# Patient Record
Sex: Female | Born: 1940 | Race: Black or African American | Hispanic: No | Marital: Single | Smoking: Never smoker
Health system: Southern US, Community
[De-identification: ages and names within clinical notes are randomized; demographics above are authoritative.]

## PROBLEM LIST (undated history)

## (undated) DIAGNOSIS — G4733 Obstructive sleep apnea (adult) (pediatric): Secondary | ICD-10-CM

## (undated) DIAGNOSIS — E119 Type 2 diabetes mellitus without complications: Secondary | ICD-10-CM

## (undated) DIAGNOSIS — M51369 Other intervertebral disc degeneration, lumbar region without mention of lumbar back pain or lower extremity pain: Secondary | ICD-10-CM

## (undated) DIAGNOSIS — B029 Zoster without complications: Secondary | ICD-10-CM

## (undated) DIAGNOSIS — I5030 Unspecified diastolic (congestive) heart failure: Secondary | ICD-10-CM

## (undated) DIAGNOSIS — E559 Vitamin D deficiency, unspecified: Secondary | ICD-10-CM

## (undated) DIAGNOSIS — I10 Essential (primary) hypertension: Secondary | ICD-10-CM

## (undated) DIAGNOSIS — N183 Chronic kidney disease, stage 3 unspecified: Secondary | ICD-10-CM

## (undated) DIAGNOSIS — I351 Nonrheumatic aortic (valve) insufficiency: Secondary | ICD-10-CM

## (undated) DIAGNOSIS — M109 Gout, unspecified: Secondary | ICD-10-CM

## (undated) DIAGNOSIS — D751 Secondary polycythemia: Secondary | ICD-10-CM

## (undated) DIAGNOSIS — E213 Hyperparathyroidism, unspecified: Secondary | ICD-10-CM

## (undated) DIAGNOSIS — G4730 Sleep apnea, unspecified: Secondary | ICD-10-CM

## (undated) DIAGNOSIS — M1990 Unspecified osteoarthritis, unspecified site: Secondary | ICD-10-CM

## (undated) DIAGNOSIS — I5189 Other ill-defined heart diseases: Secondary | ICD-10-CM

## (undated) DIAGNOSIS — E785 Hyperlipidemia, unspecified: Secondary | ICD-10-CM

## (undated) DIAGNOSIS — I340 Nonrheumatic mitral (valve) insufficiency: Secondary | ICD-10-CM

## (undated) DIAGNOSIS — R413 Other amnesia: Secondary | ICD-10-CM

## (undated) DIAGNOSIS — M47816 Spondylosis without myelopathy or radiculopathy, lumbar region: Secondary | ICD-10-CM

## (undated) DIAGNOSIS — M4310 Spondylolisthesis, site unspecified: Secondary | ICD-10-CM

## (undated) DIAGNOSIS — M5136 Other intervertebral disc degeneration, lumbar region: Secondary | ICD-10-CM

## (undated) DIAGNOSIS — D696 Thrombocytopenia, unspecified: Secondary | ICD-10-CM

## (undated) DIAGNOSIS — H269 Unspecified cataract: Secondary | ICD-10-CM

## (undated) HISTORY — DX: Vitamin D deficiency, unspecified: E55.9

## (undated) HISTORY — PX: LAPAROSCOPIC HYSTERECTOMY: SHX1926

## (undated) HISTORY — DX: Nonrheumatic aortic (valve) insufficiency: I35.1

## (undated) HISTORY — DX: Spondylosis without myelopathy or radiculopathy, lumbar region: M47.816

## (undated) HISTORY — DX: Zoster without complications: B02.9

## (undated) HISTORY — DX: Thrombocytopenia, unspecified: D69.6

## (undated) HISTORY — DX: Sleep apnea, unspecified: G47.30

## (undated) HISTORY — DX: Hyperlipidemia, unspecified: E78.5

## (undated) HISTORY — DX: Chronic kidney disease, stage 3 unspecified: N18.30

## (undated) HISTORY — DX: Secondary polycythemia: D75.1

## (undated) HISTORY — DX: Obstructive sleep apnea (adult) (pediatric): G47.33

## (undated) HISTORY — DX: Other ill-defined heart diseases: I51.89

## (undated) HISTORY — DX: Unspecified diastolic (congestive) heart failure: I50.30

## (undated) HISTORY — DX: Spondylolisthesis, site unspecified: M43.10

## (undated) HISTORY — DX: Other amnesia: R41.3

## (undated) HISTORY — DX: Other intervertebral disc degeneration, lumbar region without mention of lumbar back pain or lower extremity pain: M51.369

## (undated) HISTORY — DX: Nonrheumatic mitral (valve) insufficiency: I34.0

## (undated) HISTORY — DX: Unspecified osteoarthritis, unspecified site: M19.90

## (undated) HISTORY — DX: Hyperparathyroidism, unspecified: E21.3

## (undated) HISTORY — DX: Gout, unspecified: M10.9

## (undated) HISTORY — DX: Other intervertebral disc degeneration, lumbar region: M51.36

## (undated) HISTORY — DX: Unspecified cataract: H26.9

---

## 2008-02-09 LAB — HM COLONOSCOPY

## 2013-03-19 LAB — HM DEXA SCAN

## 2017-08-08 LAB — HM MAMMOGRAPHY

## 2017-11-28 LAB — HM DIABETES EYE EXAM

## 2018-03-24 LAB — BASIC METABOLIC PANEL
BUN: 17 (ref 4–21)
CO2: 27 — AB (ref 13–22)
Creatinine: 1 (ref 0.5–1.1)
Glucose: 89
Potassium: 4.2 (ref 3.4–5.3)
Sodium: 142 (ref 137–147)

## 2018-03-24 LAB — CBC: RBC: 4.21 (ref 3.87–5.11)

## 2018-03-24 LAB — COMPREHENSIVE METABOLIC PANEL
Albumin: 4.4 (ref 3.5–5.0)
Calcium: 9.9 (ref 8.7–10.7)

## 2018-03-24 LAB — LIPID PANEL
Cholesterol: 159 (ref 0–200)
HDL: 68 (ref 35–70)
LDL Cholesterol: 68
Triglycerides: 108 (ref 40–160)

## 2018-03-24 LAB — CBC AND DIFFERENTIAL
HCT: 39 (ref 36–46)
Hemoglobin: 13.3 (ref 12.0–16.0)
Neutrophils Absolute: 2
Platelets: 269 (ref 150–399)
WBC: 5.6

## 2018-03-24 LAB — HEPATIC FUNCTION PANEL
Alkaline Phosphatase: 82 (ref 25–125)
Bilirubin, Direct: 0.3 (ref 0.01–0.4)
Bilirubin, Total: 0.5

## 2018-03-24 LAB — MICROALBUMIN, URINE: Microalb, Ur: 15

## 2018-03-24 LAB — HEMOGLOBIN A1C: Hemoglobin A1C: 6.6

## 2018-11-03 LAB — BASIC METABOLIC PANEL
BUN: 27 — AB (ref 4–21)
CO2: 26 — AB (ref 13–22)
Creatinine: 1.4 — AB (ref 0.5–1.1)
Glucose: 155
Potassium: 4.4 (ref 3.4–5.3)
Sodium: 143 (ref 137–147)

## 2018-11-03 LAB — LIPID PANEL
Cholesterol: 161 (ref 0–200)
HDL: 66 (ref 35–70)
LDL Cholesterol: 72
Triglycerides: 89 (ref 40–160)

## 2018-11-03 LAB — CBC AND DIFFERENTIAL
HCT: 39 (ref 36–46)
HCT: 40 (ref 36–46)
Hemoglobin: 12.9 (ref 12.0–16.0)
Hemoglobin: 13.4 (ref 12.0–16.0)
Neutrophils Absolute: 2
Platelets: 252 (ref 150–399)
Platelets: 252 (ref 150–399)
WBC: 4.7
WBC: 4.7

## 2018-11-03 LAB — CBC
RBC: 4.17 (ref 3.87–5.11)
RBC: 4.17 (ref 3.87–5.11)

## 2018-11-03 LAB — HEMOGLOBIN A1C: Hemoglobin A1C: 6.7

## 2018-11-03 LAB — MICROALBUMIN, URINE: Microalb, Ur: 6.5

## 2018-11-03 LAB — COMPREHENSIVE METABOLIC PANEL
Calcium: 9.9 (ref 8.7–10.7)
GFR calc Af Amer: 45
GFR calc non Af Amer: 37

## 2019-06-29 ENCOUNTER — Encounter: Payer: Self-pay | Admitting: Family

## 2019-07-08 ENCOUNTER — Encounter (HOSPITAL_COMMUNITY): Payer: Self-pay

## 2019-07-08 ENCOUNTER — Emergency Department (HOSPITAL_COMMUNITY)
Admission: EM | Admit: 2019-07-08 | Discharge: 2019-07-08 | Disposition: A | Discharge status: Home / Self Care | Payer: Medicaid Other | Attending: Emergency Medicine | Admitting: Emergency Medicine

## 2019-07-08 ENCOUNTER — Emergency Department (HOSPITAL_COMMUNITY): Payer: Medicaid Other

## 2019-07-08 DIAGNOSIS — E119 Type 2 diabetes mellitus without complications: Secondary | ICD-10-CM | POA: Insufficient documentation

## 2019-07-08 DIAGNOSIS — T7840XA Allergy, unspecified, initial encounter: Secondary | ICD-10-CM

## 2019-07-08 DIAGNOSIS — R21 Rash and other nonspecific skin eruption: Secondary | ICD-10-CM | POA: Diagnosis present

## 2019-07-08 DIAGNOSIS — I10 Essential (primary) hypertension: Secondary | ICD-10-CM | POA: Diagnosis not present

## 2019-07-08 DIAGNOSIS — R0602 Shortness of breath: Secondary | ICD-10-CM | POA: Insufficient documentation

## 2019-07-08 HISTORY — DX: Type 2 diabetes mellitus without complications: E11.9

## 2019-07-08 HISTORY — DX: Essential (primary) hypertension: I10

## 2019-07-08 LAB — CBC WITH DIFFERENTIAL/PLATELET
Abs Immature Granulocytes: 0.03 10*3/uL (ref 0.00–0.07)
Basophils Absolute: 0 10*3/uL (ref 0.0–0.1)
Basophils Relative: 0 %
Eosinophils Absolute: 0.3 10*3/uL (ref 0.0–0.5)
Eosinophils Relative: 3 %
HCT: 39.6 % (ref 36.0–46.0)
Hemoglobin: 12.9 g/dL (ref 12.0–15.0)
Immature Granulocytes: 0 %
Lymphocytes Relative: 22 %
Lymphs Abs: 2.2 10*3/uL (ref 0.7–4.0)
MCH: 31.1 pg (ref 26.0–34.0)
MCHC: 32.6 g/dL (ref 30.0–36.0)
MCV: 95.4 fL (ref 80.0–100.0)
Monocytes Absolute: 0.7 10*3/uL (ref 0.1–1.0)
Monocytes Relative: 7 %
Neutro Abs: 7.2 10*3/uL (ref 1.7–7.7)
Neutrophils Relative %: 68 %
Platelets: 283 10*3/uL (ref 150–400)
RBC: 4.15 MIL/uL (ref 3.87–5.11)
RDW: 12.4 % (ref 11.5–15.5)
WBC: 10.4 10*3/uL (ref 4.0–10.5)
nRBC: 0 % (ref 0.0–0.2)

## 2019-07-08 LAB — BASIC METABOLIC PANEL
Anion gap: 9 (ref 5–15)
BUN: 31 mg/dL — ABNORMAL HIGH (ref 8–23)
CO2: 22 mmol/L (ref 22–32)
Calcium: 9.3 mg/dL (ref 8.9–10.3)
Chloride: 111 mmol/L (ref 98–111)
Creatinine, Ser: 1.28 mg/dL — ABNORMAL HIGH (ref 0.44–1.00)
GFR calc Af Amer: 45 mL/min — ABNORMAL LOW (ref >60)
GFR calc non Af Amer: 39 mL/min — ABNORMAL LOW (ref >60)
Glucose, Bld: 148 mg/dL — ABNORMAL HIGH (ref 70–99)
Potassium: 4.5 mmol/L (ref 3.5–5.1)
Sodium: 142 mmol/L (ref 135–145)

## 2019-07-08 LAB — BRAIN NATRIURETIC PEPTIDE: B Natriuretic Peptide: 31.5 pg/mL (ref 0.0–100.0)

## 2019-07-08 MED ORDER — DIPHENHYDRAMINE HCL 50 MG/ML IJ SOLN
25.0000 mg | Freq: Once | INTRAMUSCULAR | Status: AC
  Administered 2019-07-08: 25 mg via INTRAVENOUS
  Filled 2019-07-08: qty 1

## 2019-07-08 MED ORDER — METHYLPREDNISOLONE SODIUM SUCC 125 MG IJ SOLR
125.0000 mg | Freq: Once | INTRAMUSCULAR | Status: AC
  Administered 2019-07-08: 125 mg via INTRAVENOUS
  Filled 2019-07-08: qty 2

## 2019-07-08 MED ORDER — SODIUM CHLORIDE 0.9 % IV SOLN: INTRAVENOUS | Status: DC

## 2019-07-08 MED ORDER — FAMOTIDINE IN NACL 20-0.9 MG/50ML-% IV SOLN
20.0000 mg | Freq: Once | INTRAVENOUS | Status: AC
  Administered 2019-07-08: 20 mg via INTRAVENOUS
  Filled 2019-07-08: qty 50

## 2019-07-08 MED ORDER — DIPHENHYDRAMINE HCL 50 MG/ML IJ SOLN
12.5000 mg | Freq: Once | INTRAMUSCULAR | Status: AC
  Administered 2019-07-08: 12.5 mg via INTRAVENOUS
  Filled 2019-07-08: qty 1

## 2019-07-08 MED ORDER — ACETAMINOPHEN 325 MG PO TABS
650.0000 mg | ORAL_TABLET | Freq: Once | ORAL | Status: AC
  Administered 2019-07-08: 650 mg via ORAL
  Filled 2019-07-08: qty 2

## 2019-07-08 MED ORDER — PREDNISONE 50 MG PO TABS
ORAL_TABLET | ORAL | 0 refills | Status: DC
Start: 2019-07-08 — End: 2019-08-06

## 2019-07-08 NOTE — Discharge Instructions (Signed)
Use Benadryl as directed for itching.  Stop taking the Augmentin.

## 2019-07-08 NOTE — ED Triage Notes (Addendum)
Pt sts allergic reaction to unknown substance. Worse on back and burning. Only allergy to shellfish. Hx of shingles.

## 2019-07-08 NOTE — ED Provider Notes (Signed)
[facility] DEPT Provider Note   CSN: [id number] Arrival date & time: 07/08/19  2029     History Chief Complaint  Patient presents with  . Allergic Reaction    Michelle Rowe is a 79 y.o. female.  79 year old female presents with rash x24 hours.  Patient was started on Augmentin 8 days ago for respiratory infection.  Denies any oral involvement.  Rash is pruritic and does burn.  No treatment use prior to arrival.  No other chemicals used.  Rashes on her face as well as her trunk and extremities.  No involvement of the palms of her hands or soles of her feet.  No prior history of same        Past Medical History:  Diagnosis Date  . Diabetes mellitus without complication ([place])   . Hypertension     There are no problems to display for this patient.      OB History   No obstetric history on file.     No family history on file.  Social History   Tobacco Use  . Smoking status: Never Smoker  . Smokeless tobacco: Never Used  Substance Use Topics  . Alcohol use: Not Currently  . Drug use: Not Currently    Home Medications Prior to Admission medications   Not on File    Allergies    Shellfish allergy  Review of Systems   Review of Systems  All other systems reviewed and are negative.   Physical Exam Updated Vital Signs BP (!) 140/92 (BP Location: Right Arm)   Pulse 70   Temp 99.2 F (37.3 C) (Oral)   Resp 18   Ht 1.651 m (5\' 5" )   Wt 104.3 kg   SpO2 98%   BMI 38.27 kg/m   Physical Exam Vitals and nursing note reviewed.  Constitutional:      General: She is not in acute distress.    Appearance: Normal appearance. She is well-developed. She is not toxic-appearing.  HENT:     Head: Normocephalic and atraumatic.  Eyes:     General: Lids are normal.     Conjunctiva/sclera: Conjunctivae normal.     Pupils: Pupils are equal, round, and reactive to light.  Neck:     Thyroid: No thyroid mass.     Trachea: No tracheal  deviation.  Cardiovascular:     Rate and Rhythm: Normal rate and regular rhythm.     Heart sounds: Normal heart sounds. No murmur. No gallop.   Pulmonary:     Effort: Pulmonary effort is normal. No respiratory distress.     Breath sounds: Normal breath sounds. No stridor. No decreased breath sounds, wheezing, rhonchi or rales.  Abdominal:     General: Bowel sounds are normal. There is no distension.     Palpations: Abdomen is soft.     Tenderness: There is no abdominal tenderness. There is no rebound.  Musculoskeletal:        General: No tenderness. Normal range of motion.     Cervical back: Normal range of motion and neck supple.  Skin:    General: Skin is warm and dry.     Findings: Rash present. No abrasion or petechiae. Rash is urticarial. Rash is not vesicular.  Neurological:     Mental Status: She is alert and oriented to person, place, and time.     GCS: GCS eye subscore is 4. GCS verbal subscore is 5. GCS motor subscore is 6.     Cranial Nerves:  No cranial nerve deficit.     Sensory: No sensory deficit.  Psychiatric:        Speech: Speech normal.        Behavior: Behavior normal.     ED Results / Procedures / Treatments   Labs (all labs ordered are listed, but only abnormal results are displayed) Labs Reviewed  CBC WITH DIFFERENTIAL/PLATELET  BASIC METABOLIC PANEL  BRAIN NATRIURETIC PEPTIDE    EKG None  Radiology No results found.  Procedures Procedures (including critical care time)  Medications Ordered in ED Medications  0.9 %  sodium chloride infusion (has no administration in time range)  diphenhydrAMINE (BENADRYL) injection 25 mg (has no administration in time range)  methylPREDNISolone sodium succinate (SOLU-MEDROL) 125 mg/2 mL injection 125 mg (has no administration in time range)  famotidine (PEPCID) IVPB 20 mg premix (has no administration in time range)    ED Course  I have reviewed the triage vital signs and the nursing notes.  Pertinent  labs & imaging results that were available during my care of the patient were reviewed by me and considered in my medical decision making (see chart for details).    MDM Rules/Calculators/A&P                      Patient treated for allergic reaction here and feels better.  Rash is greatly improved.  No evidence of decompensated CHF at this time.  BMP is normal.  Patient is Augmentin likely etiology of her symptoms and that has been discontinued.  No indication to restart a different antibiotic as she has no respiratory complaints at this time.  Will scribe 2 more days of steroids as well as encourage patient use as needed Benadryl and Pepcid.  Will give primary care referral.  CRITICAL CARE Performed by: Leota Jacobsen Total critical care time: 50 minutes Critical care time was exclusive of separately billable procedures and treating other patients. Critical care was necessary to treat or prevent imminent or life-threatening deterioration. Critical care was time spent personally by me on the following activities: development of treatment plan with patient and/or surrogate as well as nursing, discussions with consultants, evaluation of patient's response to treatment, examination of patient, obtaining history from patient or surrogate, ordering and performing treatments and interventions, ordering and review of laboratory studies, ordering and review of radiographic studies, pulse oximetry and re-evaluation of patient's condition.  Final Clinical Impression(s) / ED Diagnoses Final diagnoses:  None    Rx / DC Orders ED Discharge Orders    None       Lacretia Leigh, MD 07/08/19 2315

## 2019-07-14 ENCOUNTER — Encounter: Payer: Self-pay | Admitting: Family

## 2019-07-14 LAB — COMPREHENSIVE METABOLIC PANEL
Albumin: 3.7 (ref 3.5–5.0)
Calcium: 9.9 (ref 8.7–10.7)
GFR calc Af Amer: 50
GFR calc non Af Amer: 41

## 2019-07-14 LAB — LIPID PANEL
Cholesterol: 154 (ref 0–200)
HDL: 75 — AB (ref 35–70)
LDL Cholesterol: 60
Triglycerides: 74 (ref 40–160)

## 2019-07-14 LAB — CBC AND DIFFERENTIAL
HCT: 40 (ref 36–46)
Hemoglobin: 13.4 (ref 12.0–16.0)
Neutrophils Absolute: 5
Platelets: 304 (ref 150–399)
WBC: 9.6

## 2019-07-14 LAB — BASIC METABOLIC PANEL
BUN: 35 — AB (ref 4–21)
CO2: 24 — AB (ref 13–22)
Chloride: 107 (ref 99–108)
Creatinine: 1.3 — AB (ref 0.5–1.1)
Glucose: 122
Potassium: 4.7 (ref 3.4–5.3)
Sodium: 141 (ref 137–147)

## 2019-07-14 LAB — HEPATIC FUNCTION PANEL
Alkaline Phosphatase: 78 (ref 25–125)
Bilirubin, Direct: 0 — AB (ref 0.01–0.4)
Bilirubin, Total: 0.5

## 2019-07-14 LAB — CBC: RBC: 4.4 (ref 3.87–5.11)

## 2019-07-14 LAB — MICROALBUMIN, URINE: Microalb, Ur: 7.9

## 2019-08-06 ENCOUNTER — Encounter: Payer: Self-pay | Admitting: Podiatry

## 2019-08-06 ENCOUNTER — Ambulatory Visit (INDEPENDENT_AMBULATORY_CARE_PROVIDER_SITE_OTHER): Payer: Medicaid Other

## 2019-08-06 ENCOUNTER — Ambulatory Visit (INDEPENDENT_AMBULATORY_CARE_PROVIDER_SITE_OTHER): Payer: Medicaid Other | Admitting: Podiatry

## 2019-08-06 ENCOUNTER — Other Ambulatory Visit: Payer: Self-pay

## 2019-08-06 ENCOUNTER — Ambulatory Visit: Payer: Self-pay

## 2019-08-06 DIAGNOSIS — M7751 Other enthesopathy of right foot: Secondary | ICD-10-CM

## 2019-08-06 DIAGNOSIS — E139 Other specified diabetes mellitus without complications: Secondary | ICD-10-CM | POA: Diagnosis not present

## 2019-08-06 DIAGNOSIS — M779 Enthesopathy, unspecified: Secondary | ICD-10-CM

## 2019-08-06 DIAGNOSIS — M7752 Other enthesopathy of left foot: Secondary | ICD-10-CM

## 2019-08-06 DIAGNOSIS — R52 Pain, unspecified: Secondary | ICD-10-CM | POA: Diagnosis not present

## 2019-08-06 NOTE — Patient Instructions (Signed)
Diabetes Mellitus and Foot Care Foot care is an important part of your health, especially when you have diabetes. Diabetes may cause you to have problems because of poor blood flow (circulation) to your feet and legs, which can cause your skin to:  Become thinner and drier.  Break more easily.  Heal more slowly.  Peel and crack. You may also have nerve damage (neuropathy) in your legs and feet, causing decreased feeling in them. This means that you may not notice minor injuries to your feet that could lead to more serious problems. Noticing and addressing any potential problems early is the best way to prevent future foot problems. How to care for your feet Foot hygiene  Wash your feet daily with warm water and mild soap. Do not use hot water. Then, pat your feet and the areas between your toes until they are completely dry. Do not soak your feet as this can dry your skin.  Trim your toenails straight across. Do not dig under them or around the cuticle. File the edges of your nails with an emery board or nail file.  Apply a moisturizing lotion or petroleum jelly to the skin on your feet and to dry, brittle toenails. Use lotion that does not contain alcohol and is unscented. Do not apply lotion between your toes. Shoes and socks  Wear clean socks or stockings every day. Make sure they are not too tight. Do not wear knee-high stockings since they may decrease blood flow to your legs.  Wear shoes that fit properly and have enough cushioning. Always look in your shoes before you put them on to be sure there are no objects inside.  To break in new shoes, wear them for just a few hours a day. This prevents injuries on your feet. Wounds, scrapes, corns, and calluses  Check your feet daily for blisters, cuts, bruises, sores, and redness. If you cannot see the bottom of your feet, use a mirror or ask someone for help.  Do not cut corns or calluses or try to remove them with medicine.  If you  find a minor scrape, cut, or break in the skin on your feet, keep it and the skin around it clean and dry. You may clean these areas with mild soap and water. Do not clean the area with peroxide, alcohol, or iodine.  If you have a wound, scrape, corn, or callus on your foot, look at it several times a day to make sure it is healing and not infected. Check for: ? Redness, swelling, or pain. ? Fluid or blood. ? Warmth. ? Pus or a bad smell. General instructions  Do not cross your legs. This may decrease blood flow to your feet.  Do not use heating pads or hot water bottles on your feet. They may burn your skin. If you have lost feeling in your feet or legs, you may not know this is happening until it is too late.  Protect your feet from hot and cold by wearing shoes, such as at the beach or on hot pavement.  Schedule a complete foot exam at least once a year (annually) or more often if you have foot problems. If you have foot problems, report any cuts, sores, or bruises to your health care provider immediately. Contact a health care provider if:  You have a medical condition that increases your risk of infection and you have any cuts, sores, or bruises on your feet.  You have an injury that is not   healing.  You have redness on your legs or feet.  You feel burning or tingling in your legs or feet.  You have pain or cramps in your legs and feet.  Your legs or feet are numb.  Your feet always feel cold.  You have pain around a toenail. Get help right away if:  You have a wound, scrape, corn, or callus on your foot and: ? You have pain, swelling, or redness that gets worse. ? You have fluid or blood coming from the wound, scrape, corn, or callus. ? Your wound, scrape, corn, or callus feels warm to the touch. ? You have pus or a bad smell coming from the wound, scrape, corn, or callus. ? You have a fever. ? You have a red line going up your leg. Summary  Check your feet every day  for cuts, sores, red spots, swelling, and blisters.  Moisturize feet and legs daily.  Wear shoes that fit properly and have enough cushioning.  If you have foot problems, report any cuts, sores, or bruises to your health care provider immediately.  Schedule a complete foot exam at least once a year (annually) or more often if you have foot problems. This information is not intended to replace advice given to you by your health care provider. Make sure you discuss any questions you have with your health care provider. Document Revised: 10/18/2018 Document Reviewed: 02/27/2016 Elsevier Patient Education  2020 [employer]  

## 2019-08-06 NOTE — Progress Notes (Signed)
   Subjective:    Patient ID: Michelle Rowe, female    DOB: 1940/07/08, 79 y.o.   MRN: [id number]  HPI    Review of Systems  All other systems reviewed and are negative.      Objective:   Physical Exam        Assessment & Plan:

## 2019-08-07 ENCOUNTER — Other Ambulatory Visit: Payer: Self-pay | Admitting: Podiatry

## 2019-08-07 DIAGNOSIS — M779 Enthesopathy, unspecified: Secondary | ICD-10-CM

## 2019-08-09 NOTE — Progress Notes (Signed)
Subjective:   Patient ID: Michelle Rowe, female   DOB: 79 y.o.   MRN: [id number]   HPI patient presents stating I had quite a bit of pain in my ankles of both feet and it makes it hard for me to be comfortable.  I do get some pain in my heels I get some dry skin and my nails are thick and I do have diabetes under reasonably good control.  Patient does not currently smoke and would like to be more active    Review of Systems  All other systems reviewed and are negative.       Objective:  Physical Exam Vitals and nursing note reviewed.  Constitutional:      Appearance: She is well-developed.  Pulmonary:     Effort: Pulmonary effort is normal.  Musculoskeletal:        General: Normal range of motion.  Skin:    General: Skin is warm.  Neurological:     Mental Status: She is alert.     Neurovascular status intact muscle strength was found to be moderately diminished with range of motion diminished subtalar midtarsal joint.  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

## 2019-08-21 ENCOUNTER — Ambulatory Visit: Payer: Medicaid Other | Admitting: Orthotics

## 2019-09-12 ENCOUNTER — Other Ambulatory Visit: Payer: Self-pay | Admitting: Geriatric Medicine

## 2019-09-12 DIAGNOSIS — Z1231 Encounter for screening mammogram for malignant neoplasm of breast: Secondary | ICD-10-CM

## 2019-09-21 ENCOUNTER — Encounter: Payer: Self-pay | Admitting: Family

## 2019-09-21 ENCOUNTER — Ambulatory Visit
Admission: RE | Admit: 2019-09-21 | Discharge: 2019-09-21 | Disposition: A | Discharge status: Home / Self Care | Payer: Medicaid Other | Source: Ambulatory Visit | Attending: Geriatric Medicine | Admitting: Geriatric Medicine

## 2019-09-21 ENCOUNTER — Ambulatory Visit (INDEPENDENT_AMBULATORY_CARE_PROVIDER_SITE_OTHER): Payer: Medicaid Other | Admitting: Family

## 2019-09-21 ENCOUNTER — Ambulatory Visit
Admission: RE | Admit: 2019-09-21 | Discharge: 2019-09-21 | Disposition: A | Discharge status: Home / Self Care | Payer: Medicaid Other | Source: Ambulatory Visit | Attending: Family | Admitting: Family

## 2019-09-21 ENCOUNTER — Other Ambulatory Visit: Payer: Self-pay

## 2019-09-21 VITALS — BP 138/86 | HR 63 | Temp 96.8°F | Resp 18 | Ht 64.0 in | Wt 216.0 lb

## 2019-09-21 DIAGNOSIS — Z1231 Encounter for screening mammogram for malignant neoplasm of breast: Secondary | ICD-10-CM

## 2019-09-21 DIAGNOSIS — E1121 Type 2 diabetes mellitus with diabetic nephropathy: Secondary | ICD-10-CM

## 2019-09-21 DIAGNOSIS — E785 Hyperlipidemia, unspecified: Secondary | ICD-10-CM | POA: Diagnosis not present

## 2019-09-21 DIAGNOSIS — R413 Other amnesia: Secondary | ICD-10-CM

## 2019-09-21 DIAGNOSIS — I110 Hypertensive heart disease with heart failure: Secondary | ICD-10-CM

## 2019-09-21 DIAGNOSIS — I5032 Chronic diastolic (congestive) heart failure: Secondary | ICD-10-CM

## 2019-09-21 DIAGNOSIS — G8929 Other chronic pain: Secondary | ICD-10-CM

## 2019-09-21 DIAGNOSIS — M159 Polyosteoarthritis, unspecified: Secondary | ICD-10-CM

## 2019-09-21 DIAGNOSIS — R399 Unspecified symptoms and signs involving the genitourinary system: Secondary | ICD-10-CM

## 2019-09-21 DIAGNOSIS — M8949 Other hypertrophic osteoarthropathy, multiple sites: Secondary | ICD-10-CM

## 2019-09-21 DIAGNOSIS — M25561 Pain in right knee: Secondary | ICD-10-CM

## 2019-09-21 DIAGNOSIS — N1831 Chronic kidney disease, stage 3a: Secondary | ICD-10-CM

## 2019-09-21 LAB — POCT URINALYSIS DIPSTICK
Bilirubin, UA: NEGATIVE
Blood, UA: NEGATIVE
Glucose, UA: NEGATIVE
Ketones, UA: NEGATIVE
Nitrite, UA: NEGATIVE
Protein, UA: NEGATIVE
Spec Grav, UA: 1.01 (ref 1.010–1.025)
Urobilinogen, UA: 0.2 E.U./dL
pH, UA: 6 (ref 5.0–8.0)

## 2019-09-21 MED ORDER — LANCETS ULTRA THIN 30G MISC: 1.0000 | Freq: Every day | 3 refills | Status: DC

## 2019-09-21 MED ORDER — LORATADINE 10 MG PO TABS
10.0000 mg | ORAL_TABLET | Freq: Every day | ORAL | 11 refills | Status: DC
Start: 2019-09-21 — End: 2019-12-06

## 2019-09-21 MED ORDER — GLUCOSE BLOOD VI STRP: 1.0000 | ORAL_STRIP | 3 refills | Status: DC | PRN

## 2019-09-21 NOTE — Patient Instructions (Signed)
[facility] for right knee X-ray

## 2019-09-21 NOTE — Progress Notes (Signed)
Provider: Marlowe Sax FNP-C   Eivan Gallina, [name], NP  Patient Care Team: Darvell Monteforte, [name], NP as PCP - General (Family Medicine)  Extended Emergency Contact Information Primary Emergency Contact: [place], [place] Home Phone: [phone] Mobile Phone: [phone] Relation: Daughter Secondary Emergency Contact: [place], [place] Phone: [phone] Mobile Phone: [phone] Relation: Daughter  Code Status: Full code  Goals of care: Advanced Directive information Advanced Directives 09/21/2019  Does Patient Have a Medical Advance Directive? No  Would patient like information on creating a medical advance directive? No - Patient declined     Chief Complaint  Patient presents with   Establish Care    New Patient.     HPI:  Pt is a 79 y.o. female seen today to establish care for medical management of chronic diseases.she is here with daughter who provides HPI information.she hardly filled out the admission packet with medical history.additional information obtained from available medical records.she has a medical history of Hypertension,Type 2 Diabetes Mellitus,diastolic Congestive Heart Failure EF 60 -65% ,Hyperlipidemia,CKD stage 3,Hyperparathyroid,DDD,Osteoarthritis right hip,right knee gout,Memory loss ,Mitral valve regurgitation among other conditions.   She complains of cough for about one week.Has had some occasional chest pain which does not last long.pain does not radiate to arm,jaw or back when she had it .No recent chest pain reported.she denies any fever,chills, shortness of breath,wheezing,edema or recent contact with sick person with COVID-19.she has completed her two doses of Moderna vaccine.  Also complains of burning and frequent urination.daughter states has strong urine odor.she does not drink enough water though daughter not sure how much she should drink due to her congestive heart failure.    Daughter more concerned of patient's worsening memory loss.States  patient was forgetting to take her medication ,misplaces her things then will blame on other people saying they still her staff.she argues with daughter.No wondering or aggressive behaviors reported.she does own activity of daily living except family assist with cooking.States had an MRI with Neurologist 2 months ago.No records for review.Daughter would like her to establish care with Neurologist here in [place].No fall episode reported.  Has not checked her blood sugars daughter recalls CBG in the 110's the last time she checked.she request refills for lancets and strips. Daughter will take charge of medication management and CBG monitoring.Follows up with podiatrist gets injection.she due for annual eye exam.Need referral to Ophthalmology.  No home blood pressure readings for review.   Past Medical History:  Diagnosis Date   Anterolisthesis    Grade 1 of L3 on L4 on x-ray 07/18/19   Aortic regurgitation    Seen on 2D echo 07/07/16   CKD (chronic kidney disease), stage III    DDD (degenerative disc disease), lumbar    Diabetes mellitus without complication (HCC)    Diastolic CHF (HCC)    Grade 2/4 with LVEF of 39-76%   Diastolic dysfunction    Grade 2/4 with LVEF of 60-65% and LVH   Gout    HLD (hyperlipidemia)    Hyperparathyroidism (HCC)    Hypertension    Lumbar facet arthropathy    Memory impairment    Mitral valve regurgitation    Seen on 2D echo 07/07/16   OA (osteoarthritis)    OSA (obstructive sleep apnea)    Polycythemia    Shingles    Thrombocytopenia (HCC)    Vitamin D deficiency    Past Surgical History:  Procedure Laterality Date   LAPAROSCOPIC HYSTERECTOMY      Allergies  Allergen Reactions   Augmentin [Amoxicillin-Pot Clavulanate]  Rash    Critically high - Onset 07/13/2019   Amoxicillin     Did it involve swelling of the face/tongue/throat, SOB, or low BP? Y Did it involve sudden or severe rash/hives, skin peeling, or any reaction on the  inside of your mouth or nose? Y (Rash, Swelling, SOB) Did you need to seek medical attention at a hospital or doctor's office? Y When did it last happen?Yesterday If all above answers are NO, may proceed with cephalosporin use.    Shellfish Allergy     Allergies as of 09/21/2019      Reactions   Augmentin [amoxicillin-pot Clavulanate] Rash   Critically high - Onset 07/13/2019   Amoxicillin    Did it involve swelling of the face/tongue/throat, SOB, or low BP? Y Did it involve sudden or severe rash/hives, skin peeling, or any reaction on the inside of your mouth or nose? Y (Rash, Swelling, SOB) Did you need to seek medical attention at a hospital or doctor's office? Y When did it last happen?Yesterday If all above answers are NO, may proceed with cephalosporin use.   Shellfish Allergy       Medication List       Accurate as of September 21, 2019  2:05 PM. If you have any questions, ask your nurse or doctor.        allopurinol 300 MG tablet Commonly known as: ZYLOPRIM Take 300 mg by mouth daily.   amLODipine 10 MG tablet Commonly known as: NORVASC Take 10 mg by mouth daily.   aspirin 81 MG chewable tablet Chew 81 mg by mouth daily.   fluticasone 50 MCG/ACT nasal spray Commonly known as: FLONASE Place 1 spray into both nostrils 2 (two) times daily as needed for allergies.   glipiZIDE 5 MG tablet Commonly known as: GLUCOTROL Take 5 mg by mouth daily.   glucose blood test strip 1 each by Other route as needed for other. Use as instructed   hydrALAZINE 25 MG tablet Commonly known as: APRESOLINE Take 25 mg by mouth 3 (three) times daily.   Lancets Ultra Thin 30G Misc Inject 1 each into the skin daily.   lisinopril 20 MG tablet Commonly known as: ZESTRIL Take 20 mg by mouth 2 (two) times daily.   metFORMIN 500 MG tablet Commonly known as: GLUCOPHAGE Take 500 mg by mouth daily.   multivitamin capsule Take 1 capsule by mouth daily.   simvastatin 20  MG tablet Commonly known as: ZOCOR Take 20 mg by mouth daily at 6 PM.       Review of Systems  Constitutional: Negative for appetite change, chills, fatigue and fever.  HENT: Positive for congestion and rhinorrhea. Negative for hearing loss, sinus pressure, sinus pain, sneezing, sore throat and trouble swallowing.   Eyes: Negative for discharge, redness and itching.  Respiratory: Positive for cough. Negative for chest tightness, shortness of breath and wheezing.   Cardiovascular: Negative for chest pain, palpitations and leg swelling.  Gastrointestinal: Negative for abdominal distention, abdominal pain, constipation, diarrhea, nausea and vomiting.  Endocrine: Negative for cold intolerance, heat intolerance, polydipsia, polyphagia and polyuria.  Genitourinary: Positive for frequency. Negative for difficulty urinating, dysuria, flank pain and urgency.       Burning with urination and strong urine odor  Musculoskeletal: Positive for arthralgias. Negative for gait problem and joint swelling.       Right knee pain with hx arthritis and gout affecting her walking   Skin: Negative for color change, pallor and rash.  Neurological: Negative for  dizziness, speech difficulty, weakness, light-headedness, numbness and headaches.  Hematological: Does not bruise/bleed easily.  Psychiatric/Behavioral: Positive for confusion. Negative for agitation, hallucinations and sleep disturbance. The patient is not nervous/anxious.     Immunization History  Administered Date(s) Administered   Influenza-Unspecified 10/31/2018   Moderna SARS-COVID-2 Vaccination 04/25/2019, 05/23/2019   Pneumococcal Conjugate-13 05/06/2015   Pneumococcal Polysaccharide-23 10/30/2013   Tdap 12/31/2013   Pertinent  Health Maintenance Due  Topic Date Due   INFLUENZA VACCINE  09/09/2019   DEXA SCAN  Completed   PNA vac Low Risk Adult  Completed   Fall Risk  09/21/2019  Falls in the past year? 0  Number falls in past  yr: 0  Injury with Fall? 0    Vitals:   09/21/19 1341  BP: 138/86  Pulse: 63  Resp: 18  Temp: (!) 96.8 F (36 C)  SpO2: 98%  Weight: 216 lb (98 kg)  Height: 5' 4"  (1.626 m)   Body mass index is 37.08 kg/m. Physical Exam Vitals reviewed.  Constitutional:      General: She is not in acute distress.    Appearance: She is obese. She is not ill-appearing.  HENT:     Head: Normocephalic.     Right Ear: Tympanic membrane, ear canal and external ear normal. There is no impacted cerumen.     Left Ear: Tympanic membrane, ear canal and external ear normal. There is no impacted cerumen.     Nose: Nose normal. No congestion or rhinorrhea.     Mouth/Throat:     Mouth: Mucous membranes are moist.     Pharynx: Oropharynx is clear. No oropharyngeal exudate or posterior oropharyngeal erythema.  Eyes:     General: No scleral icterus.       Right eye: No discharge.        Left eye: No discharge.     Extraocular Movements: Extraocular movements intact.     Conjunctiva/sclera: Conjunctivae normal.     Pupils: Pupils are equal, round, and reactive to light.  Neck:     Vascular: No carotid bruit.  Cardiovascular:     Rate and Rhythm: Normal rate and regular rhythm.     Pulses: Normal pulses.     Heart sounds: Normal heart sounds. No murmur heard.  No friction rub. No gallop.   Pulmonary:     Effort: Pulmonary effort is normal. No respiratory distress.     Breath sounds: Normal breath sounds. No wheezing, rhonchi or rales.  Chest:     Chest wall: No tenderness.  Abdominal:     General: Bowel sounds are normal. There is no distension.     Palpations: Abdomen is soft. There is no mass.     Tenderness: There is no abdominal tenderness. There is no right CVA tenderness, left CVA tenderness, guarding or rebound.  Musculoskeletal:        General: No swelling or tenderness.     Cervical back: Normal range of motion. No rigidity or tenderness.     Right lower leg: No edema.     Left lower  leg: No edema.  Lymphadenopathy:     Cervical: No cervical adenopathy.  Skin:    General: Skin is warm and dry.     Coloration: Skin is not pale.     Findings: No bruising, erythema or rash.  Neurological:     Mental Status: She is alert.     Cranial Nerves: No cranial nerve deficit.     Sensory: No sensory deficit.  Motor: No weakness.     Coordination: Coordination normal.     Gait: Gait normal.  Psychiatric:        Mood and Affect: Mood normal.        Speech: Speech normal.        Behavior: Behavior normal.        Thought Content: Thought content normal.        Cognition and Memory: Memory is impaired.    Labs reviewed: Recent Labs    11/03/18 0000 07/08/19 2120 07/14/19 0000  NA 143 142 141  K 4.4 4.5 4.7  CL  --  111 107  CO2 26* 22 24*  GLUCOSE  --  148*  --   BUN 27* 31* 35*  CREATININE 1.4* 1.28* 1.3*  CALCIUM 9.9 9.3 9.9   Recent Labs    07/14/19 0000  ALKPHOS 78  ALBUMIN 3.7   Recent Labs    11/03/18 0000 07/08/19 2120 07/14/19 0000  WBC 4.7   4.7 10.4 9.6  NEUTROABS 2 7.2 5  HGB 12.9   13.4 12.9 13.4  HCT 39   40 39.6 40  MCV  --  95.4  --   PLT 252   252 283 304   No results found for: TSH Lab Results  Component Value Date   HGBA1C 6.7 11/03/2018   Lab Results  Component Value Date   CHOL 154 07/14/2019   HDL 75 (A) 07/14/2019   LDLCALC 60 07/14/2019   TRIG 74 07/14/2019    Significant Diagnostic Results in last 30 days:  No results found.  Assessment/Plan 1. Type 2 diabetes mellitus with stage 3a chronic kidney disease, without long-term current use of insulin (HCC) Lab Results  Component Value Date   HGBA1C 6.7 11/03/2018  Requires refill for lancet and strips.No recent CBG daughter recalls previous 110's. - continue on metformin 500 mg tablet daily On ASA and Statin  -  Continue on ACE inhibitor for renal protection. - continue to follow up with Podiatrist for foot care - will need follow up with Ophthalmology for  annual eye exam.  - TSH; Future - Hemoglobin A1c; Future  2. Chronic diastolic congestive heart failure (HCC) No signs of fluid overload. Not on diuretic.  3. Hyperlipidemia LDL goal <70 Continue on simvastatin 20 mg tablet daily  - Lipid panel; Future  4. Primary osteoarthritis involving multiple joints Reports worsening right knee pain.No erythema or effusion noted.  - DG Knee Complete 4 Views Right; Future  5. Hypertensive heart disease with heart failure (HCC) B/p at goal. - continue on amlodipine and lisinopril - continue on ASA and statin  - EKG 12-Lead shows Sinus rhythm with HR 61 b/min no change from previous EKG.  - CBC with Differential/Platelet; Future - CMP with eGFR([facility]); Future  6. Memory loss Worsening memory loss.Daughter request to establish with Neurology.had MRI in the past no records for review.  - Vitamin B12; Future - Ambulatory referral to Neurology  7. Chronic pain of right knee Advised to take OTC Tylenol 500 mg tablet every 6 Hrs as needed for pain. - DG Knee Complete 4 Views Right; Future - advised to get right knee X-ray done at [facility] then will call with results.    8. Symptoms of urinary tract infection Afebrile.Reports increased urine frequency,burning with urination and strong urine odor. - encouraged to increase fluid intake 6-8 glasses daily.  - POC Urinalysis Dipstick shows yellow clear urine,negative for RBC,Nitrite and positive  for 2+ Leukocytes. Will send for culture.   - Culture, Urine  Family/ staff Communication: Reviewed plan of care with patient and daughter.  Labs/tests ordered:  - TSH; Future - Hemoglobin A1c; Future - EKG 12-Lead - CBC with Differential/Platelet; Future - CMP with eGFR([facility]); Future - Vitamin B12; Future - Culture, Urine - DG Knee Complete 4 Views Right; Future  Next Appointment : 3 months for medical management of chronic issues.Labs in 2-4 days or sooner.    Sandrea Hughs, NP

## 2019-09-22 LAB — URINE CULTURE
MICRO NUMBER:: 10825835
SPECIMEN QUALITY:: ADEQUATE

## 2019-09-24 ENCOUNTER — Other Ambulatory Visit: Payer: Self-pay | Admitting: Family

## 2019-09-24 DIAGNOSIS — M25561 Pain in right knee: Secondary | ICD-10-CM

## 2019-09-24 DIAGNOSIS — G8929 Other chronic pain: Secondary | ICD-10-CM

## 2019-09-26 ENCOUNTER — Other Ambulatory Visit: Payer: Medicaid Other

## 2019-09-26 ENCOUNTER — Other Ambulatory Visit: Payer: Self-pay

## 2019-09-26 DIAGNOSIS — R413 Other amnesia: Secondary | ICD-10-CM

## 2019-09-26 DIAGNOSIS — E1122 Type 2 diabetes mellitus with diabetic chronic kidney disease: Secondary | ICD-10-CM

## 2019-09-26 DIAGNOSIS — E785 Hyperlipidemia, unspecified: Secondary | ICD-10-CM

## 2019-09-26 DIAGNOSIS — I110 Hypertensive heart disease with heart failure: Secondary | ICD-10-CM

## 2019-09-26 LAB — HEMOGLOBIN A1C
Hgb A1c MFr Bld: 6.5 % of total Hgb — ABNORMAL HIGH (ref <5.7)
Mean Plasma Glucose: 140 (calc)
eAG (mmol/L): 7.7 (calc)

## 2019-09-26 LAB — CBC WITH DIFFERENTIAL/PLATELET
Absolute Monocytes: 270 cells/uL (ref 200–950)
Basophils Absolute: 20 cells/uL (ref 0–200)
Basophils Relative: 0.4 %
Eosinophils Absolute: 50 cells/uL (ref 15–500)
Eosinophils Relative: 1 %
HCT: 36.3 % (ref 35.0–45.0)
Hemoglobin: 12 g/dL (ref 11.7–15.5)
Lymphs Abs: 2800 cells/uL (ref 850–3900)
MCH: 30.9 pg (ref 27.0–33.0)
MCHC: 33.1 g/dL (ref 32.0–36.0)
MCV: 93.6 fL (ref 80.0–100.0)
MPV: 9.5 fL (ref 7.5–12.5)
Monocytes Relative: 5.4 %
Neutro Abs: 1860 cells/uL (ref 1500–7800)
Neutrophils Relative %: 37.2 %
Platelets: 291 10*3/uL (ref 140–400)
RBC: 3.88 10*6/uL (ref 3.80–5.10)
RDW: 13.6 % (ref 11.0–15.0)
Total Lymphocyte: 56 %
WBC: 5 10*3/uL (ref 3.8–10.8)

## 2019-09-26 LAB — VITAMIN B12: Vitamin B-12: 489 pg/mL (ref 200–1100)

## 2019-09-26 LAB — COMPLETE METABOLIC PANEL WITH GFR
AG Ratio: 1.3 (calc) (ref 1.0–2.5)
ALT: 11 U/L (ref 6–29)
AST: 18 U/L (ref 10–35)
Albumin: 4.2 g/dL (ref 3.6–5.1)
Alkaline phosphatase (APISO): 64 U/L (ref 37–153)
BUN/Creatinine Ratio: 17 (calc) (ref 6–22)
BUN: 20 mg/dL (ref 7–25)
CO2: 25 mmol/L (ref 20–32)
Calcium: 9.8 mg/dL (ref 8.6–10.4)
Chloride: 106 mmol/L (ref 98–110)
Creat: 1.17 mg/dL — ABNORMAL HIGH (ref 0.60–0.88)
GFR, Est African American: 51 mL/min/{1.73_m2} — ABNORMAL LOW (ref >=60)
GFR, Est Non African American: 44 mL/min/{1.73_m2} — ABNORMAL LOW (ref >=60)
Globulin: 3.2 g/dL (calc) (ref 1.9–3.7)
Glucose, Bld: 121 mg/dL — ABNORMAL HIGH (ref 65–99)
Potassium: 4.3 mmol/L (ref 3.5–5.3)
Sodium: 139 mmol/L (ref 135–146)
Total Bilirubin: 0.4 mg/dL (ref 0.2–1.2)
Total Protein: 7.4 g/dL (ref 6.1–8.1)

## 2019-09-26 LAB — TSH: TSH: 1.33 mIU/L (ref 0.40–4.50)

## 2019-09-26 LAB — LIPID PANEL
Cholesterol: 136 mg/dL (ref <200)
HDL: 63 mg/dL (ref >=50)
LDL Cholesterol (Calc): 59 mg/dL (calc)
Non-HDL Cholesterol (Calc): 73 mg/dL (calc) (ref <130)
Total CHOL/HDL Ratio: 2.2 (calc) (ref <5.0)
Triglycerides: 62 mg/dL (ref <150)

## 2019-09-27 ENCOUNTER — Ambulatory Visit: Payer: Medicaid Other | Admitting: Orthotics

## 2019-09-27 ENCOUNTER — Other Ambulatory Visit: Payer: Medicaid Other

## 2019-09-27 DIAGNOSIS — R52 Pain, unspecified: Secondary | ICD-10-CM

## 2019-09-27 NOTE — Progress Notes (Signed)
Patient came in with daughter and picked out apex black petals shoe w/ one pair of inserts.  $250 self pay

## 2019-10-02 ENCOUNTER — Encounter: Payer: Self-pay | Admitting: Orthopedic Surgery

## 2019-10-02 ENCOUNTER — Ambulatory Visit (INDEPENDENT_AMBULATORY_CARE_PROVIDER_SITE_OTHER): Payer: Medicaid Other | Admitting: Physician Assistant

## 2019-10-02 VITALS — Ht 64.0 in | Wt 216.0 lb

## 2019-10-02 DIAGNOSIS — G8929 Other chronic pain: Secondary | ICD-10-CM | POA: Diagnosis not present

## 2019-10-02 DIAGNOSIS — M25561 Pain in right knee: Secondary | ICD-10-CM

## 2019-10-02 MED ORDER — LIDOCAINE HCL 1 % IJ SOLN
5.0000 mL | INTRAMUSCULAR | Status: AC | PRN
  Administered 2019-10-02: 5 mL

## 2019-10-02 MED ORDER — METHYLPREDNISOLONE ACETATE 40 MG/ML IJ SUSP
40.0000 mg | INTRAMUSCULAR | Status: AC | PRN
  Administered 2019-10-02: 40 mg via INTRA_ARTICULAR

## 2019-10-02 NOTE — Addendum Note (Signed)
Addended by: Pamella Pert on: 10/02/2019 01:56 PM   Modules accepted: Orders

## 2019-10-02 NOTE — Progress Notes (Signed)
Office Visit Note   Patient: Michelle Rowe           Date of Birth: 04-Feb-1941           MRN: [id number] Visit Date: 10/02/2019              Requested by: Sandrea Hughs, NP [address] [place],  [place] [address] PCP: Sandrea Hughs, NP  Chief Complaint  Patient presents with  . Right Knee - Pain      HPI: A pleasant 79 year old woman who recently moved here to be nearer to her daughter.  She has had a long history of right knee pain and swelling.  Her daughter states she does have a history of gout but is unsure when her last uric acid was checked.  She complains of pain in her knee  Assessment & Plan: Visit Diagnoses: No diagnosis found.  Plan: We will go forward with an injection into her right knee today.  Also have recommended checking her uric acid because I am not see that this has been done.  Follow-up in 3 weeks.  I discussed with her and her daughter that she does have significant arthritis in that right knee  Follow-Up Instructions: No follow-ups on file.   Ortho Exam  Patient is alert, oriented, no adenopathy, well-dressed, normal affect, normal respiratory effort. Right knee: No effusion.  There is some soft tissue swelling but no cellulitis valgus alignment.  Tender around the joint line.  Compartments are soft and nontender.  Imaging: No results found. No images are attached to the encounter.  Labs: Lab Results  Component Value Date   HGBA1C 6.5 (H) 09/26/2019   HGBA1C 6.7 11/03/2018   HGBA1C 6.6 03/24/2018     Lab Results  Component Value Date   ALBUMIN 3.7 07/14/2019   ALBUMIN 4.4 03/24/2018    No results found for: MG No results found for: VD25OH  No results found for: PREALBUMIN CBC EXTENDED Latest Ref Rng & Units 09/26/2019 07/14/2019 07/08/2019  WBC 3.8 - 10.8 Thousand/uL 5.0 9.6 10.4  RBC 3.80 - 5.10 Million/uL 3.88 4.4 4.15  HGB 11.7 - 15.5 g/dL 12.0 13.4 12.9  HCT 35 - 45 % 36.3 40 39.6  PLT 140 - 400 Thousand/uL 291 304 283    NEUTROABS 1,500 - 7,800 cells/uL 1,860 5 7.2  LYMPHSABS 850 - 3,900 cells/uL 2,800 - 2.2     Body mass index is 37.08 kg/m.  Orders:  No orders of the defined types were placed in this encounter.  No orders of the defined types were placed in this encounter.    Procedures: Large Joint Inj: R knee on 10/02/2019 1:41 PM Indications: pain and diagnostic evaluation Details: 22 G 1.5 in needle, anterolateral approach  Arthrogram: No  Medications: 40 mg methylPREDNISolone acetate 40 MG/ML; 5 mL lidocaine 1 % Outcome: tolerated well, no immediate complications Procedure, treatment alternatives, risks and benefits explained, specific risks discussed. Consent was given by the patient.      Clinical Data: No additional findings.  ROS:  All other systems negative, except as noted in the HPI. Review of Systems  Objective: Vital Signs: Ht 5\' 4"  (1.626 m)   Wt 216 lb (98 kg)   BMI 37.08 kg/m   Specialty Comments:  No specialty comments available.  PMFS History: There are no problems to display for this patient.  Past Medical History:  Diagnosis Date  . Anterolisthesis    Grade 1 of L3 on L4 on x-ray  07/18/19  . Aortic regurgitation    Seen on 2D echo 07/07/16  . CKD (chronic kidney disease), stage III   . DDD (degenerative disc disease), lumbar   . Diabetes mellitus without complication ([facility])   . Diastolic CHF (HCC)    Grade 2/4 with LVEF of 60-65%  . Diastolic dysfunction    Grade 2/4 with LVEF of 60-65% and LVH  . Gout   . HLD (hyperlipidemia)   . Hyperparathyroidism ([place])   . Hypertension   . Lumbar facet arthropathy   . Memory impairment   . Mitral valve regurgitation    Seen on 2D echo 07/07/16  . OA (osteoarthritis)   . OSA (obstructive sleep apnea)   . Polycythemia   . Shingles   . Thrombocytopenia ([place])   . Vitamin D deficiency     Family History  Problem Relation Age of Onset  . Diabetes Mother   . Hypertension Father   . Hypertension Brother    . Hypertension Sister   . Diabetes Sister     Past Surgical History:  Procedure Laterality Date  . LAPAROSCOPIC HYSTERECTOMY     Social History   Occupational History  . Not on file  Tobacco Use  . Smoking status: Never Smoker  . Smokeless tobacco: Never Used  Substance and Sexual Activity  . Alcohol use: Never  . Drug use: Never  . Sexual activity: Not Currently

## 2019-10-03 ENCOUNTER — Telehealth: Payer: Self-pay | Admitting: Family

## 2019-10-03 LAB — URIC ACID: Uric Acid, Serum: 3 mg/dL (ref 2.5–7.0)

## 2019-10-03 NOTE — Telephone Encounter (Signed)
Nikki with [facility] called to say that the visit note info is not complete for them to view & consider scheduling pt.  She also stated that notes don't indicate any memory or neurological issues.  Thanks, Lattie Haw

## 2019-10-03 NOTE — Telephone Encounter (Signed)
Noted  

## 2019-10-09 ENCOUNTER — Other Ambulatory Visit: Payer: Self-pay

## 2019-10-09 ENCOUNTER — Ambulatory Visit: Payer: Medicaid Other | Admitting: Diagnostic Neuroimaging

## 2019-10-09 ENCOUNTER — Encounter: Payer: Self-pay | Admitting: Diagnostic Neuroimaging

## 2019-10-09 VITALS — BP 144/80 | HR 84 | Ht 64.0 in | Wt 223.0 lb

## 2019-10-09 DIAGNOSIS — F03B18 Unspecified dementia, moderate, with other behavioral disturbance: Secondary | ICD-10-CM

## 2019-10-09 DIAGNOSIS — F0391 Unspecified dementia with behavioral disturbance: Secondary | ICD-10-CM | POA: Diagnosis not present

## 2019-10-09 NOTE — Patient Instructions (Signed)
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

## 2019-10-09 NOTE — Progress Notes (Signed)
[facility]  PATIENT: Michelle Rowe DOB: 13-Jul-1940  REFERRING CLINICIAN: Ngetich, [name] C, NP HISTORY FROM: patient and daughter  REASON FOR VISIT: new consult    HISTORICAL  CHIEF COMPLAINT:  Chief Complaint  Patient presents with  . Memory Loss    rm 7 New Pt dgtr- Olin Hauser "she is in denial"  MMSE 11    HISTORY OF PRESENT ILLNESS:   79 year old female here for evaluation of memory loss.  Patient has had gradual onset progressive short-term memory loss and confusion since 2019.  Patient was living in [place] previously and daughter was helping out on intermittent basis.  Patient has been getting lost, losing things, difficulty taking her medications, decreased insight, increased paranoia, hiding things.  She is not able to follow directions for her medical care.  Patient moved to [facility] after her 79st birthday in April 2021.  Patient has seen PCP, neurologist in her hometown.  Now she is established with [facility].  Apparently she had CT and MRI of the brain which were unremarkable.    REVIEW OF SYSTEMS: Full 14 system review of systems performed and negative with exception of: as per HPI.  ALLERGIES: Allergies  Allergen Reactions  . Augmentin [Amoxicillin-Pot Clavulanate] Rash    Critically high - Onset 07/13/2019  . Amoxicillin     Did it involve swelling of the face/tongue/throat, SOB, or low BP? Y Did it involve sudden or severe rash/hives, skin peeling, or any reaction on the inside of your mouth or nose? Y (Rash, Swelling, SOB) Did you need to seek medical attention at a hospital or doctor's office? Y When did it last happen?Yesterday If all above answers are "NO", may proceed with cephalosporin use.   Marland Kitchen Shellfish Allergy     HOME MEDICATIONS: Outpatient Medications Prior to Visit  Medication Sig Dispense Refill  . allopurinol (ZYLOPRIM) 300 MG tablet Take 300 mg by mouth daily.    Marland Kitchen amLODipine (NORVASC)  10 MG tablet Take 10 mg by mouth daily.    Marland Kitchen aspirin 81 MG chewable tablet Chew 81 mg by mouth daily.    . fluticasone (FLONASE) 50 MCG/ACT nasal spray Place 1 spray into both nostrils 2 (two) times daily as needed for allergies.     Marland Kitchen glipiZIDE (GLUCOTROL) 5 MG tablet Take 5 mg by mouth daily.    Marland Kitchen glucose blood test strip 1 each by Other route as needed for other. Use as instructed 100 each 3  . hydrALAZINE (APRESOLINE) 25 MG tablet Take 25 mg by mouth 3 (three) times daily.    . Lancets Ultra Thin 30G MISC Inject 1 each into the skin daily. 30 each 3  . lisinopril (ZESTRIL) 20 MG tablet Take 20 mg by mouth 2 (two) times daily.    Marland Kitchen loratadine (CLARITIN) 10 MG tablet Take 1 tablet (10 mg total) by mouth daily. 30 tablet 11  . metFORMIN (GLUCOPHAGE) 500 MG tablet Take 500 mg by mouth daily.    . Multiple Vitamin (MULTIVITAMIN) capsule Take 1 capsule by mouth daily.    . simvastatin (ZOCOR) 20 MG tablet Take 20 mg by mouth daily at 6 PM.      No facility-administered medications prior to visit.    PAST MEDICAL HISTORY: Past Medical History:  Diagnosis Date  . Anterolisthesis    Grade 1 of L3 on L4 on x-ray 07/18/19  . Aortic regurgitation    Seen on 2D echo 07/07/16  . Cataract   . CKD (chronic  kidney disease), stage III   . DDD (degenerative disc disease), lumbar   . Diabetes mellitus without complication ([place])   . Diastolic CHF (HCC)    Grade 2/4 with LVEF of 60-65%  . Diastolic dysfunction    Grade 2/4 with LVEF of 60-65% and LVH  . Gout   . HLD (hyperlipidemia)   . Hyperparathyroidism ([place])   . Hypertension   . Lumbar facet arthropathy   . Memory impairment   . Mitral valve regurgitation    Seen on 2D echo 07/07/16  . OA (osteoarthritis)   . OSA (obstructive sleep apnea)   . Osteoarthritis   . Polycythemia   . Shingles   . Thrombocytopenia ([place])   . Vitamin D deficiency     PAST SURGICAL HISTORY: Past Surgical History:  Procedure Laterality Date  . LAPAROSCOPIC  HYSTERECTOMY      FAMILY HISTORY: Family History  Problem Relation Age of Onset  . Diabetes Mother   . Hypertension Father   . Hypertension Brother   . Hypertension Sister   . Diabetes Sister     SOCIAL HISTORY: Social History   Socioeconomic History  . Marital status: Single    Spouse name: Not on file  . Number of children: 4  . Years of education: Not on file  . Highest education level: 9th grade  Occupational History    Comment: NA  Tobacco Use  . Smoking status: Never Smoker  . Smokeless tobacco: Never Used  Substance and Sexual Activity  . Alcohol use: Never  . Drug use: Never  . Sexual activity: Not Currently  Other Topics Concern  . Not on file  Social History Narrative   Does not exercise.   No domestic violence.    10/09/19 Children - Lives with daughter,Pamela.   Highest level of education - 9th grade   Has HCPOA      Has difficulty managing medications and finances. Has difficulty affording medications.   No difficulty with dressing, bathing, or preparing food.    Social Determinants of Health   Financial Resource Strain:   . Difficulty of Paying Living Expenses: Not on file  Food Insecurity:   . Worried About Charity fundraiser in the Last Year: Not on file  . Ran Out of Food in the Last Year: Not on file  Transportation Needs:   . Lack of Transportation (Medical): Not on file  . Lack of Transportation (Non-Medical): Not on file  Physical Activity:   . Days of Exercise per Week: Not on file  . Minutes of Exercise per Session: Not on file  Stress:   . Feeling of Stress : Not on file  Social Connections:   . Frequency of Communication with Friends and Family: Not on file  . Frequency of Social Gatherings with Friends and Family: Not on file  . Attends Religious Services: Not on file  . Active Member of Clubs or Organizations: Not on file  . Attends Archivist Meetings: Not on file  . Marital Status: Not on file  Intimate Partner  Violence:   . Fear of Current or Ex-Partner: Not on file  . Emotionally Abused: Not on file  . Physically Abused: Not on file  . Sexually Abused: Not on file     PHYSICAL EXAM  GENERAL EXAM/CONSTITUTIONAL: Vitals:  Vitals:   10/09/19 0919  BP: (!) 144/80  Pulse: 84  Weight: 223 lb (101.2 kg)  Height: 5\' 4"  (1.626 m)     Body mass  index is 38.28 kg/m. Wt Readings from Last 3 Encounters:  10/09/19 223 lb (101.2 kg)  10/02/19 216 lb (98 kg)  09/21/19 216 lb (98 kg)     Patient is in no distress; well developed, nourished and groomed; neck is supple  CARDIOVASCULAR:  Examination of carotid arteries is normal; no carotid bruits  Regular rate and rhythm, no murmurs  Examination of peripheral vascular system by observation and palpation is normal  EYES:  Ophthalmoscopic exam of optic discs and posterior segments is normal; no papilledema or hemorrhages  No exam data present  MUSCULOSKELETAL:  Gait, strength, tone, movements noted in Neurologic exam below  NEUROLOGIC: MENTAL STATUS:  MMSE - [employer] Exam 10/09/2019  Orientation to time 0  Orientation to Place 1  Registration 3  Attention/ Calculation 0  Recall 0  Language- name 2 objects 2  Language- repeat 0  Language- follow 3 step command 3  Language- read & follow direction 1  Write a sentence 1  Copy design 0  Total score 11    awake, alert, oriented to person  DECR memory   DECR attention and concentration  FLUENCY DECREASED, comprehension intact, naming intact  DECR INSIGHT  CRANIAL NERVE:   2nd - no papilledema on fundoscopic exam  2nd, 3rd, 4th, 6th - pupils equal and reactive to light, visual fields full to confrontation, extraocular muscles intact, no nystagmus  5th - facial sensation symmetric  7th - facial strength symmetric  8th - hearing intact  9th - palate elevates symmetrically, uvula midline  11th - shoulder shrug symmetric  12th - tongue protrusion  midline  MOTOR:   normal bulk and tone, full strength in the BUE, BLE  SENSORY:   normal and symmetric to light touch  COORDINATION:   finger-nose-finger, fine finger movements normal  REFLEXES:   deep tendon reflexes TRACE and symmetric  GAIT/STATION:   narrow based gait     DIAGNOSTIC DATA (LABS, IMAGING, TESTING) - I reviewed patient records, labs, notes, testing and imaging myself where available.  Lab Results  Component Value Date   WBC 5.0 09/26/2019   HGB 12.0 09/26/2019   HCT 36.3 09/26/2019   MCV 93.6 09/26/2019   PLT 291 09/26/2019      Component Value Date/Time   NA 139 09/26/2019 0924   NA 141 07/14/2019 0000   K 4.3 09/26/2019 0924   CL 106 09/26/2019 0924   CO2 25 09/26/2019 0924   GLUCOSE 121 (H) 09/26/2019 0924   BUN 20 09/26/2019 0924   BUN 35 (A) 07/14/2019 0000   CREATININE 1.17 (H) 09/26/2019 0924   CALCIUM 9.8 09/26/2019 0924   PROT 7.4 09/26/2019 0924   ALBUMIN 3.7 07/14/2019 0000   AST 18 09/26/2019 0924   ALT 11 09/26/2019 0924   ALKPHOS 78 07/14/2019 0000   BILITOT 0.4 09/26/2019 0924   GFRNONAA 44 (L) 09/26/2019 0924   GFRAA 51 (L) 09/26/2019 0924   Lab Results  Component Value Date   CHOL 136 09/26/2019   HDL 63 09/26/2019   LDLCALC 59 09/26/2019   TRIG 62 09/26/2019   CHOLHDL 2.2 09/26/2019   Lab Results  Component Value Date   HGBA1C 6.5 (H) 09/26/2019   Lab Results  Component Value Date   VITAMINB12 489 09/26/2019   Lab Results  Component Value Date   TSH 1.33 09/26/2019      ASSESSMENT AND PLAN  79 y.o. year old female here with right also progressive short-term memory loss, confusion, decline  in ADLs, consistent with neurodegenerative dementia.  Dx:  1. Moderate dementia with behavioral disturbance (HCC)      PLAN:  MEMORY LOSS (moderate dementia with behavioral changes) - consider memantine 10mg  at bedtime; increase to twice a day after 1-2 weeks - safety / supervision issues reviewed -  caregiver resources provided - no driving; caution with finances  Return for pending if symptoms worsen or fail to improve, return to PCP.    Penni Bombard, MD 0/38/8828, 0:03 AM Certified in Neurology, Neurophysiology and Neuroimaging  [facility] [address] [place], [place] [address] [phone]

## 2019-10-25 ENCOUNTER — Encounter: Payer: Self-pay | Admitting: Physician Assistant

## 2019-10-25 ENCOUNTER — Other Ambulatory Visit: Payer: Self-pay

## 2019-10-25 ENCOUNTER — Ambulatory Visit (INDEPENDENT_AMBULATORY_CARE_PROVIDER_SITE_OTHER): Payer: Medicaid Other | Admitting: Physician Assistant

## 2019-10-25 DIAGNOSIS — M25561 Pain in right knee: Secondary | ICD-10-CM

## 2019-10-25 NOTE — Progress Notes (Signed)
Office Visit Note   Patient: Michelle Rowe           Date of Birth: [birth year]           MRN: [id number] Visit Date: 10/25/2019              Requested by: Sandrea Hughs, NP [address] [place],  [place] [address] PCP: Sandrea Hughs, NP  Chief Complaint  Patient presents with  . Right Knee - Follow-up      HPI: Patient is a pleasant 79 year old woman who follows up with her daughter for her right knee pain. She does have a history of gout. Her uric acid with 3. At her last visit she was given a steroid injection. She states this is helped her quite a bit  Assessment & Plan: Visit Diagnoses: No diagnosis found.  Plan: Follow-up for another injection as needed. If she gets some mild symptoms she could use a topical Voltaren gel and I have given this information to her daughter  Follow-Up Instructions: No follow-ups on file.   Ortho Exam  Patient is alert, oriented, no adenopathy, well-dressed, normal affect, normal respiratory effort. Right knee no effusion no swelling minimal tenderness on the joint line she does have some grinding. No reproduced pain with extension and flexion  Imaging: No results found. No images are attached to the encounter.  Labs: Lab Results  Component Value Date   HGBA1C 6.5 (H) 09/26/2019   HGBA1C 6.7 11/03/2018   HGBA1C 6.6 03/24/2018   LABURIC 3.0 10/02/2019     Lab Results  Component Value Date   ALBUMIN 3.7 07/14/2019   ALBUMIN 4.4 03/24/2018   LABURIC 3.0 10/02/2019    No results found for: MG No results found for: VD25OH  No results found for: PREALBUMIN CBC EXTENDED Latest Ref Rng & Units 09/26/2019 07/14/2019 07/08/2019  WBC 3.8 - 10.8 Thousand/uL 5.0 9.6 10.4  RBC 3.80 - 5.10 Million/uL 3.88 4.4 4.15  HGB 11.7 - 15.5 g/dL 12.0 13.4 12.9  HCT 35 - 45 % 36.3 40 39.6  PLT 140 - 400 Thousand/uL 291 304 283  NEUTROABS 1,500 - 7,800 cells/uL 1,860 5 7.2  LYMPHSABS 850 - 3,900 cells/uL 2,800 - 2.2     There is no height or  weight on file to calculate BMI.  Orders:  No orders of the defined types were placed in this encounter.  No orders of the defined types were placed in this encounter.    Procedures: No procedures performed  Clinical Data: No additional findings.  ROS:  All other systems negative, except as noted in the HPI. Review of Systems  Objective: Vital Signs: There were no vitals taken for this visit.  Specialty Comments:  No specialty comments available.  PMFS History: There are no problems to display for this patient.  Past Medical History:  Diagnosis Date  . Anterolisthesis    Grade 1 of L3 on L4 on x-ray 07/18/19  . Aortic regurgitation    Seen on 2D echo 07/07/16  . Cataract   . CKD (chronic kidney disease), stage III   . DDD (degenerative disc disease), lumbar   . Diabetes mellitus without complication ([place])   . Diastolic CHF (HCC)    Grade 2/4 with LVEF of 60-65%  . Diastolic dysfunction    Grade 2/4 with LVEF of 60-65% and LVH  . Gout   . HLD (hyperlipidemia)   . Hyperparathyroidism ([place])   . Hypertension   . Lumbar facet arthropathy   .  Memory impairment   . Mitral valve regurgitation    Seen on 2D echo 07/07/16  . OA (osteoarthritis)   . OSA (obstructive sleep apnea)   . Osteoarthritis   . Polycythemia   . Shingles   . Sleep apnea   . Thrombocytopenia ([place])   . Vitamin D deficiency     Family History  Problem Relation Age of Onset  . Diabetes Mother   . Hypertension Father   . Hypertension Brother   . Hypertension Sister   . Diabetes Sister     Past Surgical History:  Procedure Laterality Date  . LAPAROSCOPIC HYSTERECTOMY     Social History   Occupational History    Comment: NA  Tobacco Use  . Smoking status: Never Smoker  . Smokeless tobacco: Never Used  Substance and Sexual Activity  . Alcohol use: Never  . Drug use: Never  . Sexual activity: Not Currently

## 2019-10-30 ENCOUNTER — Ambulatory Visit: Payer: Medicaid Other | Admitting: Orthotics

## 2019-11-08 ENCOUNTER — Other Ambulatory Visit: Payer: Self-pay

## 2019-11-08 ENCOUNTER — Ambulatory Visit: Payer: Medicaid Other | Admitting: Orthotics

## 2019-11-08 ENCOUNTER — Ambulatory Visit: Payer: Medicaid Other | Admitting: Cardiology

## 2019-11-08 ENCOUNTER — Ambulatory Visit (INDEPENDENT_AMBULATORY_CARE_PROVIDER_SITE_OTHER): Payer: Medicaid Other | Admitting: *Deleted

## 2019-11-08 ENCOUNTER — Encounter: Payer: Self-pay | Admitting: Cardiology

## 2019-11-08 VITALS — BP 160/97 | HR 81 | Resp 16 | Ht 64.0 in | Wt 220.0 lb

## 2019-11-08 DIAGNOSIS — R058 Other specified cough: Secondary | ICD-10-CM

## 2019-11-08 DIAGNOSIS — Z23 Encounter for immunization: Secondary | ICD-10-CM

## 2019-11-08 DIAGNOSIS — T464X5A Adverse effect of angiotensin-converting-enzyme inhibitors, initial encounter: Secondary | ICD-10-CM | POA: Insufficient documentation

## 2019-11-08 DIAGNOSIS — R52 Pain, unspecified: Secondary | ICD-10-CM

## 2019-11-08 DIAGNOSIS — I5189 Other ill-defined heart diseases: Secondary | ICD-10-CM

## 2019-11-08 DIAGNOSIS — I10 Essential (primary) hypertension: Secondary | ICD-10-CM | POA: Insufficient documentation

## 2019-11-08 MED ORDER — LOSARTAN POTASSIUM 50 MG PO TABS: 50.0000 mg | ORAL_TABLET | Freq: Every day | ORAL | 1 refills | Status: DC

## 2019-11-08 NOTE — Progress Notes (Signed)
Patient referred by Sandrea Hughs, NP for diastolic dysfunction  Subjective:   Michelle Rowe, female    DOB: 1940/12/15, 79 y.o.   MRN: [id number]   Chief Complaint  Patient presents with  . Congestive Heart Failure  . New Patient (Initial Visit)     HPI  79 y.o. African-American female with hypertension, type 2 diabetes mellitus, moderate dementia with behavioral changes, referred for evaluation of diastolic dysfunction seen on echocardiogram.  Patient is originally from [facility], moved in with her daughter here in [place] given her advanced dementia changes.  She is able to perform her ADLs by herself, but is having significant difficulties with memory and behavioral changes.  She denies any overt exertional dyspnea, chest pain.  She has occasional bilateral leg edema.  She is currently on amlodipine 10 mg daily.  Daughter has noticed that she has had persistent dry cough.  Patient currently takes lisinopril 20 mg twice daily.  She underwent an echocardiogram injection in [place], that showed grade 2 diastolic dysfunction.   Past Medical History:  Diagnosis Date  . Anterolisthesis    Grade 1 of L3 on L4 on x-ray 07/18/19  . Aortic regurgitation    Seen on 2D echo 07/07/16  . Cataract   . CKD (chronic kidney disease), stage III   . DDD (degenerative disc disease), lumbar   . Diabetes mellitus without complication ([place])   . Diastolic CHF (HCC)    Grade 2/4 with LVEF of 60-65%  . Diastolic dysfunction    Grade 2/4 with LVEF of 60-65% and LVH  . Gout   . HLD (hyperlipidemia)   . Hyperparathyroidism ([place])   . Hypertension   . Lumbar facet arthropathy   . Memory impairment   . Mitral valve regurgitation    Seen on 2D echo 07/07/16  . OA (osteoarthritis)   . OSA (obstructive sleep apnea)   . Osteoarthritis   . Polycythemia   . Shingles   . Sleep apnea   . Thrombocytopenia ([place])   . Vitamin D deficiency      Past Surgical History:    Procedure Laterality Date  . LAPAROSCOPIC HYSTERECTOMY       Social History   Tobacco Use  Smoking Status Never Smoker  Smokeless Tobacco Never Used    Social History   Substance and Sexual Activity  Alcohol Use Never     Family History  Problem Relation Age of Onset  . Diabetes Mother   . Hypertension Father   . Hypertension Brother   . Hypertension Sister   . Diabetes Sister      Current Outpatient Medications on File Prior to Visit  Medication Sig Dispense Refill  . allopurinol (ZYLOPRIM) 300 MG tablet Take 300 mg by mouth daily.    Marland Kitchen amLODipine (NORVASC) 10 MG tablet Take 10 mg by mouth daily.    Marland Kitchen aspirin 81 MG chewable tablet Chew 81 mg by mouth daily.    . fluticasone (FLONASE) 50 MCG/ACT nasal spray Place 1 spray into both nostrils 2 (two) times daily as needed for allergies.     Marland Kitchen glipiZIDE (GLUCOTROL) 5 MG tablet Take 5 mg by mouth daily.    Marland Kitchen glucose blood test strip 1 each by Other route as needed for other. Use as instructed 100 each 3  . hydrALAZINE (APRESOLINE) 25 MG tablet Take 25 mg by mouth 3 (three) times daily.    . Lancets Ultra Thin 30G MISC Inject 1 each into the skin  daily. 30 each 3  . metFORMIN (GLUCOPHAGE) 500 MG tablet Take 500 mg by mouth daily.    . simvastatin (ZOCOR) 20 MG tablet Take 20 mg by mouth daily at 6 PM.     . loratadine (CLARITIN) 10 MG tablet Take 1 tablet (10 mg total) by mouth daily. 30 tablet 11   No current facility-administered medications on file prior to visit.    Cardiovascular and other pertinent studies:   EKG 10/08/2019: Sinus rhythm 65 bpm.  Possible old anteroseptal infarct.  Nonspecific T wave changes.  External echocardiogram 07/18/2019: Mild LVH.  EF 60%.  2 diastolic dysfunction. Mild LA dilatation Mild mitral and tricuspid valve thickening. No significant stenosis or regurgitation     Recent labs: 09/26/2019: Glucose 121, BUN/Cr 20/1.17. EGFR 51. Na/K 139/4.3. Rest of the CMP normal H/H 12/36.  MCV 93. Platelets 291 HbA1C 6.5% Chol 136, TG 62, HDL 63, LDL 59 TSH 1.3 normal    Review of Systems  Cardiovascular: Positive for leg swelling. Negative for chest pain, dyspnea on exertion, palpitations and syncope.  Psychiatric/Behavioral: Positive for memory loss.         Vitals:   11/08/19 1042  BP: (!) 160/97  Pulse: 81  Resp: 16  SpO2: 98%     Body mass index is 37.76 kg/m. Filed Weights   11/08/19 1042  Weight: 220 lb (99.8 kg)     Objective:   Physical Exam Vitals and nursing note reviewed.  Constitutional:      General: She is not in acute distress. Neck:     Vascular: No JVD.  Cardiovascular:     Rate and Rhythm: Normal rate and regular rhythm.     Heart sounds: Normal heart sounds. No murmur heard.   Pulmonary:     Effort: Pulmonary effort is normal.     Breath sounds: Normal breath sounds. No wheezing or rales.  Musculoskeletal:     Right lower leg: No edema.     Left lower leg: No edema.         Assessment & Recommendations:   79 y.o. African-American female with hypertension, type 2 diabetes mellitus, moderate dementia with behavioral changes, referred for evaluation of diastolic dysfunction seen on echocardiogram.  Diastolic dysfunction: Incidental finding on echocardiogram.  Patient does not have any clinical heart failure at this time.  Occasional leg edema likely related to amlodipine.  Given her advanced moderate dementia, I do not think any further cardiac testing will have any meaningful impact on her overall clinical care.  Continue management of hypertension.  Hypertension: Given her cough, recommend switching lisinopril to losartan 50 mg daily. Counseled patient and daughter regarding low-salt diet.   Continue follow-up with primary care and urology.  I will see her on as-needed basis.  Thank you for referring the patient to [place]. Please feel free to contact with any questions.   Nigel Mormon, MD Pager:  [phone] Office: [phone]

## 2019-11-08 NOTE — Progress Notes (Signed)
Picked up self pay shoes.

## 2019-11-09 ENCOUNTER — Ambulatory Visit: Payer: Medicaid Other | Admitting: Podiatry

## 2019-11-13 ENCOUNTER — Ambulatory Visit: Payer: Medicaid Other | Admitting: Podiatry

## 2019-12-05 NOTE — Progress Notes (Signed)
Name: Michelle Rowe  MRN/ DOB: [id number], 01-23-1941    Age/ Sex: 79 y.o., female    PCP: Ngetich, [name], NP   Reason for Endocrinology Evaluation: Hyperparathyroidism     Date of Initial Endocrinology Evaluation: 12/06/2019     HPI: Ms. Michelle Rowe is a 79 y.o. female with a past medical history of T2DM and HTN. The patient presented for initial endocrinology clinic visit on 12/06/2019 for consultative assistance with her Hypercalcemia    She is accompanied by her daughter Michelle Hauser  Ms. Griner indicates that she was first diagnosed with hypercalcemia in 2021. Since that time, she denies experienced symptoms of constipation, polyuria, polydipsia,, diffuse muscle pains, but has significant memory impairment. She does not use of over the counter calcium (including supplements, Tums, Rolaids, or other calcium containing antacids), lithium, HCTZ, or vitamin D supplements.   She denies  history of kidney stones, kidney disease, liver disease, granulomatous disease. She denies osteoporosis or prior fractures. Daily dietary calcium intake: 2 servings. She denies  family history of osteoporosis, parathyroid disease, thyroid disease.     HISTORY:  Past Medical History:  Past Medical History:  Diagnosis Date  . Anterolisthesis    Grade 1 of L3 on L4 on x-ray 07/18/19  . Aortic regurgitation    Seen on 2D echo 07/07/16  . Cataract   . CKD (chronic kidney disease), stage III   . DDD (degenerative disc disease), lumbar   . Diabetes mellitus without complication ([place])   . Diastolic CHF (HCC)    Grade 2/4 with LVEF of 60-65%  . Diastolic dysfunction    Grade 2/4 with LVEF of 60-65% and LVH  . Gout   . HLD (hyperlipidemia)   . Hyperparathyroidism ([place])   . Hypertension   . Lumbar facet arthropathy   . Memory impairment   . Mitral valve regurgitation    Seen on 2D echo 07/07/16  . OA (osteoarthritis)   . OSA (obstructive sleep apnea)   . Osteoarthritis   . Polycythemia   . Shingles   .  Sleep apnea   . Thrombocytopenia ([place])   . Vitamin D deficiency    Past Surgical History:  Past Surgical History:  Procedure Laterality Date  . LAPAROSCOPIC HYSTERECTOMY        Social History:  reports that she has never smoked. She has never used smokeless tobacco. She reports that she does not drink alcohol and does not use drugs.  Family History: family history includes Diabetes in her mother and sister; Hypertension in her brother, father, and sister.   HOME MEDICATIONS: Allergies as of 12/06/2019      Reactions   Augmentin [amoxicillin-pot Clavulanate] Rash   Critically high - Onset 07/13/2019   Amoxicillin    Did it involve swelling of the face/tongue/throat, SOB, or low BP? Y Did it involve sudden or severe rash/hives, skin peeling, or any reaction on the inside of your mouth or nose? Y (Rash, Swelling, SOB) Did you need to seek medical attention at a hospital or doctor's office? Y When did it last happen?Yesterday If all above answers are "NO", may proceed with cephalosporin use.   Shellfish Allergy       Medication List       Accurate as of December 06, 2019  7:09 AM. If you have any questions, ask your nurse or doctor.        allopurinol 300 MG tablet Commonly known as: ZYLOPRIM Take 300 mg by mouth daily.   amLODipine  10 MG tablet Commonly known as: NORVASC Take 10 mg by mouth daily.   aspirin 81 MG chewable tablet Chew 81 mg by mouth daily.   fluticasone 50 MCG/ACT nasal spray Commonly known as: FLONASE Place 1 spray into both nostrils 2 (two) times daily as needed for allergies.   glipiZIDE 5 MG tablet Commonly known as: GLUCOTROL Take 5 mg by mouth daily.   glucose blood test strip 1 each by Other route as needed for other. Use as instructed   hydrALAZINE 25 MG tablet Commonly known as: APRESOLINE Take 25 mg by mouth 3 (three) times daily.   Lancets Ultra Thin 30G Misc Inject 1 each into the skin daily.   loratadine 10 MG  tablet Commonly known as: CLARITIN Take 1 tablet (10 mg total) by mouth daily.   losartan 50 MG tablet Commonly known as: COZAAR Take 1 tablet (50 mg total) by mouth daily.   metFORMIN 500 MG tablet Commonly known as: GLUCOPHAGE Take 500 mg by mouth daily.   simvastatin 20 MG tablet Commonly known as: ZOCOR Take 20 mg by mouth daily at 6 PM.         REVIEW OF SYSTEMS: A comprehensive ROS was conducted with the patient and is negative except as per HPI      OBJECTIVE:  VS: BP 140/72   Pulse 80   Temp 97.8 F (36.6 C) (Temporal)   Ht 5\' 4"  (1.626 m)   Wt 220 lb 2 oz (99.8 kg)   SpO2 97%   BMI 37.78 kg/m    Wt Readings from Last 3 Encounters:  11/08/19 220 lb (99.8 kg)  10/09/19 223 lb (101.2 kg)  10/02/19 216 lb (98 kg)     EXAM: General: Pt appears well and is in NAD  Neck: General: Supple without adenopathy. Thyroid: Thyroid size normal.  No goiter or nodules appreciated. No thyroid bruit.  Lungs: Clear with good BS bilat with no rales, rhonchi, or wheezes  Heart: Auscultation: RRR.  Abdomen: Normoactive bowel sounds, soft, nontender, without masses or organomegaly palpable  Extremities:  BL LE: No pretibial edema normal ROM and strength.  Skin: Hair: Texture and amount normal with gender appropriate distribution Skin Inspection: No rashes Skin Palpation: Skin temperature, texture, and thickness normal to palpation  Neuro: Cranial nerves: II - XII grossly intact  Motor: Normal strength throughout DTRs: 2+ and symmetric in UE without delay in relaxation phase  Mental Status: Judgment, insight: Intact Orientation: Oriented to time, place, and person Mood and affect: No depression, anxiety, or agitation     DATA REVIEWED:   Results for ATZIRY, BARANSKI (MRN [id number]) as of 12/10/2019 07:07  Ref. Range 12/06/2019 07:58  Sodium Latest Ref Range: 135 - 145 mEq/L 138  Potassium Latest Ref Range: 3.5 - 5.1 mEq/L 4.3  Chloride Latest Ref Range: 96 - 112 mEq/L  104  CO2 Latest Ref Range: 19 - 32 mEq/L 24  Glucose Latest Ref Range: 70 - 99 mg/dL 127 (H)  BUN Latest Ref Range: 6 - 23 mg/dL 25 (H)  Creatinine Latest Ref Range: 0.40 - 1.20 mg/dL 1.38 (H)  Calcium Latest Ref Range: 8.4 - 10.5 mg/dL 9.7  Phosphorus Latest Ref Range: 2.3 - 4.6 mg/dL 3.1  Albumin Latest Ref Range: 3.5 - 5.2 g/dL 4.3  GFR Latest Ref Range: >60.00 mL/min 35.89 (L)  VITD Latest Ref Range: 30.00 - 100.00 ng/mL 35.46    ASSESSMENT/PLAN/RECOMMENDATIONS:   1. Hyperparathyroidism :  - Resolved  - Pt with normal serum  calcium, vitamin D , phosphorus and PTH levels - No further endocrinological workup is needed at this time   Recommendations  She was encouraged to stay hydrated Consume 2-3 servings of low fat daily daily    F/U PRN    Addendum: Discussed normal PTH and calcium with daughter 12/13/2019 .    Signed electronically by: Mack Guise, MD  [facility] Endocrin[facility]  [facility] [address] [place], [place] [address] Phone: [phone] FAX: [phone]   CC: Sandrea Hughs, NP [place] [address] Phone: [phone] Fax: [phone]   Return to Endocrinology clinic as below: Future Appointments  Date Time Provider [place]  12/06/2019  7:50 AM Shauntay Brunelli, Melanie Crazier, MD [facility] None  12/25/2019  2:15 PM Ngetich, [name], NP [facility] None

## 2019-12-06 ENCOUNTER — Other Ambulatory Visit: Payer: Self-pay

## 2019-12-06 ENCOUNTER — Ambulatory Visit (INDEPENDENT_AMBULATORY_CARE_PROVIDER_SITE_OTHER): Payer: Medicaid Other | Admitting: Internal Medicine

## 2019-12-06 ENCOUNTER — Encounter: Payer: Self-pay | Admitting: Internal Medicine

## 2019-12-06 VITALS — BP 140/72 | HR 80 | Temp 97.8°F | Ht 64.0 in | Wt 220.1 lb

## 2019-12-06 DIAGNOSIS — E213 Hyperparathyroidism, unspecified: Secondary | ICD-10-CM

## 2019-12-06 LAB — BASIC METABOLIC PANEL
BUN: 25 mg/dL — ABNORMAL HIGH (ref 6–23)
CO2: 24 mEq/L (ref 19–32)
Calcium: 9.7 mg/dL (ref 8.4–10.5)
Chloride: 104 mEq/L (ref 96–112)
Creatinine, Ser: 1.38 mg/dL — ABNORMAL HIGH (ref 0.40–1.20)
GFR: 35.89 mL/min — ABNORMAL LOW (ref >60.00)
Glucose, Bld: 127 mg/dL — ABNORMAL HIGH (ref 70–99)
Potassium: 4.3 mEq/L (ref 3.5–5.1)
Sodium: 138 mEq/L (ref 135–145)

## 2019-12-06 LAB — PHOSPHORUS: Phosphorus: 3.1 mg/dL (ref 2.3–4.6)

## 2019-12-06 LAB — ALBUMIN: Albumin: 4.3 g/dL (ref 3.5–5.2)

## 2019-12-06 LAB — VITAMIN D 25 HYDROXY (VIT D DEFICIENCY, FRACTURES): VITD: 35.46 ng/mL (ref 30.00–100.00)

## 2019-12-06 NOTE — Patient Instructions (Addendum)
-   Stay hydrated  - Consume 2-3 servings of low fat dairy daily

## 2019-12-10 LAB — VITAMIN D 1,25 DIHYDROXY
Vitamin D 1, 25 (OH)2 Total: 40 pg/mL (ref 18–72)
Vitamin D2 1, 25 (OH)2: <8 pg/mL
Vitamin D3 1, 25 (OH)2: 40 pg/mL

## 2019-12-10 LAB — PARATHYROID HORMONE, INTACT (NO CA): PTH: 59 pg/mL (ref 14–64)

## 2019-12-21 ENCOUNTER — Other Ambulatory Visit: Payer: Self-pay

## 2019-12-21 DIAGNOSIS — I10 Essential (primary) hypertension: Secondary | ICD-10-CM

## 2019-12-21 MED ORDER — LORATADINE 10 MG PO TABS: 10.0000 mg | ORAL_TABLET | Freq: Every day | ORAL | 2 refills | Status: DC

## 2019-12-21 MED ORDER — LANCETS ULTRA THIN 30G MISC: 1.0000 | Freq: Every day | 3 refills | Status: DC

## 2019-12-21 MED ORDER — GLUCOSE BLOOD VI STRP: 1.0000 | ORAL_STRIP | 3 refills | Status: DC | PRN

## 2019-12-21 MED ORDER — GLIPIZIDE 5 MG PO TABS: 5.0000 mg | ORAL_TABLET | Freq: Every day | ORAL | 2 refills | Status: DC

## 2019-12-21 MED ORDER — FLUTICASONE PROPIONATE 50 MCG/ACT NA SUSP: 1.0000 | Freq: Two times a day (BID) | NASAL | 0 refills | Status: DC | PRN

## 2019-12-21 MED ORDER — SIMVASTATIN 20 MG PO TABS: 20.0000 mg | ORAL_TABLET | Freq: Every day | ORAL | 2 refills | Status: DC

## 2019-12-21 MED ORDER — HYDRALAZINE HCL 25 MG PO TABS: 25.0000 mg | ORAL_TABLET | Freq: Three times a day (TID) | ORAL | 2 refills | Status: DC

## 2019-12-21 MED ORDER — ALLOPURINOL 300 MG PO TABS: 300.0000 mg | ORAL_TABLET | Freq: Every day | ORAL | 2 refills | Status: DC

## 2019-12-21 MED ORDER — LOSARTAN POTASSIUM 50 MG PO TABS: 50.0000 mg | ORAL_TABLET | Freq: Every day | ORAL | 2 refills | Status: DC

## 2019-12-21 MED ORDER — METFORMIN HCL 500 MG PO TABS: 500.0000 mg | ORAL_TABLET | Freq: Every day | ORAL | 2 refills | Status: DC

## 2019-12-21 MED ORDER — AMLODIPINE BESYLATE 10 MG PO TABS: 10.0000 mg | ORAL_TABLET | Freq: Every day | ORAL | 2 refills | Status: DC

## 2019-12-21 NOTE — Telephone Encounter (Signed)
Patient daughter Olin Hauser states that mother and her just moved and made Ngetich, [place], NP as PCP. She states that she needs all medications refilled. Medications pend and sent to Sherrie Mustache, NP due to Hope, [name], NP being out of office.

## 2019-12-25 ENCOUNTER — Ambulatory Visit: Payer: Medicaid Other | Admitting: Family

## 2019-12-26 ENCOUNTER — Ambulatory Visit (INDEPENDENT_AMBULATORY_CARE_PROVIDER_SITE_OTHER): Payer: Medicaid Other | Admitting: Family

## 2019-12-26 ENCOUNTER — Encounter: Payer: Self-pay | Admitting: Family

## 2019-12-26 ENCOUNTER — Other Ambulatory Visit: Payer: Self-pay

## 2019-12-26 VITALS — BP 130/80 | HR 83 | Temp 97.3°F | Resp 18 | Ht 64.0 in | Wt 217.8 lb

## 2019-12-26 DIAGNOSIS — G301 Alzheimer's disease with late onset: Secondary | ICD-10-CM

## 2019-12-26 DIAGNOSIS — E1122 Type 2 diabetes mellitus with diabetic chronic kidney disease: Secondary | ICD-10-CM | POA: Diagnosis not present

## 2019-12-26 DIAGNOSIS — M8949 Other hypertrophic osteoarthropathy, multiple sites: Secondary | ICD-10-CM

## 2019-12-26 DIAGNOSIS — F0280 Dementia in other diseases classified elsewhere without behavioral disturbance: Secondary | ICD-10-CM

## 2019-12-26 DIAGNOSIS — I5032 Chronic diastolic (congestive) heart failure: Secondary | ICD-10-CM

## 2019-12-26 DIAGNOSIS — E785 Hyperlipidemia, unspecified: Secondary | ICD-10-CM | POA: Diagnosis not present

## 2019-12-26 DIAGNOSIS — I110 Hypertensive heart disease with heart failure: Secondary | ICD-10-CM | POA: Diagnosis not present

## 2019-12-26 DIAGNOSIS — M159 Polyosteoarthritis, unspecified: Secondary | ICD-10-CM

## 2019-12-26 DIAGNOSIS — N1831 Chronic kidney disease, stage 3a: Secondary | ICD-10-CM

## 2019-12-26 NOTE — Progress Notes (Signed)
Provider: Marlowe Sax FNP-C   Eydan Chianese, [name], NP  Patient Care Team: Thula Stewart, [name], NP as PCP - General (Family Medicine)  Extended Emergency Contact Information Primary Emergency Contact: [place], [place] Home Phone: [phone] Mobile Phone: [phone] Relation: Daughter Secondary Emergency Contact: [place], [place] Phone: [phone] Mobile Phone: [phone] Relation: Daughter  Code Status:  Full Code  Goals of care: Advanced Directive information Advanced Directives 12/26/2019  Does Patient Have a Medical Advance Directive? Yes  Type of Advance Directive [place]  Does patient want to make changes to medical advance directive? No - Patient declined  Copy of [place] in Chart? Yes - validated most recent copy scanned in chart (See row information)  Would patient like information on creating a medical advance directive? -     Chief Complaint  Patient presents with   Medical Management of Chronic Issues    3 Month Follow Up.    HPI:  Pt is a 79 y.o. female seen today for  3 months follow up for medical management of chronic diseases.she is here with her daughter.she has medical history of Type 2 DM,Hypertension,chronic diastolic congestive Heart failure,Chronic Kidney disease stage 3 ,Osteoarthrities of Multiple sites,Hyperlipidemia,Dementia without behavioral disturbances among other condition.she denies any acute issues today. Daughter states patient requires assistance with her ADL's due to high risk for falls and her cognitive impairment due to dementia.she feeds her self but requires assistance with bathing and clean up sometimes after using the toilet. She lives with her daughter.she request [facility] Nurse A[facility] to assist with her ADL's. Daughter works full time states will need paper work Visual merchandiser for [employer]. States paperwork will be send from her work place.  States appetite is good. No blood  sugar log brought in to visit but daughter states CBG ranging from 130's -140's latest Hgb A1C 6.5 states tries to avoid sugary foods and drinks. Her latest cholesterol 136, TRG 62,LDL 59   She was seen by Cardiologist 9 /30/2021 states had a cough which was suspected that was from lisinopril which was discontinued.she was started on Losartan 50 mg tablet daily.she will follow up with cardiologist as needed.   Past Medical History:  Diagnosis Date   Anterolisthesis    Grade 1 of L3 on L4 on x-ray 07/18/19   Aortic regurgitation    Seen on 2D echo 07/07/16   Cataract    CKD (chronic kidney disease), stage III (HCC)    DDD (degenerative disc disease), lumbar    Diabetes mellitus without complication (HCC)    Diastolic CHF (HCC)    Grade 2/4 with LVEF of 35-57%   Diastolic dysfunction    Grade 2/4 with LVEF of 60-65% and LVH   Gout    HLD (hyperlipidemia)    Hyperparathyroidism (HCC)    Hypertension    Lumbar facet arthropathy    Memory impairment    Mitral valve regurgitation    Seen on 2D echo 07/07/16   OA (osteoarthritis)    OSA (obstructive sleep apnea)    Osteoarthritis    Polycythemia    Shingles    Sleep apnea    Thrombocytopenia (HCC)    Vitamin D deficiency    Past Surgical History:  Procedure Laterality Date   LAPAROSCOPIC HYSTERECTOMY      Allergies  Allergen Reactions   Augmentin [Amoxicillin-Pot Clavulanate] Rash    Critically high - Onset 07/13/2019   Amoxicillin     Did it involve swelling of  the face/tongue/throat, SOB, or low BP? Y Did it involve sudden or severe rash/hives, skin peeling, or any reaction on the inside of your mouth or nose? Y (Rash, Swelling, SOB) Did you need to seek medical attention at a hospital or doctor's office? Y When did it last happen?Yesterday If all above answers are NO, may proceed with cephalosporin use.    Shellfish Allergy     Allergies as of 12/26/2019      Reactions   Augmentin  [amoxicillin-pot Clavulanate] Rash   Critically high - Onset 07/13/2019   Amoxicillin    Did it involve swelling of the face/tongue/throat, SOB, or low BP? Y Did it involve sudden or severe rash/hives, skin peeling, or any reaction on the inside of your mouth or nose? Y (Rash, Swelling, SOB) Did you need to seek medical attention at a hospital or doctor's office? Y When did it last happen?Yesterday If all above answers are NO, may proceed with cephalosporin use.   Shellfish Allergy       Medication List       Accurate as of December 26, 2019  3:39 PM. If you have any questions, ask your nurse or doctor.        allopurinol 300 MG tablet Commonly known as: ZYLOPRIM Take 1 tablet (300 mg total) by mouth daily.   amLODipine 10 MG tablet Commonly known as: NORVASC Take 1 tablet (10 mg total) by mouth daily.   aspirin 81 MG chewable tablet Chew 81 mg by mouth daily.   fluticasone 50 MCG/ACT nasal spray Commonly known as: FLONASE Place 1 spray into both nostrils 2 (two) times daily as needed for allergies.   glipiZIDE 5 MG tablet Commonly known as: GLUCOTROL Take 1 tablet (5 mg total) by mouth daily.   glucose blood test strip 1 each by Other route as needed for other. Use as instructed   hydrALAZINE 25 MG tablet Commonly known as: APRESOLINE Take 1 tablet (25 mg total) by mouth 3 (three) times daily.   Lancets Ultra Thin 30G Misc Inject 1 each into the skin daily.   levocetirizine 5 MG tablet Commonly known as: XYZAL Take 5 mg by mouth every evening.   loratadine 10 MG tablet Commonly known as: CLARITIN Take 1 tablet (10 mg total) by mouth daily.   losartan 50 MG tablet Commonly known as: COZAAR Take 1 tablet (50 mg total) by mouth daily.   metFORMIN 500 MG tablet Commonly known as: GLUCOPHAGE Take 1 tablet (500 mg total) by mouth daily.   simvastatin 20 MG tablet Commonly known as: ZOCOR Take 1 tablet (20 mg total) by mouth daily at 6 PM.        Review of Systems  Constitutional: Negative for appetite change, chills, fatigue and fever.  HENT: Negative for congestion, rhinorrhea, sinus pressure, sinus pain, sneezing and sore throat.   Eyes: Negative for discharge, redness and itching.  Respiratory: Negative for cough, chest tightness, shortness of breath and wheezing.   Cardiovascular: Negative for chest pain, palpitations and leg swelling.  Gastrointestinal: Negative for abdominal distention, abdominal pain, constipation, diarrhea, nausea and vomiting.  Endocrine: Negative for cold intolerance, heat intolerance, polydipsia, polyphagia and polyuria.  Genitourinary: Negative for difficulty urinating, dysuria, flank pain, frequency and urgency.  Musculoskeletal: Positive for arthralgias. Negative for gait problem, joint swelling, myalgias and neck pain.  Skin: Negative for color change, pallor and rash.  Neurological: Negative for dizziness, speech difficulty, weakness, light-headedness, numbness and headaches.  Hematological: Does not bruise/bleed easily.  Psychiatric/Behavioral:  Negative for agitation, behavioral problems, hallucinations and sleep disturbance. The patient is not nervous/anxious.        Forgetful     Immunization History  Administered Date(s) Administered   Fluad Quad(high Dose 65+) 11/08/2019   Influenza-Unspecified 10/31/2018   Moderna SARS-COVID-2 Vaccination 04/25/2019, 05/23/2019, 12/25/2019   Pneumococcal Conjugate-13 05/06/2015   Pneumococcal Polysaccharide-23 10/30/2013   Tdap 12/31/2013   Pertinent  Health Maintenance Due  Topic Date Due   INFLUENZA VACCINE  Completed   DEXA SCAN  Completed   PNA vac Low Risk Adult  Completed   Fall Risk  12/26/2019 09/21/2019  Falls in the past year? 0 0  Number falls in past yr: 0 0  Injury with Fall? 0 0   Functional Status Survey:    Vitals:   12/26/19 1438  BP: 130/80  Pulse: 83  Resp: 18  Temp: (!) 97.3 F (36.3 C)  SpO2: 98%   Weight: 217 lb 12.8 oz (98.8 kg)  Height: 5' 4"  (1.626 m)   Body mass index is 37.39 kg/m. Physical Exam Vitals reviewed.  Constitutional:      General: She is not in acute distress.    Appearance: She is obese. She is not ill-appearing.  HENT:     Head: Normocephalic.     Right Ear: Tympanic membrane, ear canal and external ear normal. There is no impacted cerumen.     Left Ear: Tympanic membrane, ear canal and external ear normal. There is no impacted cerumen.     Nose: Nose normal. No congestion or rhinorrhea.     Mouth/Throat:     Mouth: Mucous membranes are moist.     Pharynx: Oropharynx is clear. No oropharyngeal exudate or posterior oropharyngeal erythema.  Eyes:     General: No scleral icterus.       Right eye: No discharge.        Left eye: No discharge.     Extraocular Movements: Extraocular movements intact.     Conjunctiva/sclera: Conjunctivae normal.     Pupils: Pupils are equal, round, and reactive to light.  Neck:     Vascular: No carotid bruit.  Cardiovascular:     Rate and Rhythm: Normal rate and regular rhythm.     Pulses: Normal pulses.     Heart sounds: Normal heart sounds. No murmur heard.  No friction rub. No gallop.   Pulmonary:     Effort: Pulmonary effort is normal. No respiratory distress.     Breath sounds: Normal breath sounds. No wheezing, rhonchi or rales.  Chest:     Chest wall: No tenderness.  Abdominal:     General: Bowel sounds are normal. There is no distension.     Palpations: Abdomen is soft. There is no mass.     Tenderness: There is no abdominal tenderness. There is no right CVA tenderness, left CVA tenderness, guarding or rebound.  Musculoskeletal:        General: No swelling or tenderness. Normal range of motion.     Cervical back: Normal range of motion. No rigidity or tenderness.     Right lower leg: No edema.     Left lower leg: No edema.  Lymphadenopathy:     Cervical: No cervical adenopathy.  Skin:    General: Skin is  warm and dry.     Coloration: Skin is not pale.     Findings: No bruising, erythema or rash.  Neurological:     Mental Status: She is alert and oriented to person, place,  and time.     Cranial Nerves: No cranial nerve deficit.     Sensory: No sensory deficit.     Motor: No weakness.     Coordination: Coordination normal.     Gait: Gait normal.     Deep Tendon Reflexes: Reflexes normal.  Psychiatric:        Mood and Affect: Mood normal.        Speech: Speech normal.        Behavior: Behavior normal.        Thought Content: Thought content normal.        Cognition and Memory: Memory is impaired.        Judgment: Judgment normal.    Labs reviewed: Recent Labs    07/08/19 2120 07/08/19 2120 07/14/19 0000 09/26/19 0924 12/06/19 0758  NA 142  --  141 139 138  K 4.5   < > 4.7 4.3 4.3  CL 111   < > 107 106 104  CO2 22   < > 24* 25 24  GLUCOSE 148*  --   --  121* 127*  BUN 31*  --  35* 20 25*  CREATININE 1.28*  --  1.3* 1.17* 1.38*  CALCIUM 9.3   < > 9.9 9.8 9.7  PHOS  --   --   --   --  3.1   < > = values in this interval not displayed.   Recent Labs    07/14/19 0000 09/26/19 0924 12/06/19 0758  AST  --  18  --   ALT  --  11  --   ALKPHOS 78  --   --   BILITOT  --  0.4  --   PROT  --  7.4  --   ALBUMIN 3.7  --  4.3   Recent Labs    07/08/19 2120 07/14/19 0000 09/26/19 0924  WBC 10.4 9.6 5.0  NEUTROABS 7.2 5 1,860  HGB 12.9 13.4 12.0  HCT 39.6 40 36.3  MCV 95.4  --  93.6  PLT 283 304 291   Lab Results  Component Value Date   TSH 1.33 09/26/2019   Lab Results  Component Value Date   HGBA1C 6.5 (H) 09/26/2019   Lab Results  Component Value Date   CHOL 136 09/26/2019   HDL 63 09/26/2019   LDLCALC 59 09/26/2019   TRIG 62 09/26/2019   CHOLHDL 2.2 09/26/2019    Significant Diagnostic Results in last 30 days:  No results found.  Assessment/Plan 1. Type 2 diabetes mellitus with stage 3a chronic kidney disease, without long-term current use of insulin  (HCC) Lab Results  Component Value Date   HGBA1C 6.5 (H) 09/26/2019  - continue on Metformin 500 mg tablet daily and glipizide 5 mg tablet daily  - continue on ASA and Statin  - on ARB  - Ambulatory referral to [place] - Hemoglobin A1c; Future - Ambulatory referral to Ophthalmology for annual diabetic eye exam - Up to date on foot exam no diabetic Neuropathy.   2. Hypertensive heart disease with heart failure (HCC) B/p well controlled. - continue on amlodipine,Hydralazine and Losartan  - Ambulatory referral to [place] with Differential/Platelet; Future - CMP with eGFR([facility]); Future - TSH; Future  3. Hyperlipidemia LDL goal <70 LDL at goal. Continue on simvastatin 20 mg tablet daily  - Ambulatory referral to [place] - Lipid panel; Future  4. Chronic diastolic congestive heart failure (HCC) No abrupt weight gain or signs of fluid overload. -  Ambulatory referral to Home Health  5. Primary osteoarthritis involving multiple joints Continue on OTC analgesics. - Ambulatory referral to Home Health: Nurse Aide to assist with ADL's.   6. Late onset Alzheimer's dementia without behavioral disturbance ([place]) No Behavioral issues reported. forgetful  - Ambulatory referral to Home Health: Nurse Aide to assist with her Activity of daily Living.  Family/ staff Communication: Reviewed plan of care with patient and daughter   Labs/tests ordered:  - Hemoglobin A1c; Future - CBC with Differential/Platelet; Future - CMP with eGFR([facility]); Future - TSH; Future - Lipid panel; Future - Hemoglobin A1c; Future  Next Appointment : 4 months for medical management of chronic issues.Fasting labs 2-4 days prior to visit    Sandrea Hughs, NP

## 2019-12-27 ENCOUNTER — Telehealth: Payer: Self-pay

## 2019-12-27 NOTE — Telephone Encounter (Signed)
Daughter called and stated patient did not get Losartan with the other refills we sent in. I called the pharmacy to see why the medication was not refilled. I was told that it was 2 days too early to refill the medication, but that they would hold the Rx. I called daughter back and left a VM with CB#.

## 2019-12-27 NOTE — Telephone Encounter (Signed)
[facility] called and said they do not have the staff to cover the patient and will have to decline.

## 2019-12-27 NOTE — Telephone Encounter (Signed)
Please send referral to another Home health Agency for [facility] for wound care.

## 2019-12-28 ENCOUNTER — Telehealth: Payer: Self-pay

## 2019-12-28 NOTE — Telephone Encounter (Signed)
FMLA Paper work for patient daughter "Michelle Rowe" completed. Olin Hauser was called and notified that paper would be faxed as well as placed in the front for her to pick up a copy.

## 2020-02-04 ENCOUNTER — Other Ambulatory Visit: Payer: Self-pay | Admitting: Nurse Practitioner

## 2020-02-05 ENCOUNTER — Encounter: Payer: Self-pay | Admitting: Family

## 2020-02-05 ENCOUNTER — Ambulatory Visit (INDEPENDENT_AMBULATORY_CARE_PROVIDER_SITE_OTHER): Payer: Medicaid Other | Admitting: Family

## 2020-02-05 ENCOUNTER — Ambulatory Visit
Admission: RE | Admit: 2020-02-05 | Discharge: 2020-02-05 | Disposition: A | Discharge status: Home / Self Care | Payer: Medicaid Other | Source: Ambulatory Visit | Attending: Family | Admitting: Family

## 2020-02-05 ENCOUNTER — Other Ambulatory Visit: Payer: Self-pay

## 2020-02-05 ENCOUNTER — Other Ambulatory Visit: Payer: Medicaid Other

## 2020-02-05 VITALS — BP 142/80 | HR 69 | Temp 97.8°F | Resp 16 | Ht 64.0 in

## 2020-02-05 DIAGNOSIS — R059 Cough, unspecified: Secondary | ICD-10-CM | POA: Diagnosis not present

## 2020-02-05 DIAGNOSIS — H1033 Unspecified acute conjunctivitis, bilateral: Secondary | ICD-10-CM | POA: Diagnosis not present

## 2020-02-05 DIAGNOSIS — J069 Acute upper respiratory infection, unspecified: Secondary | ICD-10-CM | POA: Diagnosis not present

## 2020-02-05 LAB — POCT INFLUENZA A/B
Influenza A, POC: NEGATIVE
Influenza B, POC: NEGATIVE

## 2020-02-05 MED ORDER — GENTAMICIN SULFATE 0.3 % OP SOLN: 1.0000 [drp] | Freq: Three times a day (TID) | OPHTHALMIC | 0 refills | Status: AC

## 2020-02-05 MED ORDER — GUAIFENESIN 100 MG/5ML PO SOLN: 5.0000 mL | Freq: Four times a day (QID) | ORAL | 0 refills | Status: DC | PRN

## 2020-02-05 NOTE — Progress Notes (Signed)
Provider: Marlowe Sax FNP-C  Arlo Buffone, [name], NP  Patient Care Team: Moxon Messler, [name], NP as PCP - General (Family Medicine)  Extended Emergency Contact Information Primary Emergency Contact: [place], [place] Home Phone: [phone] Mobile Phone: [phone] Relation: Daughter Secondary Emergency Contact: [place], [place] Phone: [phone] Mobile Phone: [phone] Relation: Daughter  Code Status: Full Code  Goals of care: Advanced Directive information Advanced Directives 02/05/2020  Does Patient Have a Medical Advance Directive? Yes  Type of Advance Directive [place]  Does patient want to make changes to medical advance directive? No - Patient declined  Copy of [place] in Chart? Yes - validated most recent copy scanned in chart (See row information)  Would patient like information on creating a medical advance directive? -     Chief Complaint  Patient presents with  . Acute Visit    Cough and Hoarse for 3 days.     HPI:  Pt is a 79 y.o. female seen today for an acute visit for evaluation of cough and hoarseness x 3 days.she denies any fever,chills,loss of taste/sense of smell,chest tightness,chest pain or shortness of breath.Has had no generalized body aces or fatigue.Has not been in contact with person sick with COVID-19 or Flu.reports appetite good She was seen by cardiologist Dr.Manish J.Patwardhan at [facility]11/08/2019 Lisinopril was discontinued and was started on Losartan 50 mg tablet due to cough. She has a significant medical history of grade 2 Diastolic Dysfunction.     Past Medical History:  Diagnosis Date  . Anterolisthesis    Grade 1 of L3 on L4 on x-ray 07/18/19  . Aortic regurgitation    Seen on 2D echo 07/07/16  . Cataract   . CKD (chronic kidney disease), stage III ([place])   . DDD (degenerative disc disease), lumbar   . Diabetes mellitus without complication ([place])   . Diastolic  CHF (HCC)    Grade 2/4 with LVEF of 60-65%  . Diastolic dysfunction    Grade 2/4 with LVEF of 60-65% and LVH  . Gout   . HLD (hyperlipidemia)   . Hyperparathyroidism ([place])   . Hypertension   . Lumbar facet arthropathy   . Memory impairment   . Mitral valve regurgitation    Seen on 2D echo 07/07/16  . OA (osteoarthritis)   . OSA (obstructive sleep apnea)   . Osteoarthritis   . Polycythemia   . Shingles   . Sleep apnea   . Thrombocytopenia ([place])   . Vitamin D deficiency    Past Surgical History:  Procedure Laterality Date  . LAPAROSCOPIC HYSTERECTOMY      Allergies  Allergen Reactions  . Augmentin [Amoxicillin-Pot Clavulanate] Rash    Critically high - Onset 07/13/2019  . Amoxicillin     Did it involve swelling of the face/tongue/throat, SOB, or low BP? Y Did it involve sudden or severe rash/hives, skin peeling, or any reaction on the inside of your mouth or nose? Y (Rash, Swelling, SOB) Did you need to seek medical attention at a hospital or doctor's office? Y When did it last happen?Yesterday If all above answers are "NO", may proceed with cephalosporin use.   . Shellfish Allergy     Outpatient Encounter Medications as of 02/05/2020  Medication Sig  . allopurinol (ZYLOPRIM) 300 MG tablet Take 1 tablet (300 mg total) by mouth daily.  Marland Kitchen amLODipine (NORVASC) 10 MG tablet Take 1 tablet (10 mg total) by mouth daily.  Marland Kitchen aspirin 81 MG chewable tablet  Chew 81 mg by mouth daily.  . fluticasone (FLONASE) 50 MCG/ACT nasal spray USE 1 SPRAY(S) IN EACH NOSTRIL TWICE DAILY AS NEEDED FOR ALLERGIES  . gentamicin (GARAMYCIN) 0.3 % ophthalmic solution Place 1 drop into both eyes 3 (three) times daily for 7 days.  Marland Kitchen glipiZIDE (GLUCOTROL) 5 MG tablet Take 1 tablet (5 mg total) by mouth daily.  Marland Kitchen glucose blood test strip 1 each by Other route as needed for other. Use as instructed  . guaiFENesin (ROBITUSSIN) 100 MG/5ML SOLN Take 5 mLs (100 mg total) by mouth every 6 (six) hours as  needed for cough or to loosen phlegm.  . hydrALAZINE (APRESOLINE) 25 MG tablet Take 1 tablet (25 mg total) by mouth 3 (three) times daily.  . Lancets Ultra Thin 30G MISC Inject 1 each into the skin daily.  Marland Kitchen levocetirizine (XYZAL) 5 MG tablet Take 5 mg by mouth every evening.  . loratadine (CLARITIN) 10 MG tablet Take 1 tablet (10 mg total) by mouth daily.  Marland Kitchen losartan (COZAAR) 50 MG tablet Take 1 tablet (50 mg total) by mouth daily.  . metFORMIN (GLUCOPHAGE) 500 MG tablet Take 1 tablet (500 mg total) by mouth daily.  . simvastatin (ZOCOR) 20 MG tablet Take 1 tablet (20 mg total) by mouth daily at 6 PM.   No facility-administered encounter medications on file as of 02/05/2020.    Review of Systems  Constitutional: Negative for chills, fatigue and fever.  HENT: Positive for rhinorrhea. Negative for congestion, sinus pressure, sinus pain, sneezing and trouble swallowing.        Hoarseness   Eyes: Positive for redness. Negative for discharge, itching and visual disturbance.  Respiratory: Positive for cough. Negative for chest tightness, shortness of breath and wheezing.   Cardiovascular: Negative for chest pain, palpitations and leg swelling.  Gastrointestinal: Negative for abdominal distention, abdominal pain, diarrhea, nausea and vomiting.  Skin: Negative for pallor and rash.  Neurological: Negative for dizziness, speech difficulty, weakness, light-headedness and headaches.  Hematological: Does not bruise/bleed easily.  Psychiatric/Behavioral: Positive for confusion. Negative for agitation and sleep disturbance. The patient is not nervous/anxious.     Immunization History  Administered Date(s) Administered  . Fluad Quad(high Dose 65+) 11/08/2019  . Influenza-Unspecified 10/31/2018  . Moderna Sars-Covid-2 Vaccination 04/25/2019, 05/23/2019, 12/25/2019  . Pneumococcal Conjugate-13 05/06/2015  . Pneumococcal Polysaccharide-23 10/30/2013  . Tdap 12/31/2013   Pertinent  Health Maintenance  Due  Topic Date Due  . INFLUENZA VACCINE  Completed  . DEXA SCAN  Completed  . PNA vac Low Risk Adult  Completed   Fall Risk  02/05/2020 12/26/2019 09/21/2019  Falls in the past year? 0 0 0  Number falls in past yr: 0 0 0  Injury with Fall? 0 0 0   Functional Status Survey:    Vitals:   02/05/20 1330  BP: (!) 142/80  Pulse: 69  Resp: 16  Temp: 97.8 F (36.6 C)  SpO2: 98%  Height: 5\' 4"  (1.626 m)   Body mass index is 37.39 kg/m. Physical Exam Vitals reviewed.  Constitutional:      General: She is not in acute distress.    Appearance: She is not ill-appearing.  HENT:     Head: Normocephalic.     Right Ear: Tympanic membrane, ear canal and external ear normal. There is no impacted cerumen.     Left Ear: Tympanic membrane, ear canal and external ear normal. There is no impacted cerumen.     Nose: Nose normal. No congestion or rhinorrhea.  Mouth/Throat:     Mouth: Mucous membranes are moist.     Pharynx: Oropharynx is clear. No oropharyngeal exudate or posterior oropharyngeal erythema.  Eyes:     General: No scleral icterus.       Right eye: Discharge present.        Left eye: Discharge present.    Conjunctiva/sclera: Conjunctivae normal.     Pupils: Pupils are equal, round, and reactive to light.     Comments: Yellow thick drainage noted on eyelashes   Cardiovascular:     Rate and Rhythm: Normal rate and regular rhythm.     Pulses: Normal pulses.     Heart sounds: Normal heart sounds. No murmur heard. No friction rub. No gallop.   Pulmonary:     Effort: Pulmonary effort is normal. No respiratory distress.     Breath sounds: No wheezing, rhonchi or rales.     Comments: Bilateral lung bases diminished clear  Chest:     Chest wall: No tenderness.  Abdominal:     General: Bowel sounds are normal. There is no distension.     Palpations: Abdomen is soft. There is no mass.     Tenderness: There is no abdominal tenderness. There is no right CVA tenderness, left CVA  tenderness, guarding or rebound.  Musculoskeletal:        General: No swelling or tenderness. Normal range of motion.     Cervical back: Normal range of motion. No rigidity or tenderness.     Right lower leg: No edema.     Left lower leg: No edema.  Lymphadenopathy:     Cervical: No cervical adenopathy.  Skin:    General: Skin is warm and dry.     Coloration: Skin is not pale.     Findings: No bruising, erythema or rash.  Neurological:     Mental Status: She is alert and oriented to person, place, and time.  Psychiatric:        Mood and Affect: Mood normal.        Behavior: Behavior normal.        Thought Content: Thought content normal.        Judgment: Judgment normal.     Labs reviewed: Recent Labs    07/08/19 2120 07/14/19 0000 09/26/19 0924 12/06/19 0758  NA 142 141 139 138  K 4.5 4.7 4.3 4.3  CL 111 107 106 104  CO2 22 24* 25 24  GLUCOSE 148*  --  121* 127*  BUN 31* 35* 20 25*  CREATININE 1.28* 1.3* 1.17* 1.38*  CALCIUM 9.3 9.9 9.8 9.7  PHOS  --   --   --  3.1   Recent Labs    07/14/19 0000 09/26/19 0924 12/06/19 0758  AST  --  18  --   ALT  --  11  --   ALKPHOS 78  --   --   BILITOT  --  0.4  --   PROT  --  7.4  --   ALBUMIN 3.7  --  4.3   Recent Labs    07/08/19 2120 07/14/19 0000 09/26/19 0924  WBC 10.4 9.6 5.0  NEUTROABS 7.2 5 1,860  HGB 12.9 13.4 12.0  HCT 39.6 40 36.3  MCV 95.4  --  93.6  PLT 283 304 291   Lab Results  Component Value Date   TSH 1.33 09/26/2019   Lab Results  Component Value Date   HGBA1C 6.5 (H) 09/26/2019   Lab Results  Component Value  Date   CHOL 136 09/26/2019   HDL 63 09/26/2019   LDLCALC 59 09/26/2019   TRIG 62 09/26/2019   CHOLHDL 2.2 09/26/2019    Significant Diagnostic Results in last 30 days:  DG Chest 2 View  Result Date: 02/06/2020 CLINICAL DATA:  cough EXAM: CHEST - 2 VIEW COMPARISON:  07/08/2019 . FINDINGS: No focal consolidation. No pneumothorax or pleural effusion. Stable cardiomediastinal  silhouette. Multilevel spondylosis. IMPRESSION: No focal airspace disease. Mild cardiomegaly, unchanged. Electronically Signed   By: Primitivo Gauze M.D.   On: 02/06/2020 08:44    Assessment/Plan 1. Upper respiratory tract infection, unspecified type Afebrile.cough and hoarseness noted. - POC Influenza A/B negative  - SARS-COV-2 RNA,(COVID-19) QUAL NAAT  - advised to increase fluid intake.  2. Cough Afebrile.bilateral Lung bases diminished.  - DG Chest 2 View; Future - guaiFENesin (ROBITUSSIN) 100 MG/5ML SOLN; Take 5 mLs (100 mg total) by mouth every 6 (six) hours as needed for cough or to loosen phlegm.  Dispense: 236 mL; Refill: 0 - Advised to get chest X-ray done at [place] at [facility] over [address].then will call with results.Addressed provided also on AVS. - Advised to notify provider or go to ED if having any shortness of breath or wheezing.   3. Acute bacterial conjunctivitis of both eyes Bilateral eyelashes with yellow thick drainage. - Instructed to cleanse both eyes with warm water wash cloth then apply gentamicin eye drops three times daily x 7 days - gentamicin (GARAMYCIN) 0.3 % ophthalmic solution; Place 1 drop into both eyes 3 (three) times daily for 7 days.  Dispense: 5 mL; Refill: 0 - Notify provider if symptoms worsen or fail to improve  Family/ staff Communication: Reviewed plan of care with patient and daughter verbalized understanding.   Labs/tests ordered:  - DG Chest 2 View; Future - POC Influenza A/B negative  - SARS-COV-2 RNA,(COVID-19) QUAL NAAT  Next Appointment: As needed if symptoms worsen or fail to improve   Sandrea Hughs, NP

## 2020-02-05 NOTE — Patient Instructions (Addendum)
-   Please get chest X-ray at [facility] at [address] over [place].then will call with results.  - Notify provider or go to ED if having any shortness of breath or wheezing.   - cleanse both eyes with warm water wash cloth then apply gentamicin eye drops three times daily x 7 days

## 2020-02-07 LAB — SARS-COV-2 RNA,(COVID-19) QUALITATIVE NAAT: SARS CoV2 RNA: NOT DETECTED

## 2020-02-13 ENCOUNTER — Telehealth: Payer: Self-pay | Admitting: Family

## 2020-02-13 NOTE — Telephone Encounter (Signed)
Over the counter Robitussin- Dm 5-10 ml every 8 hrs as needed for cough.

## 2020-02-13 NOTE — Telephone Encounter (Signed)
Pt was seen by [name] last week, has had Covid & neg. She has had chest xray & its neg too.  Pt is now coughing up mucous & coughing a lot. Daughter wants to know what she can give her along with her other medications & issues.  Please advise, thanks Vilinda Blanks

## 2020-02-13 NOTE — Telephone Encounter (Signed)
Called patient and no answer. Voicemail was left with office call back number.   

## 2020-02-14 NOTE — Telephone Encounter (Signed)
Noted  

## 2020-02-18 ENCOUNTER — Telehealth: Payer: Self-pay | Admitting: *Deleted

## 2020-02-18 NOTE — Telephone Encounter (Signed)
Pharmacist called from [facility] and stated that they do not have the Michelle Rowe in stock that was ordered because it is OTC Robitussin. Wanted to know if you suggest patient getting Robitussin OTC with her being a Diabetic or changed the medication to something different.  Please Advise.

## 2020-02-18 NOTE — Telephone Encounter (Signed)
May substitute with OTC Robitussin appropriate for diabetic.

## 2020-02-18 NOTE — Telephone Encounter (Signed)
LMOM to return call.

## 2020-02-19 NOTE — Telephone Encounter (Signed)
Take mucinex 600 mg tablet one by mouth twice daily for cough.

## 2020-02-19 NOTE — Telephone Encounter (Signed)
Patient daughter, Olin Hauser notified and agreed. Medication list updated.

## 2020-02-19 NOTE — Telephone Encounter (Signed)
Spoke with Olin Hauser, daughter and she stated that there is no Robitussin OTC for diabetic. The Pharmacist tried to help her find something and there is nothing OTC Robitussin for a Diabetic. Stated patient has a terrible cough and wants to know what you advise. (wants it sent to the pharmacy.) Please Advise.

## 2020-03-17 ENCOUNTER — Other Ambulatory Visit: Payer: Self-pay | Admitting: Nurse Practitioner

## 2020-03-22 ENCOUNTER — Other Ambulatory Visit: Payer: Self-pay | Admitting: Family

## 2020-03-26 ENCOUNTER — Other Ambulatory Visit: Payer: Self-pay | Admitting: *Deleted

## 2020-03-26 DIAGNOSIS — I10 Essential (primary) hypertension: Secondary | ICD-10-CM

## 2020-03-26 NOTE — Telephone Encounter (Signed)
Spoke with Michelle Rowe directly.  Explained to her that patient has been taking 7 Simvastatin a day. Together we reviewed the directions in patient's current medication list for the Simvastatin.  Michelle Rowe recommended patient to schedule an appointment to follow up and go over medications and do labs.   I called and spoke with daughter, Michelle Rowe, and she stated that she will bring patient in tomorrow. Appointment scheduled.

## 2020-03-26 NOTE — Telephone Encounter (Signed)
Patient daughter, Olin Hauser, called and stated that she needs a refill on patient's cholesterol medication, Simvastatin.And all of her other medications (90day supply) Stated that it was denied by our office and patient is out.  Daughter stated that patient is taking Simvastatin '20mg'$  One in the morning and 6 in the evening.   Daughter stated that that is what the Rx stated and that is how she has been giving the medication. Stated that she also confirmed this with the pharmacy and they told her that is how the Rx was written.    Current medication list: simvastatin (ZOCOR) 20 MG tablet  TAKE 1 TABLET BY MOUTH ONCE DAILY 6 IN THE EVENING 0 ordered       Is this correct. And if not, patient daughter wants to know if this is going to hurt patient.   Please Advise.  Pended Rx's and sent to [facility] for approval.

## 2020-03-26 NOTE — Telephone Encounter (Signed)
Orders on chart is simvastatin 20 mg tablet daily in the evening.Patient used to be on Lisinopril 20 mg tablet twice daily which was discontinued by cardiologist did you mixed the two medication?  - Per previous visit you were supposed to schedule for a follow up appointment in [place]  With fasting blood work.  Will refill medication for one month until we see you next month and get lab work done.

## 2020-03-27 ENCOUNTER — Ambulatory Visit (INDEPENDENT_AMBULATORY_CARE_PROVIDER_SITE_OTHER): Payer: Medicaid Other | Admitting: Family

## 2020-03-27 ENCOUNTER — Encounter: Payer: Self-pay | Admitting: Family

## 2020-03-27 ENCOUNTER — Other Ambulatory Visit: Payer: Self-pay

## 2020-03-27 VITALS — BP 130/62 | HR 59 | Temp 98.0°F | Resp 16 | Ht 64.0 in | Wt 215.0 lb

## 2020-03-27 DIAGNOSIS — F0280 Dementia in other diseases classified elsewhere without behavioral disturbance: Secondary | ICD-10-CM

## 2020-03-27 DIAGNOSIS — M159 Polyosteoarthritis, unspecified: Secondary | ICD-10-CM

## 2020-03-27 DIAGNOSIS — I110 Hypertensive heart disease with heart failure: Secondary | ICD-10-CM | POA: Diagnosis not present

## 2020-03-27 DIAGNOSIS — I5032 Chronic diastolic (congestive) heart failure: Secondary | ICD-10-CM

## 2020-03-27 DIAGNOSIS — M8949 Other hypertrophic osteoarthropathy, multiple sites: Secondary | ICD-10-CM | POA: Diagnosis not present

## 2020-03-27 DIAGNOSIS — E785 Hyperlipidemia, unspecified: Secondary | ICD-10-CM

## 2020-03-27 DIAGNOSIS — G301 Alzheimer's disease with late onset: Secondary | ICD-10-CM

## 2020-03-27 DIAGNOSIS — E1122 Type 2 diabetes mellitus with diabetic chronic kidney disease: Secondary | ICD-10-CM

## 2020-03-27 DIAGNOSIS — N1831 Chronic kidney disease, stage 3a: Secondary | ICD-10-CM

## 2020-03-27 MED ORDER — ALLOPURINOL 300 MG PO TABS: 300.0000 mg | ORAL_TABLET | Freq: Every day | ORAL | 1 refills | Status: DC

## 2020-03-27 MED ORDER — GLIPIZIDE 5 MG PO TABS: 5.0000 mg | ORAL_TABLET | Freq: Every day | ORAL | 1 refills | Status: DC

## 2020-03-27 MED ORDER — AMLODIPINE BESYLATE 10 MG PO TABS: 10.0000 mg | ORAL_TABLET | Freq: Every day | ORAL | 1 refills | Status: DC

## 2020-03-27 MED ORDER — SIMVASTATIN 20 MG PO TABS: 20.0000 mg | ORAL_TABLET | Freq: Every day | ORAL | 3 refills | Status: DC

## 2020-03-27 MED ORDER — LOSARTAN POTASSIUM 50 MG PO TABS
50.0000 mg | ORAL_TABLET | Freq: Every day | ORAL | 1 refills | Status: DC
Start: 2020-03-27 — End: 2020-10-03

## 2020-03-27 MED ORDER — METFORMIN HCL 500 MG PO TABS: 500.0000 mg | ORAL_TABLET | Freq: Every day | ORAL | 1 refills | Status: DC

## 2020-03-27 MED ORDER — HYDRALAZINE HCL 25 MG PO TABS: 25.0000 mg | ORAL_TABLET | Freq: Three times a day (TID) | ORAL | 1 refills | Status: DC

## 2020-03-27 NOTE — Patient Instructions (Signed)
-    Home Health referral ordered Agency will call you for appointment  - Labs drawn today will call you with results

## 2020-03-27 NOTE — Progress Notes (Signed)
Provider: Marlowe Sax FNP-C  Michelle Rowe, Michelle Bucks, NP  Patient Care Team: Mahika Vanvoorhis, Michelle Bucks, NP as PCP - General (Family Medicine)  Extended Emergency Contact Information Primary Emergency Contact: Michelle, Rowe Home Phone: [phone] Mobile Phone: [phone] Relation: Daughter Secondary Emergency Contact: Michelle, Rowe Phone: [phone] Mobile Phone: [phone] Relation: Daughter  Code Status: Full Code  Goals of care: Advanced Directive information Advanced Directives 03/27/2020  Does Patient Have a Medical Advance Directive? Yes  Type of Advance Directive [place]  Does patient want to make changes to medical advance directive? No - Patient declined  Copy of [place] in Chart? Yes - validated most recent copy scanned in chart (See row information)  Would patient like information on creating a medical advance directive? -     Chief Complaint  Patient presents with  . Acute Visit    Review medication     HPI:  Pt is a 80 y.o. female seen today for an acute visit for evaluation of medication and face to face for Home health referral. She is here with her daughter.She denies any acute issues.Daughter states has been giving simvastatin 20 mg tablet one in the morning and 6 in the evening thought direction said give 6 tablets in the evening.she run out of the 30 pills given and was requested refilled but was too soon that's when she realized had been administering extra dose in the evening.she denies any muscle aches,weakness or pain. Daughter states next time when in doubt will call provider's office to clarify instruction.  She is due for lab work today. Also needs another referral to a [facility] for a Nurse Assistant to assist with her activity of daily living.A HH aid order was previously placed but referral co-ordinate states most home health agency were not taking Medicaid or had shortage of staff due to COVID-19  Pandemic.  Daughter states has found Public librarian who will come and do an assessment would like referral send to [facility].  She follows up with [facility] for her diabetic foot exam.States no numbness or tingling.   Has also seen her Ophthalmologist Dr. Thurston Hole  since previous visit. 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 Midge Aver for lensectomy but states did not need any surgery.No eye glasses were prescribed until after lensectomy per notes. No diabetic retinopathy noted.    Past Medical History:  Diagnosis Date  . Anterolisthesis    Grade 1 of L3 on L4 on x-ray 07/18/19  . Aortic regurgitation    Seen on 2D echo 07/07/16  . Cataract   . CKD (chronic kidney disease), stage III ([place])   . DDD (degenerative disc disease), lumbar   . Diabetes mellitus without complication ([place])   . Diastolic CHF (HCC)    Grade 2/4 with LVEF of 60-65%  . Diastolic dysfunction    Grade 2/4 with LVEF of 60-65% and LVH  . Gout   . HLD (hyperlipidemia)   . Hyperparathyroidism ([place])   . Hypertension   . Lumbar facet arthropathy   . Memory impairment   . Mitral valve regurgitation    Seen on 2D echo 07/07/16  . OA (osteoarthritis)   . OSA (obstructive sleep apnea)   . Osteoarthritis   . Polycythemia   . Shingles   . Sleep apnea   . Thrombocytopenia ([place])   . Vitamin D deficiency    Past Surgical History:  Procedure  Laterality Date  . LAPAROSCOPIC HYSTERECTOMY      Allergies  Allergen Reactions  . Augmentin [Amoxicillin-Pot Clavulanate] Rash    Critically high - Onset 07/13/2019  . Amoxicillin     Did it involve swelling of the face/tongue/throat, SOB, or low BP? Y Did it involve sudden or severe rash/hives, skin peeling, or any reaction on the inside of your mouth or nose? Y (Rash, Swelling, SOB) Did you need to seek medical attention at a hospital or  doctor's office? Y When did it last happen?Yesterday If all above answers are "NO", may proceed with cephalosporin use.   . Shellfish Allergy     Outpatient Encounter Medications as of 03/27/2020  Medication Sig  . allopurinol (ZYLOPRIM) 300 MG tablet Take 1 tablet (300 mg total) by mouth daily.  Marland Kitchen amLODipine (NORVASC) 10 MG tablet Take 1 tablet (10 mg total) by mouth daily.  Marland Kitchen aspirin 81 MG chewable tablet Chew 81 mg by mouth daily.  . fluticasone (FLONASE) 50 MCG/ACT nasal spray USE 1 SPRAY(S) IN EACH NOSTRIL TWICE DAILY AS NEEDED FOR ALLERGIES  . glipiZIDE (GLUCOTROL) 5 MG tablet Take 1 tablet (5 mg total) by mouth daily.  Marland Kitchen glucose blood test strip 1 each by Other route as needed for other. Use as instructed  . guaiFENesin (MUCINEX) 600 MG 12 hr tablet Take 600 mg by mouth 2 (two) times daily.  . hydrALAZINE (APRESOLINE) 25 MG tablet Take 1 tablet (25 mg total) by mouth 3 (three) times daily.  . Lancets Ultra Thin 30G MISC Inject 1 each into the skin daily.  Marland Kitchen levocetirizine (XYZAL) 5 MG tablet Take 5 mg by mouth every evening.  . loratadine (CLARITIN) 10 MG tablet Take 1 tablet (10 mg total) by mouth daily.  Marland Kitchen losartan (COZAAR) 50 MG tablet Take 1 tablet (50 mg total) by mouth daily.  . metFORMIN (GLUCOPHAGE) 500 MG tablet Take 1 tablet (500 mg total) by mouth daily.  . simvastatin (ZOCOR) 20 MG tablet TAKE 1 TABLET BY MOUTH ONCE DAILY 6 IN THE EVENING   No facility-administered encounter medications on file as of 03/27/2020.    Review of Systems  Constitutional: Negative for appetite change, chills, fatigue and fever.  HENT: Positive for rhinorrhea. Negative for congestion, postnasal drip, sinus pressure, sinus pain, sneezing, sore throat and trouble swallowing.        Chronic rhinorrhea on allergy medication   Eyes: Negative for discharge, redness, itching and visual disturbance.       Follows up with Ophthalmology   Respiratory: Negative for cough, chest tightness,  shortness of breath and wheezing.   Cardiovascular: Negative for chest pain, palpitations and leg swelling.  Gastrointestinal: Negative for abdominal distention, abdominal pain, constipation, diarrhea, nausea and vomiting.  Endocrine: Negative for cold intolerance, heat intolerance, polydipsia, polyphagia and polyuria.  Genitourinary: Negative for difficulty urinating, dysuria, flank pain, frequency and urgency.  Musculoskeletal: Positive for arthralgias. Negative for back pain, gait problem, joint swelling and myalgias.       Right knee pain under controlled after she had steroid injection with Orthopedic.   Skin: Negative for color change, pallor and rash.  Neurological: Negative for dizziness, seizures, speech difficulty, weakness, light-headedness and headaches.  Hematological: Does not bruise/bleed easily.  Psychiatric/Behavioral: Positive for confusion. Negative for agitation, behavioral problems and sleep disturbance. The patient is not nervous/anxious.     Immunization History  Administered Date(s) Administered  . Fluad Quad(high Dose 65+) 11/08/2019  . Influenza-Unspecified 10/31/2018  . Moderna Sars-Covid-2 Vaccination 04/25/2019, 05/23/2019,  12/25/2019  . Pneumococcal Conjugate-13 05/06/2015  . Pneumococcal Polysaccharide-23 10/30/2013  . Tdap 12/31/2013   Pertinent  Health Maintenance Due  Topic Date Due  . INFLUENZA VACCINE  Completed  . DEXA SCAN  Completed  . PNA vac Low Risk Adult  Completed   Fall Risk  03/27/2020 02/05/2020 12/26/2019 09/21/2019  Falls in the past year? 0 0 0 0  Number falls in past yr: 0 0 0 0  Injury with Fall? 0 0 0 0   Functional Status Survey:    Vitals:   03/27/20 1032  BP: 130/62  Pulse: (!) 59  Resp: 16  Temp: 98 F (36.7 C)  SpO2: 98%  Weight: 215 lb (97.5 kg)  Height: _0  (1.626 m)   Body mass index is 36.9 kg/m. Physical Exam Constitutional:      General: She is not in acute distress.    Appearance: She is obese. She is  not ill-appearing.  HENT:     Head: Normocephalic.     Right Ear: Tympanic membrane, ear canal and external ear normal. There is no impacted cerumen.     Left Ear: Tympanic membrane, ear canal and external ear normal. There is no impacted cerumen.     Nose: Nose normal. No congestion or rhinorrhea.     Mouth/Throat:     Mouth: Mucous membranes are moist.     Pharynx: Oropharynx is clear. No oropharyngeal exudate or posterior oropharyngeal erythema.  Eyes:     General: No scleral icterus.       Right eye: No discharge.        Left eye: No discharge.     Conjunctiva/sclera: Conjunctivae normal.     Pupils: Pupils are equal, round, and reactive to light.  Neck:     Vascular: No carotid bruit.  Cardiovascular:     Rate and Rhythm: Normal rate and regular rhythm.     Pulses: Normal pulses.     Heart sounds: Normal heart sounds. No murmur heard. No friction rub. No gallop.   Pulmonary:     Effort: Pulmonary effort is normal. No respiratory distress.     Breath sounds: Normal breath sounds. No wheezing, rhonchi or rales.  Chest:     Chest wall: No tenderness.  Abdominal:     General: Bowel sounds are normal. There is no distension.     Palpations: Abdomen is soft. There is no mass.     Tenderness: There is no abdominal tenderness. There is no right CVA tenderness, left CVA tenderness, guarding or rebound.  Musculoskeletal:        General: No swelling or tenderness. Normal range of motion.     Cervical back: Normal range of motion. No rigidity or tenderness.     Right lower leg: No edema.     Left lower leg: No edema.  Lymphadenopathy:     Cervical: No cervical adenopathy.  Skin:    General: Skin is warm and dry.     Coloration: Skin is not pale.     Findings: No bruising, erythema or rash.  Neurological:     Mental Status: She is alert. Mental status is at baseline.     Cranial Nerves: No cranial nerve deficit.     Sensory: No sensory deficit.     Motor: No weakness.     Gait:  Gait normal.  Psychiatric:        Mood and Affect: Mood normal.        Speech: Speech normal.  Behavior: Behavior normal.        Thought Content: Thought content normal.        Cognition and Memory: Memory is impaired.     Labs reviewed: Recent Labs    07/08/19 2120 07/14/19 0000 09/26/19 0924 12/06/19 0758  NA 142 141 139 138  K 4.5 4.7 4.3 4.3  CL 111 107 106 104  CO2 22 24* 25 24  GLUCOSE 148*  --  121* 127*  BUN 31* 35* 20 25*  CREATININE 1.28* 1.3* 1.17* 1.38*  CALCIUM 9.3 9.9 9.8 9.7  PHOS  --   --   --  3.1   Recent Labs    07/14/19 0000 09/26/19 0924 12/06/19 0758  AST  --  18  --   ALT  --  11  --   ALKPHOS 78  --   --   BILITOT  --  0.4  --   PROT  --  7.4  --   ALBUMIN 3.7  --  4.3   Recent Labs    07/08/19 2120 07/14/19 0000 09/26/19 0924  WBC 10.4 9.6 5.0  NEUTROABS 7.2 5 1,860  HGB 12.9 13.4 12.0  HCT 39.6 40 36.3  MCV 95.4  --  93.6  PLT 283 304 291   Lab Results  Component Value Date   TSH 1.33 09/26/2019   Lab Results  Component Value Date   HGBA1C 6.5 (H) 09/26/2019   Lab Results  Component Value Date   CHOL 136 09/26/2019   HDL 63 09/26/2019   LDLCALC 59 09/26/2019   TRIG 62 09/26/2019   CHOLHDL 2.2 09/26/2019    Significant Diagnostic Results in last 30 days:  No results found.  Assessment/Plan 1. Hyperlipidemia LDL goal <70 Previous LDL at goal.  - continue on simvastatin 20 mg tablet daily at 6 PM  - low carbohydrates,low saturated fats and high vegetable diet recommended.Exercise as tolerated.  - simvastatin (ZOCOR) 20 MG tablet; Take 1 tablet (20 mg total) by mouth daily at 6 PM.  Dispense: 30 tablet; Refill: 3 - Lipid panel  2. Hypertensive heart disease with heart failure (HCC) B/p well controlled. - continue on amlodipine,losartan and Hydralazine  - TSH - CMP with eGFR([facility]) - CBC with Differential/Platelet  3. Chronic diastolic congestive heart failure (HCC) No signs of fluid overload. -  Ambulatory referral to [facility] with her ADL's.   4. Primary osteoarthritis involving multiple joints Continue to follow up with Orthopedic specialist as directed.  - Ambulatory referral to Home Health  5. Late onset Alzheimer's dementia without behavioral disturbance ([place]) Continue with supportive care. Care give support Kit book given to daughter along with [facility] brochure for day Care information.  - Ambulatory referral to [facility] for a Nurse Assistant to assist with her ADL's.Daughter gave [facility] telephone Number to [facility] during visit to give to [facility] referral co-ordinate Kathyrn Lass.   6. Type 2 diabetes mellitus with stage 3a chronic kidney disease, without long-term current use of insulin (HCC) CBG 140 this morning. - continue on metformin  Will recheck lab work today. - On ARB,ASA and Statin. - Dietary and lifestyle modification advised.  Annual Foot and eye exam is up to date.  - Hemoglobin A1c  Family/ staff Communication: Reviewed plan of care with patient and daughter.   Labs/tests ordered: Has pending lab orders in place.   Next Appointment: 6 months for medical management of chronic issues.   Sandrea Hughs, NP

## 2020-03-27 NOTE — Telephone Encounter (Signed)
Patient daughter is bringing patient in today for an appointment with [facility].

## 2020-03-28 LAB — LIPID PANEL
Cholesterol: 149 mg/dL (ref <200)
HDL: 65 mg/dL (ref >=50)
LDL Cholesterol (Calc): 64 mg/dL (calc)
Non-HDL Cholesterol (Calc): 84 mg/dL (calc) (ref <130)
Total CHOL/HDL Ratio: 2.3 (calc) (ref <5.0)
Triglycerides: 115 mg/dL (ref <150)

## 2020-03-28 LAB — CBC WITH DIFFERENTIAL/PLATELET
Absolute Monocytes: 442 cells/uL (ref 200–950)
Basophils Absolute: 38 cells/uL (ref 0–200)
Basophils Relative: 0.6 %
Eosinophils Absolute: 38 cells/uL (ref 15–500)
Eosinophils Relative: 0.6 %
HCT: 35.5 % (ref 35.0–45.0)
Hemoglobin: 11.7 g/dL (ref 11.7–15.5)
Lymphs Abs: 3014 cells/uL (ref 850–3900)
MCH: 31.3 pg (ref 27.0–33.0)
MCHC: 33 g/dL (ref 32.0–36.0)
MCV: 94.9 fL (ref 80.0–100.0)
MPV: 9.1 fL (ref 7.5–12.5)
Monocytes Relative: 6.9 %
Neutro Abs: 2867 cells/uL (ref 1500–7800)
Neutrophils Relative %: 44.8 %
Platelets: 293 10*3/uL (ref 140–400)
RBC: 3.74 10*6/uL — ABNORMAL LOW (ref 3.80–5.10)
RDW: 13.2 % (ref 11.0–15.0)
Total Lymphocyte: 47.1 %
WBC: 6.4 10*3/uL (ref 3.8–10.8)

## 2020-03-28 LAB — COMPLETE METABOLIC PANEL WITH GFR
AG Ratio: 1.4 (calc) (ref 1.0–2.5)
ALT: 10 U/L (ref 6–29)
AST: 17 U/L (ref 10–35)
Albumin: 4.1 g/dL (ref 3.6–5.1)
Alkaline phosphatase (APISO): 62 U/L (ref 37–153)
BUN/Creatinine Ratio: 24 (calc) — ABNORMAL HIGH (ref 6–22)
BUN: 43 mg/dL — ABNORMAL HIGH (ref 7–25)
CO2: 22 mmol/L (ref 20–32)
Calcium: 9.6 mg/dL (ref 8.6–10.4)
Chloride: 109 mmol/L (ref 98–110)
Creat: 1.76 mg/dL — ABNORMAL HIGH (ref 0.60–0.88)
GFR, Est African American: 31 mL/min/{1.73_m2} — ABNORMAL LOW (ref >=60)
GFR, Est Non African American: 27 mL/min/{1.73_m2} — ABNORMAL LOW (ref >=60)
Globulin: 3 g/dL (calc) (ref 1.9–3.7)
Glucose, Bld: 69 mg/dL (ref 65–99)
Potassium: 4.8 mmol/L (ref 3.5–5.3)
Sodium: 141 mmol/L (ref 135–146)
Total Bilirubin: 0.3 mg/dL (ref 0.2–1.2)
Total Protein: 7.1 g/dL (ref 6.1–8.1)

## 2020-03-28 LAB — HEMOGLOBIN A1C
Hgb A1c MFr Bld: 6.5 % of total Hgb — ABNORMAL HIGH (ref <5.7)
Mean Plasma Glucose: 140 mg/dL
eAG (mmol/L): 7.7 mmol/L

## 2020-03-28 LAB — TSH: TSH: 1.41 mIU/L (ref 0.40–4.50)

## 2020-03-31 ENCOUNTER — Other Ambulatory Visit: Payer: Self-pay

## 2020-03-31 DIAGNOSIS — I110 Hypertensive heart disease with heart failure: Secondary | ICD-10-CM

## 2020-03-31 DIAGNOSIS — E1122 Type 2 diabetes mellitus with diabetic chronic kidney disease: Secondary | ICD-10-CM

## 2020-03-31 DIAGNOSIS — E785 Hyperlipidemia, unspecified: Secondary | ICD-10-CM

## 2020-03-31 MED ORDER — SIMVASTATIN 10 MG PO TABS: 10.0000 mg | ORAL_TABLET | Freq: Every day | ORAL | 3 refills | Status: DC

## 2020-04-09 ENCOUNTER — Ambulatory Visit: Payer: Medicaid Other | Admitting: Internal Medicine

## 2020-04-09 ENCOUNTER — Other Ambulatory Visit: Payer: Self-pay | Admitting: Family

## 2020-04-09 DIAGNOSIS — Z1231 Encounter for screening mammogram for malignant neoplasm of breast: Secondary | ICD-10-CM

## 2020-06-16 ENCOUNTER — Encounter: Payer: Self-pay | Admitting: Family

## 2020-06-16 NOTE — Progress Notes (Signed)
FMLA paper work completed and placed on to be faxed box.

## 2020-06-27 ENCOUNTER — Other Ambulatory Visit: Payer: Self-pay | Admitting: Family

## 2020-08-04 ENCOUNTER — Ambulatory Visit: Payer: Self-pay | Admitting: Family

## 2020-08-07 ENCOUNTER — Other Ambulatory Visit: Payer: Self-pay

## 2020-08-07 ENCOUNTER — Ambulatory Visit (INDEPENDENT_AMBULATORY_CARE_PROVIDER_SITE_OTHER): Payer: Medicaid Other | Admitting: Family

## 2020-08-07 ENCOUNTER — Encounter: Payer: Self-pay | Admitting: Family

## 2020-08-07 VITALS — BP 130/80 | HR 57 | Temp 96.9°F | Resp 18 | Ht 64.0 in | Wt 205.8 lb

## 2020-08-07 DIAGNOSIS — E785 Hyperlipidemia, unspecified: Secondary | ICD-10-CM

## 2020-08-07 DIAGNOSIS — I110 Hypertensive heart disease with heart failure: Secondary | ICD-10-CM

## 2020-08-07 DIAGNOSIS — I5032 Chronic diastolic (congestive) heart failure: Secondary | ICD-10-CM

## 2020-08-07 DIAGNOSIS — F0280 Dementia in other diseases classified elsewhere without behavioral disturbance: Secondary | ICD-10-CM

## 2020-08-07 DIAGNOSIS — M8949 Other hypertrophic osteoarthropathy, multiple sites: Secondary | ICD-10-CM

## 2020-08-07 DIAGNOSIS — G301 Alzheimer's disease with late onset: Secondary | ICD-10-CM

## 2020-08-07 DIAGNOSIS — E1122 Type 2 diabetes mellitus with diabetic chronic kidney disease: Secondary | ICD-10-CM

## 2020-08-07 DIAGNOSIS — N1831 Chronic kidney disease, stage 3a: Secondary | ICD-10-CM

## 2020-08-07 DIAGNOSIS — M159 Polyosteoarthritis, unspecified: Secondary | ICD-10-CM

## 2020-08-07 NOTE — Progress Notes (Signed)
Provider: Marlowe Sax FNP-C   [name], [name], NP  Patient Care Team: Michelle Rowe, [name], NP as PCP - General (Family Medicine)  Extended Emergency Contact Information Primary Emergency Contact: Michelle Rowe, Michelle Rowe Home Phone: [phone] Mobile Phone: [phone] Relation: Daughter Secondary Emergency Contact: Michelle Rowe, Michelle Rowe Phone: [phone] Mobile Phone: [phone] Relation: Daughter  Code Status:  Full Code  Goals of care: Advanced Directive information Advanced Directives 08/07/2020  Does Patient Have a Medical Advance Directive? No  Type of Advance Directive -  Does patient want to make changes to medical advance directive? -  Copy of [place] in Chart? -  Would patient like information on creating a medical advance directive? No - Patient declined     Chief Complaint  Patient presents with   Medical Management of Chronic Issues    4 month follow up.   Labs    Fasting Labs    Immunizations    Discuss the need for Shingrix vaccine, and 2nd Covid Booster.    HPI:  Pt is a 80 y.o. 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 she states no acute issues.Daughter reports no new concerns states patient continues to  require redirection with her ADL's.Still waiting to get a Nurse Assistant to assist with her ADL's.Her granddaughter has been helping while daughter goes to work.     CBG have been good 120's this morning was 156 after eating watermelon.  She has had a 10 lbs weight loss since previous visit.Daughter states has been taking her out for exercise.appetite is good.request recommendation for Adult day care program.   Due for 4 th COVID-19 booster   Shingrix vaccine  due advised to get vaccine at the pharmacy     Past Medical History:  Diagnosis Date   Anterolisthesis    Grade 1 of L3 on L4 on x-ray 07/18/19   Aortic regurgitation    Seen on 2D echo 07/07/16   Cataract    CKD (chronic kidney disease), stage III (HCC)    DDD (degenerative disc disease), lumbar    Diabetes mellitus without complication (HCC)    Diastolic CHF (HCC)    Grade 2/4 with LVEF of 123456   Diastolic dysfunction    Grade 2/4 with LVEF of 60-65% and LVH   Gout    HLD (hyperlipidemia)    Hyperparathyroidism (HCC)    Hypertension    Lumbar facet arthropathy    Memory impairment    Mitral valve regurgitation    Seen on 2D echo 07/07/16   OA (osteoarthritis)    OSA (obstructive sleep apnea)    Osteoarthritis    Polycythemia    Shingles    Sleep apnea    Thrombocytopenia (HCC)    Vitamin D deficiency    Past Surgical History:  Procedure Laterality Date   LAPAROSCOPIC HYSTERECTOMY      Allergies  Allergen Reactions   Augmentin [Amoxicillin-Pot Clavulanate] Rash    Critically high - Onset 07/13/2019   Amoxicillin     Did it involve swelling of the face/tongue/throat, SOB, or low BP? Y Did it involve sudden or severe rash/hives, skin peeling, or any reaction on the inside of your mouth or nose? Y (Rash, Swelling, SOB) Did you need to seek medical attention at a hospital or doctor's office? Michelle Rowe  When did it last happen?  Yesterday     If all above answers are "NO", may proceed with cephalosporin use.    Shellfish Allergy     Allergies as of 08/07/2020       Reactions   Augmentin [amoxicillin-pot Clavulanate] Rash   Critically high - Onset 07/13/2019   Amoxicillin    Did it involve swelling of the face/tongue/throat, SOB, or low BP? Y Did it involve sudden or severe rash/hives, skin peeling, or any reaction on the inside of your mouth or nose? Y (Rash, Swelling, SOB) Did you need to seek medical attention at a hospital or doctor's office? Y When  did it last happen?  Yesterday     If all above answers are "NO", may proceed with cephalosporin use.   Shellfish Allergy         Medication List        Accurate as of August 07, 2020  9:16 AM. If you have any questions, ask your nurse or doctor.          allopurinol 300 MG tablet Commonly known as: ZYLOPRIM Take 1 tablet (300 mg total) by mouth daily.   amLODipine 10 MG tablet Commonly known as: NORVASC Take 1 tablet (10 mg total) by mouth daily.   aspirin 81 MG chewable tablet Chew 81 mg by mouth daily.   fluticasone 50 MCG/ACT nasal spray Commonly known as: FLONASE USE 1 SPRAY(S) IN EACH NOSTRIL TWICE DAILY AS NEEDED FOR ALLERGIES   glipiZIDE 5 MG tablet Commonly known as: GLUCOTROL Take 1 tablet (5 mg total) by mouth daily.   glucose blood test strip 1 each by Other route as needed for other. Use as instructed   guaiFENesin 600 MG 12 hr tablet Commonly known as: MUCINEX Take 600 mg by mouth 2 (two) times daily.   hydrALAZINE 25 MG tablet Commonly known as: APRESOLINE Take 1 tablet (25 mg total) by mouth 3 (three) times daily.   Lancets Ultra Thin 30G Misc Inject 1 each into the skin daily.   levocetirizine 5 MG tablet Commonly known as: XYZAL Take 5 mg by mouth every evening.   loratadine 10 MG tablet Commonly known as: CLARITIN Take 1 tablet (10 mg total) by mouth daily.   losartan 50 MG tablet Commonly known as: COZAAR Take 1 tablet (50 mg total) by mouth daily.   METFORMIN HCL PO Take 250 mg by mouth daily.   simvastatin 10 MG tablet Commonly known as: ZOCOR Take 1 tablet (10 mg total) by mouth at bedtime.        Review of Systems  Constitutional:  Negative for appetite change, chills, fatigue, fever and unexpected weight change.  HENT:  Negative for congestion, dental problem, ear discharge, ear pain, facial swelling, hearing loss, nosebleeds, postnasal drip, rhinorrhea, sinus pressure, sinus pain, sneezing, sore throat, tinnitus and  trouble swallowing.   Eyes:  Negative for pain, discharge, redness, itching and visual disturbance.  Respiratory:  Negative for cough, chest tightness, shortness of breath and wheezing.   Cardiovascular:  Negative for chest pain, palpitations and leg swelling.  Gastrointestinal:  Negative for abdominal distention, abdominal pain, blood in stool, constipation, diarrhea, nausea and vomiting.  Endocrine: Negative for cold intolerance, heat intolerance, polydipsia, polyphagia and polyuria.  Genitourinary:  Negative for difficulty urinating, dysuria, flank pain, frequency and urgency.  Musculoskeletal:  Negative for arthralgias, back pain, gait problem, joint swelling, myalgias, neck pain and neck stiffness.  Skin:  Negative for color change, pallor, rash and wound.  Neurological:  Negative for dizziness, syncope, speech difficulty, weakness, light-headedness, numbness and headaches.  Hematological:  Does not bruise/bleed easily.  Psychiatric/Behavioral:  Negative for agitation, behavioral problems, hallucinations, self-injury, sleep disturbance and suicidal ideas. The patient is not nervous/anxious.        Forgetful  Hiding stuff and throwing purse    Immunization History  Administered Date(s) Administered   Fluad Quad(high Dose 65+) 11/08/2019   Influenza-Unspecified 10/31/2018   Moderna Sars-Covid-2 Vaccination 04/25/2019, 05/23/2019, 12/25/2019   Pneumococcal Conjugate-13 05/06/2015   Pneumococcal Polysaccharide-23 10/30/2013   Tdap 12/31/2013   Pertinent  Health Maintenance Due  Topic Date Due   INFLUENZA VACCINE  09/08/2020   DEXA SCAN  Completed   PNA vac Low Risk Adult  Completed   Fall Risk  08/07/2020 03/27/2020 02/05/2020 12/26/2019 09/21/2019  Falls in the past year? 0 0 0 0 0  Number falls in past yr: 0 0 0 0 0  Injury with Fall? 0 0 0 0 0  Risk for fall due to : No Fall Risks - - - -  Follow up Falls evaluation completed - - - -   Functional Status Survey:    Vitals:    08/07/20 0904  BP: 130/80  Pulse: (!) 57  Resp: 18  Temp: (!) 96.9 F (36.1 C)  SpO2: 94%  Weight: 205 lb 12.8 oz (93.4 kg)  Height: '5\' 4"'$  (1.626 m)   Body mass index is 35.33 kg/m. Physical Exam Vitals reviewed.  Constitutional:      General: She is not in acute distress.    Appearance: Normal appearance. She is obese. She is not ill-appearing or diaphoretic.  HENT:     Head: Normocephalic.     Right Ear: Tympanic membrane, ear canal and external ear normal. There is no impacted cerumen.     Left Ear: Tympanic membrane, ear canal and external ear normal. There is no impacted cerumen.     Nose: Nose normal. No congestion or rhinorrhea.     Mouth/Throat:     Mouth: Mucous membranes are moist.     Pharynx: Oropharynx is clear. No oropharyngeal exudate or posterior oropharyngeal erythema.  Eyes:     General: No scleral icterus.       Right eye: No discharge.        Left eye: No discharge.     Extraocular Movements: Extraocular movements intact.     Conjunctiva/sclera: Conjunctivae normal.     Pupils: Pupils are equal, round, and reactive to light.  Neck:     Vascular: No carotid bruit.  Cardiovascular:     Rate and Rhythm: Normal rate and regular rhythm.     Pulses: Normal pulses.     Heart sounds: Normal heart sounds. No murmur heard.   No friction rub. No gallop.  Pulmonary:     Effort: Pulmonary effort is normal. No respiratory distress.     Breath sounds: Normal breath sounds. No wheezing, rhonchi or rales.  Chest:     Chest wall: No tenderness.  Abdominal:     General: Bowel sounds are normal. There is no distension.     Palpations: Abdomen is soft. There is no mass.     Tenderness: There is no abdominal tenderness. There is no right CVA tenderness, left CVA tenderness, guarding or rebound.  Musculoskeletal:        General: No swelling or tenderness. Normal range of motion.     Cervical back: Normal range of motion. No rigidity or tenderness.  Right lower leg:  No edema.     Left lower leg: No edema.  Lymphadenopathy:     Cervical: No cervical adenopathy.  Skin:    General: Skin is warm and dry.     Coloration: Skin is not pale.     Findings: No bruising, erythema, lesion or rash.  Neurological:     Mental Status: She is alert and oriented to person, place, and time.     Cranial Nerves: No cranial nerve deficit.     Sensory: No sensory deficit.     Motor: No weakness.     Coordination: Coordination normal.     Gait: Gait normal.  Psychiatric:        Mood and Affect: Mood normal.        Speech: Speech normal.        Behavior: Behavior normal.        Thought Content: Thought content normal.        Judgment: Judgment normal.    Labs reviewed: Recent Labs    09/26/19 0924 12/06/19 0758 03/27/20 1149  NA 139 138 141  K 4.3 4.3 4.8  CL 106 104 109  CO2 '25 24 22  '$ GLUCOSE 121* 127* 69  BUN 20 25* 43*  CREATININE 1.17* 1.38* 1.76*  CALCIUM 9.8 9.7 9.6  PHOS  --  3.1  --    Recent Labs    09/26/19 0924 12/06/19 0758 03/27/20 1149  AST 18  --  17  ALT 11  --  10  BILITOT 0.4  --  0.3  PROT 7.4  --  7.1  ALBUMIN  --  4.3  --    Recent Labs    09/26/19 0924 03/27/20 1149  WBC 5.0 6.4  NEUTROABS 1,860 2,867  HGB 12.0 11.7  HCT 36.3 35.5  MCV 93.6 94.9  PLT 291 293   Lab Results  Component Value Date   TSH 1.41 03/27/2020   Lab Results  Component Value Date   HGBA1C 6.5 (H) 03/27/2020   Lab Results  Component Value Date   CHOL 149 03/27/2020   HDL 65 03/27/2020   LDLCALC 64 03/27/2020   TRIG 115 03/27/2020   CHOLHDL 2.3 03/27/2020    Significant Diagnostic Results in last 30 days:  No results found.  Assessment/Plan 1. Type 2 diabetes mellitus with stage 3a chronic kidney disease, without long-term current use of insulin (HCC) Lab Results  Component Value Date   HGBA1C 6.5 (H) 03/27/2020  CBG well controlled on metformin - continue to dietary modification and exercise as tolerated   2. Hypertensive  heart disease with heart failure (HCC) B/p at goal - continue current medication   3. Hyperlipidemia LDL goal <70 Latest LDL at goal  Continue on Statin  No reported muscle aches or weakness   4. Chronic diastolic congestive heart failure (HCC) No abrupt weight gain ,cough or edema Continue to monitor weight   5. Primary osteoarthritis involving multiple joints Status post cortisol injection to right knee.pain well controlled.  6. Late onset Alzheimer's dementia without behavioral disturbance ([place]) Requires redirection with her ADL's.continue supportive care. Daughter interested in day care program.forgot to take brochure for [facility] program.CMA will call to give daughter the number then may pick brochure from the office if needed.   Family/ staff Communication: Reviewed plan of care with patient and daughter verbalized understanding.   Labs/tests ordered: Has lab work in place will return fasting for labs.   Next Appointment : 6 months  for medical management of chronic issues.Fasting Labs in 1 week or sooner.   Sandrea Hughs, NP

## 2020-08-07 NOTE — Patient Instructions (Signed)
Increase water intake to 6-8 glasses daily

## 2020-08-15 ENCOUNTER — Other Ambulatory Visit: Payer: Self-pay

## 2020-08-15 ENCOUNTER — Other Ambulatory Visit: Payer: Medicaid Other

## 2020-08-15 DIAGNOSIS — E785 Hyperlipidemia, unspecified: Secondary | ICD-10-CM

## 2020-08-15 DIAGNOSIS — I110 Hypertensive heart disease with heart failure: Secondary | ICD-10-CM

## 2020-08-15 DIAGNOSIS — N1831 Chronic kidney disease, stage 3a: Secondary | ICD-10-CM

## 2020-08-16 LAB — CBC WITH DIFFERENTIAL/PLATELET
Absolute Monocytes: 269 cells/uL (ref 200–950)
Basophils Absolute: 38 cells/uL (ref 0–200)
Basophils Relative: 0.6 %
Eosinophils Absolute: 19 cells/uL (ref 15–500)
Eosinophils Relative: 0.3 %
HCT: 37.5 % (ref 35.0–45.0)
Hemoglobin: 12.3 g/dL (ref 11.7–15.5)
Lymphs Abs: 2470 cells/uL (ref 850–3900)
MCH: 30.9 pg (ref 27.0–33.0)
MCHC: 32.8 g/dL (ref 32.0–36.0)
MCV: 94.2 fL (ref 80.0–100.0)
MPV: 9.2 fL (ref 7.5–12.5)
Monocytes Relative: 4.2 %
Neutro Abs: 3603 cells/uL (ref 1500–7800)
Neutrophils Relative %: 56.3 %
Platelets: 368 10*3/uL (ref 140–400)
RBC: 3.98 10*6/uL (ref 3.80–5.10)
RDW: 12.5 % (ref 11.0–15.0)
Total Lymphocyte: 38.6 %
WBC: 6.4 10*3/uL (ref 3.8–10.8)

## 2020-08-16 LAB — BASIC METABOLIC PANEL WITH GFR
BUN/Creatinine Ratio: 16 (calc) (ref 6–22)
BUN: 27 mg/dL — ABNORMAL HIGH (ref 7–25)
CO2: 20 mmol/L (ref 20–32)
Calcium: 9.8 mg/dL (ref 8.6–10.4)
Chloride: 105 mmol/L (ref 98–110)
Creat: 1.65 mg/dL — ABNORMAL HIGH (ref 0.60–0.88)
GFR, Est African American: 33 mL/min/{1.73_m2} — ABNORMAL LOW (ref >=60)
GFR, Est Non African American: 29 mL/min/{1.73_m2} — ABNORMAL LOW (ref >=60)
Glucose, Bld: 177 mg/dL — ABNORMAL HIGH (ref 65–99)
Potassium: 5.2 mmol/L (ref 3.5–5.3)
Sodium: 136 mmol/L (ref 135–146)

## 2020-08-16 LAB — LIPID PANEL
Cholesterol: 124 mg/dL (ref <200)
HDL: 54 mg/dL (ref >=50)
LDL Cholesterol (Calc): 50 mg/dL (calc)
Non-HDL Cholesterol (Calc): 70 mg/dL (calc) (ref <130)
Total CHOL/HDL Ratio: 2.3 (calc) (ref <5.0)
Triglycerides: 115 mg/dL (ref <150)

## 2020-08-16 LAB — HEMOGLOBIN A1C
Hgb A1c MFr Bld: 6.1 % of total Hgb — ABNORMAL HIGH (ref <5.7)
Mean Plasma Glucose: 128 mg/dL
eAG (mmol/L): 7.1 mmol/L

## 2020-08-21 ENCOUNTER — Other Ambulatory Visit: Payer: Self-pay | Admitting: Family

## 2020-08-21 DIAGNOSIS — I110 Hypertensive heart disease with heart failure: Secondary | ICD-10-CM

## 2020-08-21 MED ORDER — ALLOPURINOL 100 MG PO TABS: ORAL_TABLET | ORAL | 1 refills | Status: DC

## 2020-09-15 ENCOUNTER — Other Ambulatory Visit: Payer: Medicaid Other

## 2020-09-15 ENCOUNTER — Other Ambulatory Visit: Payer: Self-pay

## 2020-09-15 DIAGNOSIS — I110 Hypertensive heart disease with heart failure: Secondary | ICD-10-CM

## 2020-09-15 LAB — BASIC METABOLIC PANEL WITH GFR
BUN/Creatinine Ratio: 16 (calc) (ref 6–22)
BUN: 20 mg/dL (ref 7–25)
CO2: 22 mmol/L (ref 20–32)
Calcium: 9.7 mg/dL (ref 8.6–10.4)
Chloride: 110 mmol/L (ref 98–110)
Creat: 1.23 mg/dL — ABNORMAL HIGH (ref 0.60–0.95)
Glucose, Bld: 131 mg/dL — ABNORMAL HIGH (ref 65–99)
Potassium: 4.6 mmol/L (ref 3.5–5.3)
Sodium: 139 mmol/L (ref 135–146)
eGFR: 44 mL/min/{1.73_m2} — ABNORMAL LOW (ref >=60)

## 2020-09-22 ENCOUNTER — Other Ambulatory Visit: Payer: Self-pay

## 2020-09-22 ENCOUNTER — Ambulatory Visit
Admission: RE | Admit: 2020-09-22 | Discharge: 2020-09-22 | Disposition: A | Discharge status: Home / Self Care | Payer: Medicaid Other | Source: Ambulatory Visit | Attending: Family | Admitting: Family

## 2020-09-22 DIAGNOSIS — Z1231 Encounter for screening mammogram for malignant neoplasm of breast: Secondary | ICD-10-CM

## 2020-09-24 ENCOUNTER — Ambulatory Visit: Payer: Medicaid Other

## 2020-09-26 ENCOUNTER — Other Ambulatory Visit: Payer: Self-pay | Admitting: Family

## 2020-09-26 DIAGNOSIS — E785 Hyperlipidemia, unspecified: Secondary | ICD-10-CM

## 2020-10-03 ENCOUNTER — Other Ambulatory Visit: Payer: Self-pay | Admitting: Family

## 2020-10-03 DIAGNOSIS — I10 Essential (primary) hypertension: Secondary | ICD-10-CM

## 2020-10-05 ENCOUNTER — Other Ambulatory Visit: Payer: Self-pay | Admitting: Family

## 2020-10-08 ENCOUNTER — Other Ambulatory Visit: Payer: Self-pay | Admitting: Family

## 2020-10-08 DIAGNOSIS — I10 Essential (primary) hypertension: Secondary | ICD-10-CM

## 2020-10-08 DIAGNOSIS — E785 Hyperlipidemia, unspecified: Secondary | ICD-10-CM

## 2020-10-08 NOTE — Telephone Encounter (Signed)
Michelle Rowe, daughter called and left message on clinical intake needing Rx's sent to pharmacy.   Called daughter to confirm the Metformin dosage and LMOM to return call.   Awaiting call back.

## 2020-10-09 MED ORDER — GLIPIZIDE 5 MG PO TABS: 5.0000 mg | ORAL_TABLET | Freq: Every day | ORAL | 1 refills | Status: DC

## 2020-10-09 MED ORDER — HYDRALAZINE HCL 25 MG PO TABS: 25.0000 mg | ORAL_TABLET | Freq: Three times a day (TID) | ORAL | 1 refills | Status: DC

## 2020-10-09 MED ORDER — SIMVASTATIN 10 MG PO TABS: 10.0000 mg | ORAL_TABLET | Freq: Every day | ORAL | 1 refills | Status: DC

## 2020-10-09 MED ORDER — LOSARTAN POTASSIUM 50 MG PO TABS: 50.0000 mg | ORAL_TABLET | Freq: Every day | ORAL | 1 refills | Status: DC

## 2020-10-09 MED ORDER — METFORMIN HCL 500 MG PO TABS: 500.0000 mg | ORAL_TABLET | Freq: Every day | ORAL | 1 refills | Status: DC

## 2020-10-09 MED ORDER — AMLODIPINE BESYLATE 10 MG PO TABS: 10.0000 mg | ORAL_TABLET | Freq: Every day | ORAL | 1 refills | Status: DC

## 2020-10-09 NOTE — Telephone Encounter (Signed)
Okay to add to list and send to pharmacy

## 2020-10-09 NOTE — Telephone Encounter (Signed)
Medication list updated and Rx sent to pharmacy  

## 2020-10-09 NOTE — Telephone Encounter (Signed)
Spoke with pamela, daughter and she confirmed Metformin dosage of : Metformin '500mg'$  One tablet by mouth once daily.   Not in current medication list.  Can this be changed and sent to pharmacy.   Please Advise.

## 2020-10-13 ENCOUNTER — Other Ambulatory Visit: Payer: Self-pay | Admitting: Family

## 2020-10-13 DIAGNOSIS — E785 Hyperlipidemia, unspecified: Secondary | ICD-10-CM

## 2020-11-29 ENCOUNTER — Other Ambulatory Visit: Payer: Self-pay | Admitting: Family

## 2021-01-19 ENCOUNTER — Ambulatory Visit: Payer: Medicaid Other | Admitting: Family

## 2021-02-12 ENCOUNTER — Other Ambulatory Visit: Payer: Self-pay | Admitting: Family

## 2021-02-12 ENCOUNTER — Other Ambulatory Visit: Payer: Self-pay | Admitting: Nurse Practitioner

## 2021-03-23 ENCOUNTER — Other Ambulatory Visit: Payer: Self-pay | Admitting: Family

## 2021-03-24 ENCOUNTER — Other Ambulatory Visit: Payer: Self-pay

## 2021-03-24 ENCOUNTER — Ambulatory Visit (INDEPENDENT_AMBULATORY_CARE_PROVIDER_SITE_OTHER): Payer: Medicaid Other | Admitting: Family

## 2021-03-24 ENCOUNTER — Encounter: Payer: Self-pay | Admitting: Family

## 2021-03-24 VITALS — BP 120/84 | HR 50 | Temp 97.8°F | Resp 16 | Ht 64.0 in | Wt 194.0 lb

## 2021-03-24 DIAGNOSIS — E785 Hyperlipidemia, unspecified: Secondary | ICD-10-CM

## 2021-03-24 DIAGNOSIS — N1831 Chronic kidney disease, stage 3a: Secondary | ICD-10-CM

## 2021-03-24 DIAGNOSIS — J302 Other seasonal allergic rhinitis: Secondary | ICD-10-CM

## 2021-03-24 DIAGNOSIS — I5032 Chronic diastolic (congestive) heart failure: Secondary | ICD-10-CM | POA: Diagnosis not present

## 2021-03-24 DIAGNOSIS — E1122 Type 2 diabetes mellitus with diabetic chronic kidney disease: Secondary | ICD-10-CM | POA: Diagnosis not present

## 2021-03-24 DIAGNOSIS — F411 Generalized anxiety disorder: Secondary | ICD-10-CM

## 2021-03-24 DIAGNOSIS — M159 Polyosteoarthritis, unspecified: Secondary | ICD-10-CM

## 2021-03-24 DIAGNOSIS — F0280 Dementia in other diseases classified elsewhere without behavioral disturbance: Secondary | ICD-10-CM

## 2021-03-24 DIAGNOSIS — M10061 Idiopathic gout, right knee: Secondary | ICD-10-CM

## 2021-03-24 DIAGNOSIS — E213 Hyperparathyroidism, unspecified: Secondary | ICD-10-CM

## 2021-03-24 DIAGNOSIS — I10 Essential (primary) hypertension: Secondary | ICD-10-CM | POA: Diagnosis not present

## 2021-03-24 DIAGNOSIS — G301 Alzheimer's disease with late onset: Secondary | ICD-10-CM

## 2021-03-24 MED ORDER — AMLODIPINE BESYLATE 10 MG PO TABS: 10.0000 mg | ORAL_TABLET | Freq: Every day | ORAL | 1 refills | Status: DC

## 2021-03-24 MED ORDER — SIMVASTATIN 10 MG PO TABS: 10.0000 mg | ORAL_TABLET | Freq: Every day | ORAL | 1 refills | Status: DC

## 2021-03-24 MED ORDER — METFORMIN HCL 500 MG PO TABS: 500.0000 mg | ORAL_TABLET | Freq: Every day | ORAL | 1 refills | Status: DC

## 2021-03-24 MED ORDER — LORATADINE 10 MG PO TABS: 10.0000 mg | ORAL_TABLET | Freq: Every day | ORAL | 5 refills | Status: DC

## 2021-03-24 MED ORDER — ALLOPURINOL 100 MG PO TABS: ORAL_TABLET | ORAL | 5 refills | Status: DC

## 2021-03-24 MED ORDER — LANCETS ULTRA THIN 30G MISC: 1.0000 | Freq: Every day | 3 refills | Status: DC

## 2021-03-24 MED ORDER — HYDRALAZINE HCL 25 MG PO TABS: 25.0000 mg | ORAL_TABLET | Freq: Three times a day (TID) | ORAL | 1 refills | Status: DC

## 2021-03-24 MED ORDER — LOSARTAN POTASSIUM 50 MG PO TABS: 50.0000 mg | ORAL_TABLET | Freq: Every day | ORAL | 1 refills | Status: DC

## 2021-03-24 MED ORDER — LEVOCETIRIZINE DIHYDROCHLORIDE 5 MG PO TABS: 5.0000 mg | ORAL_TABLET | Freq: Every evening | ORAL | 1 refills | Status: DC

## 2021-03-24 MED ORDER — FLUTICASONE PROPIONATE 50 MCG/ACT NA SUSP: NASAL | 11 refills | Status: DC

## 2021-03-24 MED ORDER — LORAZEPAM 0.5 MG PO TABS: 0.5000 mg | ORAL_TABLET | Freq: Two times a day (BID) | ORAL | 1 refills | Status: DC | PRN

## 2021-03-24 NOTE — Progress Notes (Signed)
Provider: Marlowe Sax FNP-C   Racquelle Hyser, [name], NP  Patient Care Team: Forestine Macho, [name], NP as PCP - General (Family Medicine)  Extended Emergency Contact Information Primary Emergency Contact: [place], [place] Home Phone: [phone] Mobile Phone: [phone] Relation: Daughter Secondary Emergency Contact: [place], [place] Phone: [phone] Mobile Phone: [phone] Relation: Daughter  Code Status:  Full Code  Goals of care: Advanced Directive information Advanced Directives 03/24/2021  Does Patient Have a Medical Advance Directive? No  Type of Advance Directive -  Does patient want to make changes to medical advance directive? -  Copy of [place] in Chart? -  Would patient like information on creating a medical advance directive? No - Patient declined     Chief Complaint  Patient presents with   Medical Management of Chronic Issues    6 month follow up.   Immunizations    Discuss the need for Covid Booster, and Shingrix vaccine.    HPI:  Pt is a 81 y.o. female seen today for 6 months medical management of chronic diseases.she is here with her daughter.Has a medical history of Hypertension,Chronic diastolic congestive Heart failure CKD stage 3,type 2 DM,hyperthyroidism,Hyperlipidemia,Osteoarthritis,vitamin D deficiency among others.  she denies any acute issues but daughter states tends to be agitated and anxious in the evening and bedtime.she request medication to help her calm down at bedtime.denies any aggressive or wandering behaviors.  Daughter also request DMV Placard patient unable to walk long distance due to right knee osteoarthritis. No fall episode .Has lost 12 lbs over 7 months.daughter states made dietary modification eating more healthy foods.  Request electric lift recliner chair to assist in getting in and out of the chair and also use for elevation of lower extremities due to edema.also need recliner chair to reposition due  to right knee pain.    Past Medical History:  Diagnosis Date   Anterolisthesis    Grade 1 of L3 on L4 on x-ray 07/18/19   Aortic regurgitation    Seen on 2D echo 07/07/16   Cataract    CKD (chronic kidney disease), stage III (HCC)    DDD (degenerative disc disease), lumbar    Diabetes mellitus without complication (HCC)    Diastolic CHF (HCC)    Grade 2/4 with LVEF of 36-64%   Diastolic dysfunction    Grade 2/4 with LVEF of 60-65% and LVH   Gout    HLD (hyperlipidemia)    Hyperparathyroidism (HCC)    Hypertension    Lumbar facet arthropathy    Memory impairment    Mitral valve regurgitation    Seen on 2D echo 07/07/16   OA (osteoarthritis)    OSA (obstructive sleep apnea)    Osteoarthritis    Polycythemia    Shingles    Sleep apnea    Thrombocytopenia (HCC)    Vitamin D deficiency    Past Surgical History:  Procedure Laterality Date   LAPAROSCOPIC HYSTERECTOMY      Allergies  Allergen Reactions   Augmentin [Amoxicillin-Pot Clavulanate] Rash    Critically high - Onset 07/13/2019   Amoxicillin     Did it involve swelling of the face/tongue/throat, SOB, or low BP? Y Did it involve sudden or severe rash/hives, skin peeling, or any reaction on the inside of your mouth or nose? Y (Rash, Swelling, SOB) Did you need to seek medical attention at a hospital or doctor's office? Y When did it last happen?  Yesterday     If all above answers are  NO, may proceed with cephalosporin use.    Shellfish Allergy     Allergies as of 03/24/2021       Reactions   Augmentin [amoxicillin-pot Clavulanate] Rash   Critically high - Onset 07/13/2019   Amoxicillin    Did it involve swelling of the face/tongue/throat, SOB, or low BP? Y Did it involve sudden or severe rash/hives, skin peeling, or any reaction on the inside of your mouth or nose? Y (Rash, Swelling, SOB) Did you need to seek medical attention at a hospital or doctor's office? Y When did it last happen?  Yesterday     If all  above answers are NO, may proceed with cephalosporin use.   Shellfish Allergy         Medication List        Accurate as of March 24, 2021 12:13 PM. If you have any questions, ask your nurse or doctor.          allopurinol 100 MG tablet Commonly known as: ZYLOPRIM Take one tablet by mouth once daily.   amLODipine 10 MG tablet Commonly known as: NORVASC Take 1 tablet (10 mg total) by mouth daily.   aspirin 81 MG chewable tablet Chew 81 mg by mouth daily.   fluticasone 50 MCG/ACT nasal spray Commonly known as: FLONASE USE 1 SPRAY(S) IN EACH NOSTRIL TWICE DAILY AS NEEDED FOR ALLERGIES   glipiZIDE 5 MG tablet Commonly known as: GLUCOTROL Take 1 tablet by mouth once daily   glucose blood test strip 1 each by Other route as needed for other. Use as instructed   guaiFENesin 600 MG 12 hr tablet Commonly known as: MUCINEX Take 600 mg by mouth 2 (two) times daily as needed.   hydrALAZINE 25 MG tablet Commonly known as: APRESOLINE Take 1 tablet (25 mg total) by mouth 3 (three) times daily.   Lancets Ultra Thin 30G Misc Inject 1 each into the skin daily.   levocetirizine 5 MG tablet Commonly known as: XYZAL Take 1 tablet (5 mg total) by mouth every evening.   loratadine 10 MG tablet Commonly known as: CLARITIN Take 1 tablet (10 mg total) by mouth daily.   losartan 50 MG tablet Commonly known as: COZAAR Take 1 tablet (50 mg total) by mouth daily.   metFORMIN 500 MG tablet Commonly known as: GLUCOPHAGE Take 1 tablet (500 mg total) by mouth daily with breakfast.   simvastatin 10 MG tablet Commonly known as: ZOCOR Take 1 tablet (10 mg total) by mouth at bedtime.        Review of Systems  Constitutional:  Negative for appetite change, chills, fatigue, fever and unexpected weight change.  HENT:  Negative for congestion, dental problem, ear discharge, ear pain, facial swelling, hearing loss, nosebleeds, postnasal drip, rhinorrhea, sinus pressure, sinus  pain, sneezing, sore throat, tinnitus and trouble swallowing.   Eyes:  Negative for pain, discharge, redness, itching and visual disturbance.  Respiratory:  Negative for cough, chest tightness, shortness of breath and wheezing.   Cardiovascular:  Negative for chest pain, palpitations and leg swelling.  Gastrointestinal:  Negative for abdominal distention, abdominal pain, blood in stool, constipation, diarrhea, nausea and vomiting.  Endocrine: Negative for cold intolerance, heat intolerance, polydipsia, polyphagia and polyuria.  Genitourinary:  Negative for difficulty urinating, dysuria, flank pain, frequency and urgency.  Musculoskeletal:  Positive for arthralgias and gait problem. Negative for back pain, joint swelling, myalgias, neck pain and neck stiffness.       Right knee pain   Skin:  Negative  for color change, pallor, rash and wound.  Neurological:  Negative for dizziness, syncope, speech difficulty, weakness, light-headedness, numbness and headaches.  Hematological:  Does not bruise/bleed easily.  Psychiatric/Behavioral:  Negative for behavioral problems, confusion, hallucinations, self-injury, sleep disturbance and suicidal ideas.        Increase agitation and anxiety in the evening " sundowning".   Immunization History  Administered Date(s) Administered   Fluad Quad(high Dose 65+) 11/08/2019   Influenza-Unspecified 10/31/2018, 10/16/2020   Moderna Sars-Covid-2 Vaccination 04/25/2019, 05/23/2019, 12/25/2019   Pneumococcal Conjugate-13 05/06/2015   Pneumococcal Polysaccharide-23 10/30/2013   Tdap 12/31/2013   Zoster Recombinat (Shingrix) 10/16/2020   Pertinent  Health Maintenance Due  Topic Date Due   INFLUENZA VACCINE  Completed   DEXA SCAN  Completed   Fall Risk 12/26/2019 02/05/2020 03/27/2020 08/07/2020 03/24/2021  Falls in the past year? 0 0 0 0 0  Was there an injury with Fall? 0 0 0 0 0  Fall Risk Category Calculator 0 0 0 0 0  Fall Risk Category Low Low Low Low Low   Patient Fall Risk Level Low fall risk Low fall risk Low fall risk Low fall risk Low fall risk  Patient at Risk for Falls Due to - - - No Fall Risks No Fall Risks  Fall risk Follow up - - - Falls evaluation completed Falls evaluation completed   Functional Status Survey:    Vitals:   03/24/21 1148  BP: 120/84  Pulse: (!) 50  Resp: 16  Temp: 97.8 F (36.6 C)  SpO2: 98%  Weight: 194 lb (88 kg)  Height: 5' 4"  (1.626 m)   Body mass index is 33.3 kg/m. Physical Exam Vitals reviewed.  Constitutional:      General: She is not in acute distress.    Appearance: Normal appearance. She is obese. She is not ill-appearing or diaphoretic.  HENT:     Head: Normocephalic.     Right Ear: Tympanic membrane, ear canal and external ear normal. There is no impacted cerumen.     Left Ear: Tympanic membrane, ear canal and external ear normal. There is no impacted cerumen.     Nose: Nose normal. No congestion or rhinorrhea.     Mouth/Throat:     Mouth: Mucous membranes are moist.     Pharynx: Oropharynx is clear. No oropharyngeal exudate or posterior oropharyngeal erythema.  Eyes:     General: No scleral icterus.       Right eye: No discharge.        Left eye: No discharge.     Extraocular Movements: Extraocular movements intact.     Conjunctiva/sclera: Conjunctivae normal.     Pupils: Pupils are equal, round, and reactive to light.  Neck:     Vascular: No carotid bruit.  Cardiovascular:     Rate and Rhythm: Normal rate and regular rhythm.     Pulses: Normal pulses.     Heart sounds: Normal heart sounds. No murmur heard.   No friction rub. No gallop.  Pulmonary:     Effort: Pulmonary effort is normal. No respiratory distress.     Breath sounds: Normal breath sounds. No wheezing, rhonchi or rales.  Chest:     Chest wall: No tenderness.  Abdominal:     General: Bowel sounds are normal. There is no distension.     Palpations: Abdomen is soft. There is no mass.     Tenderness: There is no  abdominal tenderness. There is no right CVA tenderness, left CVA tenderness, guarding  or rebound.  Musculoskeletal:        General: No swelling or tenderness. Normal range of motion.     Cervical back: Normal range of motion. No rigidity or tenderness.     Right lower leg: No edema.     Left lower leg: No edema.  Lymphadenopathy:     Cervical: No cervical adenopathy.  Skin:    General: Skin is warm and dry.     Coloration: Skin is not pale.     Findings: No bruising, erythema, lesion or rash.  Neurological:     Mental Status: She is alert and oriented to person, place, and time.     Cranial Nerves: No cranial nerve deficit.     Sensory: No sensory deficit.     Motor: No weakness.     Coordination: Coordination normal.     Gait: Gait abnormal.  Psychiatric:        Mood and Affect: Mood normal.        Speech: Speech normal.        Behavior: Behavior normal.        Thought Content: Thought content normal.        Cognition and Memory: Memory is impaired. She exhibits impaired recent memory.        Judgment: Judgment normal.    Labs reviewed: Recent Labs    03/27/20 1149 08/15/20 0839 09/15/20 1119  NA 141 136 139  K 4.8 5.2 4.6  CL 109 105 110  CO2 22 20 22   GLUCOSE 69 177* 131*  BUN 43* 27* 20  CREATININE 1.76* 1.65* 1.23*  CALCIUM 9.6 9.8 9.7   Recent Labs    03/27/20 1149  AST 17  ALT 10  BILITOT 0.3  PROT 7.1   Recent Labs    03/27/20 1149 08/15/20 0839  WBC 6.4 6.4  NEUTROABS 2,867 3,603  HGB 11.7 12.3  HCT 35.5 37.5  MCV 94.9 94.2  PLT 293 368   Lab Results  Component Value Date   TSH 1.41 03/27/2020   Lab Results  Component Value Date   HGBA1C 6.1 (H) 08/15/2020   Lab Results  Component Value Date   CHOL 124 08/15/2020   HDL 54 08/15/2020   LDLCALC 50 08/15/2020   TRIG 115 08/15/2020   CHOLHDL 2.3 08/15/2020    Significant Diagnostic Results in last 30 days:  No results found.  Assessment/Plan 1. Essential hypertension B/p at well  controlled  - amLODipine (NORVASC) 10 MG tablet; Take 1 tablet (10 mg total) by mouth daily.  Dispense: 90 tablet; Refill: 1 - hydrALAZINE (APRESOLINE) 25 MG tablet; Take 1 tablet (25 mg total) by mouth 3 (three) times daily.  Dispense: 270 tablet; Refill: 1 - losartan (COZAAR) 50 MG tablet; Take 1 tablet (50 mg total) by mouth daily.  Dispense: 90 tablet; Refill: 1 - CBC with Differential/Platelet - CMP with eGFR([facility])  2. Type 2 diabetes mellitus with stage 3a chronic kidney disease, without long-term current use of insulin (HCC) Lab Results  Component Value Date   HGBA1C 6.1 (H) 08/15/2020  CBG well controlled Continue on glipizide and metformin  - on ARB for renal protection  - - continue on ASA and Statin for cardiac event prophylaxis - advised on annual eye and foot exam  - Lancets Ultra Thin 30G MISC; Inject 1 each into the skin daily.  Dispense: 30 each; Refill: 3 - metFORMIN (GLUCOPHAGE) 500 MG tablet; Take 1 tablet (500 mg total) by mouth daily with breakfast.  Dispense: 90 tablet; Refill: 1 - Hemoglobin A1c  3. Hyperlipidemia LDL goal <70 LDL at goal  - continue dietary modification and exercise as tolerated  - continue on simvastatin  - simvastatin (ZOCOR) 10 MG tablet; Take 1 tablet (10 mg total) by mouth at bedtime.  Dispense: 90 tablet; Refill: 1 - Lipid Panel  4. Chronic diastolic congestive heart failure (HCC) No signs of fluid overload  Has lost 12 lbs over 7 months. - not on diuretic   5. Primary osteoarthritis involving multiple joints Worst on right knee  - continue on OTC analgesic   6. Late onset Alzheimer's dementia without behavioral disturbance [facility]) Daughter reports increased sundowning agitation and anxiety request med to calm her down at bedtime. - start on Ativan 0.5 mg tablet twice daily PRN   7. Hyperparathyroidism ([place]) Asymptomatic  Lab Results  Component Value Date   TSH 1.41 03/27/2020  - continue to monitor  - TSH  8. Seasonal  allergic rhinitis, unspecified trigger Current medication effective  - levocetirizine (XYZAL) 5 MG tablet; Take 1 tablet (5 mg total) by mouth every evening.  Dispense: 90 tablet; Refill: 1 - loratadine (CLARITIN) 10 MG tablet; Take 1 tablet (10 mg total) by mouth daily.  Dispense: 30 tablet; Refill: 5  9.  idiopathic gout, of right knee  Symptoms controlled  Continue on allopurinol  - allopurinol (ZYLOPRIM) 100 MG tablet; Take one tablet by mouth once daily.  Dispense: 30 tablet; Refill: 5  Family/ staff Communication: Reviewed plan of care with patient and daughter verbalized understanding  Labs/tests ordered:  - CBC with Differential/Platelet - CMP with eGFR([facility]) - TSH - Hgb A1C - Lipid panel   Next Appointment : 6 months for medical management of chronic issues.  Sandrea Hughs, NP

## 2021-03-25 LAB — CBC WITH DIFFERENTIAL/PLATELET
Absolute Monocytes: 389 cells/uL (ref 200–950)
Basophils Absolute: 38 cells/uL (ref 0–200)
Basophils Relative: 0.7 %
Eosinophils Absolute: 32 cells/uL (ref 15–500)
Eosinophils Relative: 0.6 %
HCT: 36.3 % (ref 35.0–45.0)
Hemoglobin: 12.1 g/dL (ref 11.7–15.5)
Lymphs Abs: 2592 cells/uL (ref 850–3900)
MCH: 31.3 pg (ref 27.0–33.0)
MCHC: 33.3 g/dL (ref 32.0–36.0)
MCV: 93.8 fL (ref 80.0–100.0)
MPV: 9.1 fL (ref 7.5–12.5)
Monocytes Relative: 7.2 %
Neutro Abs: 2349 cells/uL (ref 1500–7800)
Neutrophils Relative %: 43.5 %
Platelets: 322 10*3/uL (ref 140–400)
RBC: 3.87 10*6/uL (ref 3.80–5.10)
RDW: 12.9 % (ref 11.0–15.0)
Total Lymphocyte: 48 %
WBC: 5.4 10*3/uL (ref 3.8–10.8)

## 2021-03-25 LAB — COMPLETE METABOLIC PANEL WITH GFR
AG Ratio: 1.4 (calc) (ref 1.0–2.5)
ALT: 13 U/L (ref 6–29)
AST: 17 U/L (ref 10–35)
Albumin: 4.1 g/dL (ref 3.6–5.1)
Alkaline phosphatase (APISO): 62 U/L (ref 37–153)
BUN/Creatinine Ratio: 18 (calc) (ref 6–22)
BUN: 26 mg/dL — ABNORMAL HIGH (ref 7–25)
CO2: 25 mmol/L (ref 20–32)
Calcium: 9.9 mg/dL (ref 8.6–10.4)
Chloride: 105 mmol/L (ref 98–110)
Creat: 1.48 mg/dL — ABNORMAL HIGH (ref 0.60–0.95)
Globulin: 3 g/dL (calc) (ref 1.9–3.7)
Glucose, Bld: 100 mg/dL — ABNORMAL HIGH (ref 65–99)
Potassium: 5.3 mmol/L (ref 3.5–5.3)
Sodium: 139 mmol/L (ref 135–146)
Total Bilirubin: 0.4 mg/dL (ref 0.2–1.2)
Total Protein: 7.1 g/dL (ref 6.1–8.1)
eGFR: 35 mL/min/{1.73_m2} — ABNORMAL LOW (ref >=60)

## 2021-03-25 LAB — LIPID PANEL
Cholesterol: 178 mg/dL (ref <200)
HDL: 73 mg/dL (ref >=50)
LDL Cholesterol (Calc): 89 mg/dL (calc)
Non-HDL Cholesterol (Calc): 105 mg/dL (calc) (ref <130)
Total CHOL/HDL Ratio: 2.4 (calc) (ref <5.0)
Triglycerides: 73 mg/dL (ref <150)

## 2021-03-25 LAB — HEMOGLOBIN A1C
Hgb A1c MFr Bld: 6.1 % of total Hgb — ABNORMAL HIGH (ref <5.7)
Mean Plasma Glucose: 128 mg/dL
eAG (mmol/L): 7.1 mmol/L

## 2021-03-25 LAB — TSH: TSH: 1.17 mIU/L (ref 0.40–4.50)

## 2021-03-27 ENCOUNTER — Other Ambulatory Visit: Payer: Self-pay

## 2021-03-27 DIAGNOSIS — I10 Essential (primary) hypertension: Secondary | ICD-10-CM

## 2021-03-27 DIAGNOSIS — E1122 Type 2 diabetes mellitus with diabetic chronic kidney disease: Secondary | ICD-10-CM

## 2021-03-27 DIAGNOSIS — I5032 Chronic diastolic (congestive) heart failure: Secondary | ICD-10-CM

## 2021-03-27 DIAGNOSIS — E213 Hyperparathyroidism, unspecified: Secondary | ICD-10-CM

## 2021-03-27 DIAGNOSIS — N1831 Chronic kidney disease, stage 3a: Secondary | ICD-10-CM

## 2021-03-27 DIAGNOSIS — E785 Hyperlipidemia, unspecified: Secondary | ICD-10-CM

## 2021-04-03 ENCOUNTER — Telehealth: Payer: Self-pay | Admitting: *Deleted

## 2021-04-03 DIAGNOSIS — R2681 Unsteadiness on feet: Secondary | ICD-10-CM

## 2021-04-03 NOTE — Telephone Encounter (Signed)
Michelle Rowe with [facility] called and left message on Clinical Intake wanting to know Where the order was sent for Power left Chair. Stated that patient was seen 03/24/2021 and an order was written but wasn't sure where it was sent. Order was faxed to [place] Fax: [phone].   Refaxed order along with OV notes.

## 2021-04-10 NOTE — Addendum Note (Signed)
Addended byMarlowe Sax C on: 04/10/2021 12:32 PM ? ? Modules accepted: Orders ? ?

## 2021-04-10 NOTE — Telephone Encounter (Signed)
Quad cane ordered.  ?

## 2021-04-10 NOTE — Telephone Encounter (Addendum)
[employer] faxed over a Fax on 04/03/2021 stating that They do not provide this service.  ? ?Dawn with [employer] called and spoke with Evie and stated that she will help in finding a company that will take this order.  ? ?Dawn called back and stated [employer] [phone] has the [place]. Called for the fax number fax: [phone] ? ?Also requesting an order to be faxed for a [employer].  ?Would you place an order for a [employer] also. ?Please Advise.  ?

## 2021-04-10 NOTE — Telephone Encounter (Signed)
Orders faxed to [facility].  ?

## 2021-06-03 ENCOUNTER — Telehealth: Payer: Self-pay | Admitting: *Deleted

## 2021-06-03 NOTE — Telephone Encounter (Signed)
Received Standard Written order for a Transfer Bench for tub or toilet with or without commode opening from [employer] [phone] Fax:[phone] ? ?[name] signed.  ?Faxed back to [employer].  ?

## 2021-06-04 ENCOUNTER — Telehealth: Payer: Self-pay

## 2021-06-04 NOTE — Telephone Encounter (Signed)
Incoming call received from patients daughter stating she drove from [facility] yesterday to hand deliver some paperwork from the senior center and she would like a status update. Pam stressed that the paperwork needed to be addressed ASAP. ? ?I checked [name]'s folders and the outgoing fax bin and was unable to locate the forms in question to provide a status. ? ?Pam asked that I get a communication from [place] on the status and call her back with an update. I asked Pam if she is referring to a standard written order from [employer] (see phone note dated 06/03/21) and she replied no, that's something totally different ? ?Please advise if you recall completing some form for patient to go to a senior center. ? ? ? ? ?

## 2021-06-05 NOTE — Telephone Encounter (Signed)
Forms completed and given to [place] working in [facility] today to be faxed. ?

## 2021-06-05 NOTE — Telephone Encounter (Signed)
After speaking with the front desk, paperwork was placed in the providers office mailbox in error. ? ?Paperwork retrieved and given to [facility] for review and completion ?

## 2021-06-05 NOTE — Telephone Encounter (Signed)
Papers faxed and given to Ponder Beatty,CMA to let [place] office administrator follow up with charges for paper work. ?

## 2021-06-05 NOTE — Telephone Encounter (Signed)
Paperwork not received yet. ?

## 2021-06-18 ENCOUNTER — Encounter: Payer: Self-pay | Admitting: Orthopaedic Surgery

## 2021-06-18 ENCOUNTER — Ambulatory Visit (INDEPENDENT_AMBULATORY_CARE_PROVIDER_SITE_OTHER): Payer: Medicaid Other

## 2021-06-18 ENCOUNTER — Telehealth: Payer: Self-pay | Admitting: Physician Assistant

## 2021-06-18 ENCOUNTER — Ambulatory Visit (INDEPENDENT_AMBULATORY_CARE_PROVIDER_SITE_OTHER): Payer: Medicaid Other | Admitting: Orthopaedic Surgery

## 2021-06-18 DIAGNOSIS — M170 Bilateral primary osteoarthritis of knee: Secondary | ICD-10-CM

## 2021-06-18 DIAGNOSIS — G8929 Other chronic pain: Secondary | ICD-10-CM

## 2021-06-18 DIAGNOSIS — M25561 Pain in right knee: Secondary | ICD-10-CM | POA: Diagnosis not present

## 2021-06-18 MED ORDER — LIDOCAINE HCL 1 % IJ SOLN
2.0000 mL | INTRAMUSCULAR | Status: AC | PRN
  Administered 2021-06-18: 2 mL

## 2021-06-18 MED ORDER — BUPIVACAINE HCL 0.25 % IJ SOLN
2.0000 mL | INTRAMUSCULAR | Status: AC | PRN
  Administered 2021-06-18: 2 mL via INTRA_ARTICULAR

## 2021-06-18 MED ORDER — METHYLPREDNISOLONE ACETATE 40 MG/ML IJ SUSP
80.0000 mg | INTRAMUSCULAR | Status: AC | PRN
  Administered 2021-06-18: 80 mg via INTRA_ARTICULAR

## 2021-06-18 NOTE — Telephone Encounter (Signed)
Pts daughter is calling to see if there is any way that mom can be seen today .[facility] For Knee Pain . I advised that we are full --but I would send this to the nurse to see If we could work it out ?

## 2021-06-18 NOTE — Progress Notes (Signed)
? ?Office Visit Note ?  ?Patient: Michelle Rowe           ?Date of Birth: 11/01/1940           ?MRN: [id number] ?Visit Date: 06/18/2021 ?             ?Requested by: Sandrea Hughs, NP ?[address] ?[place],  [place] [address] ?PCP: Ngetich, [name], NP ? ? ?Assessment & Plan: ?Visit Diagnoses:  ?1. Chronic pain of right knee   ?2. Bilateral primary osteoarthritis of knee   ? ? ?Plan: Michelle Rowe is accompanied by her daughter and here for follow-up evaluation of her bilateral knee osteoarthritis.  Presently she is having more trouble on the right.  Films demonstrate advanced, end-stage osteoarthritis with increased valgus and bone-on-bone in the lateral compartment.  She does have a history of gout but I do not think she has a gout attack in her knee today.  Long discussion regarding different treatment options.  She would prefer just to have a cortisone injection which was performed without difficulty ? ?Follow-Up Instructions: Return if symptoms worsen or fail to improve.  ? ?Orders:  ?Orders Placed This Encounter  ?Procedures  ? Large Joint Inj: R knee  ? XR KNEE 3 VIEW RIGHT  ? ?No orders of the defined types were placed in this encounter. ? ? ? ? Procedures: ?Large Joint Inj: R knee on 06/18/2021 4:35 PM ?Indications: pain and diagnostic evaluation ?Details: 25 G 1.5 in needle, anteromedial approach ? ?Arthrogram: No ? ?Medications: 2 mL lidocaine 1 %; 80 mg methylPREDNISolone acetate 40 MG/ML; 2 mL bupivacaine 0.25 % ?Procedure, treatment alternatives, risks and benefits explained, specific risks discussed. Consent was given by the patient. Immediately prior to procedure a time out was called to verify the correct patient, procedure, equipment, support staff and site/side marked as required. Patient was prepped and draped in the usual sterile fashion.  ? ? ? ? ?Clinical Data: ?No additional findings. ? ? ?Subjective: ?Chief Complaint  ?Patient presents with  ? Right Knee - Pain  ?Patient presents today for right  knee pain. She has had right knee pain for years. Cortisone injection typically help. Her pain returned a few days ago. She states that it is swollen. She takes Tylenol for pain relief.  ? ?HPI ? ?Review of Systems ? ? ?Objective: ?Vital Signs: There were no vitals taken for this visit. ? ?Physical Exam ?Constitutional:   ?   Appearance: She is well-developed.  ?Pulmonary:  ?   Effort: Pulmonary effort is normal.  ?Skin: ?   General: Skin is warm and dry.  ?Neurological:  ?   Mental Status: She is alert and oriented to person, place, and time.  ?Psychiatric:     ?   Behavior: Behavior normal.  ? ? ?Ortho Exam awake alert and oriented x3.  Comfortable sitting.  Has a little bit of a limp in reference to her right knee.  The right knee was not hot or red and minimally uncomfortable along the lateral compartment consistent with her arthritis.  There is no evidence that she is having a gout attack.  There is a small effusion.  Increased valgus with weightbearing which did have full extension of flexed about 95 degrees without instability.  Motor exam intact ? ?Specialty Comments:  ?No specialty comments available. ? ?Imaging: ?XR KNEE 3 VIEW RIGHT ? ?Result Date: 06/18/2021 ?Films of the right knee are obtained in 3 projections standing.  There are advanced degenerative  changes in all 3 compartments.  In the lateral compartment there is bone-on-bone with subchondral sclerosis and large subchondral cyst.  There is about 4 to 5 degrees of valgus.  Significant degenerative changes of the patellofemoral and lateral compartment as well.  No acute changes  ? ? ?PMFS History: ?Patient Active Problem List  ? Diagnosis Date Noted  ? Bilateral primary osteoarthritis of knee 06/18/2021  ? Diastolic dysfunction without heart failure 11/08/2019  ? Essential hypertension 11/08/2019  ? Cough due to ACE inhibitor 11/08/2019  ? ?Past Medical History:  ?Diagnosis Date  ? Anterolisthesis   ? Grade 1 of L3 on L4 on x-ray 07/18/19  ? Aortic  regurgitation   ? Seen on 2D echo 07/07/16  ? Cataract   ? CKD (chronic kidney disease), stage III ([place])   ? DDD (degenerative disc disease), lumbar   ? Diabetes mellitus without complication ([place])   ? Diastolic CHF ([place])   ? Grade 2/4 with LVEF of 60-65%  ? Diastolic dysfunction   ? Grade 2/4 with LVEF of 60-65% and LVH  ? Gout   ? HLD (hyperlipidemia)   ? Hyperparathyroidism ([place])   ? Hypertension   ? Lumbar facet arthropathy   ? Memory impairment   ? Mitral valve regurgitation   ? Seen on 2D echo 07/07/16  ? OA (osteoarthritis)   ? OSA (obstructive sleep apnea)   ? Osteoarthritis   ? Polycythemia   ? Shingles   ? Sleep apnea   ? Thrombocytopenia ([place])   ? Vitamin D deficiency   ?  ?Family History  ?Problem Relation Age of Onset  ? Diabetes Mother   ? Hypertension Father   ? Hypertension Brother   ? Hypertension Sister   ? Diabetes Sister   ?  ?Past Surgical History:  ?Procedure Laterality Date  ? LAPAROSCOPIC HYSTERECTOMY    ? ?Social History  ? ?Occupational History  ?  Comment: NA  ?Tobacco Use  ? Smoking status: Never  ? Smokeless tobacco: Never  ?Substance and Sexual Activity  ? Alcohol use: Never  ? Drug use: Never  ? Sexual activity: Not Currently  ? ? ? ? ? ? ?

## 2021-06-18 NOTE — Telephone Encounter (Signed)
Called and scheduled patient

## 2021-06-19 ENCOUNTER — Ambulatory Visit: Payer: Medicaid Other | Admitting: Physician Assistant

## 2021-06-24 ENCOUNTER — Other Ambulatory Visit: Payer: Self-pay | Admitting: Family

## 2021-06-24 ENCOUNTER — Other Ambulatory Visit: Payer: Self-pay

## 2021-06-24 ENCOUNTER — Telehealth: Payer: Self-pay | Admitting: *Deleted

## 2021-06-24 MED ORDER — GLIPIZIDE 5 MG PO TABS: 5.0000 mg | ORAL_TABLET | Freq: Every day | ORAL | 1 refills | Status: DC

## 2021-06-24 NOTE — Telephone Encounter (Signed)
Received 58 Order for Urinary Incontinence Supplies-Adult Size pull on XXL/Bariatric ? ?Placed order in [place] to review and sign.  ?To be faxed back to [facility] Fax: [phone] once completed.  ?

## 2021-07-01 ENCOUNTER — Other Ambulatory Visit: Payer: Self-pay

## 2021-07-01 MED ORDER — GLIPIZIDE 5 MG PO TABS: 5.0000 mg | ORAL_TABLET | Freq: Every day | ORAL | 1 refills | Status: DC

## 2021-07-01 NOTE — Telephone Encounter (Signed)
Paper work already signed previous week.

## 2021-07-01 NOTE — Telephone Encounter (Signed)
Spoke with Aldona Bar from [facility] on [facility] orders for supplies for patient she left an alternate immediate fax number for when the paperwork is signed and re-faxed [phone]. Have you signed or completed the paperwork yet?

## 2021-07-01 NOTE — Telephone Encounter (Signed)
Pharmacy requested refill

## 2021-07-08 NOTE — Telephone Encounter (Signed)
Order signed.please refax to [facility] as requested.

## 2021-07-08 NOTE — Telephone Encounter (Signed)
Order signed and Faxed to [place] Fax:[phone]

## 2021-07-08 NOTE — Telephone Encounter (Signed)
[facility] called and stated they never received the Order for Incontinence Supplies for patient: Adult Size Pull on XXL Bariatric  Order refaxed for signature. Placed in [name]'s folder to review and sign. To be faxed back to Fax: [phone]

## 2021-07-09 ENCOUNTER — Telehealth: Payer: Medicaid Other | Admitting: *Deleted

## 2021-07-09 NOTE — Telephone Encounter (Signed)
Bubba Hales, Receptionist, Received email from [facility] [phone] with [facility] regarding a Prior Authorization form to be filled out for patient to receive lift chair.   Filled out form and attached order with OV notes Dated 03/24/2021 and placed in [name]'s folder to review.   To be faxed back to [facility] Fax: [phone].

## 2021-07-10 NOTE — Telephone Encounter (Signed)
[name] Approved PA Form.  Faxed to [facility] Fax: [phone]

## 2021-07-14 ENCOUNTER — Other Ambulatory Visit: Payer: Self-pay | Admitting: *Deleted

## 2021-07-14 DIAGNOSIS — F411 Generalized anxiety disorder: Secondary | ICD-10-CM

## 2021-07-14 MED ORDER — LORAZEPAM 0.5 MG PO TABS: 0.5000 mg | ORAL_TABLET | Freq: Two times a day (BID) | ORAL | 1 refills | Status: DC | PRN

## 2021-07-14 NOTE — Telephone Encounter (Signed)
Michelle Rowe, daughter called and stated that patient  1.) needs a refill sent to [facility]. Stated that the Rx could not be transferred.   Pended Rx and sent to [facility] for approval.   2.) Also requesting a Handicap Placard. Stated that the one they have got lost in the move.  Filled out and placed in [name]'s folder to review and sign.  Requesting to be mailed to home address once completed.

## 2021-07-14 NOTE — Telephone Encounter (Signed)
Handicap Placard placed in mail.  Copy sent for scanning.  LMOM with confirmation with daughter.

## 2021-07-14 NOTE — Telephone Encounter (Signed)
Handicap placard signed and given to Anita,CMA to be mailed as requested.

## 2021-07-20 ENCOUNTER — Telehealth: Payer: Self-pay | Admitting: *Deleted

## 2021-07-20 NOTE — Telephone Encounter (Signed)
Daughter dropped off CAP forms to be filled out by Provider for CAP Services.  Printed Problem list and Medication list and attached to form.  Placed in [name]'s folder to review, fill out and sign.  To be faxed to Fax: [phone] once completed.

## 2021-07-22 ENCOUNTER — Telehealth: Payer: Self-pay | Admitting: Family

## 2021-07-22 NOTE — Telephone Encounter (Signed)
Ngetich, [name], NP      07/22/21  8:42 AM Note [place] Department of health and Human services division of health benefits selection of CAP/DA case management Entity [facility]) forms completed.please fax forms or POA to pick -up.

## 2021-07-22 NOTE — Telephone Encounter (Signed)
CAP forms completed and faxed back to [facility] Dept of Health and Human. Fax: [phone].  Copy sent to scanning.

## 2021-07-22 NOTE — Telephone Encounter (Signed)
[place] Department of health and Human services division of health benefits selection of CAP/DA case management Entity [facility]) forms completed.please fax forms or POA to pick -up.

## 2021-07-22 NOTE — Telephone Encounter (Signed)
CAP forms received completed and signed. Faxed form to [place] Dept of Health and Human Fax: [phone] Copy sent to scanning.

## 2021-07-28 ENCOUNTER — Telehealth: Payer: Self-pay

## 2021-07-28 NOTE — Telephone Encounter (Signed)
Symantha with [facility] called to check the status of incontinence supplies, that has now been faxed to [place] x 9 since May with no reply.   I informed Symantha that Webb Silversmith signed order and it was faxed on 07/08/2021 to [phone].  Fulton Reek provided an alternate fax number of [phone] and I re-faxed order as requested

## 2021-07-29 ENCOUNTER — Telehealth: Payer: Self-pay

## 2021-07-29 NOTE — Telephone Encounter (Signed)
Sharyn Lull from [facility] called and stated that they have not received the fax from the alternate fax number they gave the office, [phone].   Left message for Sharyn Lull to give the office a call back to see how we can move forward.

## 2021-07-29 NOTE — Telephone Encounter (Signed)
Incoming call received from [place] with [facility] inquiring if lift chair order and pertinent information was faxed to [place], as he has tried to contact them on multiple occasions with no success to get a HpCp code, in which only they can provide.  Side Note: the HpCp code will classify requested DME as covered or not covered, in which they can then inform the patient of potential cost.   I informed Nicki Reaper that according to documentation, the lift chair order and pertinent information was faxed to [facility] (see telephone encounter dated 07/09/21). It appears that Rodena Piety was not instructed to send information to [place] provided me with the fax number, [phone] to send information to [place], as this is now a time sensitive request and if they do not make contact with [facility] today, this request will be processed as a denial.    I faxed information as instructed (wrote Time Sensitive at the top of cover page) and made a notation on the cover page for [facility] indicating that [facility] (patients insurance) has tried to contact them several times at [phone] with no success and if they could call our office with an updated contact number.

## 2021-07-29 NOTE — Telephone Encounter (Signed)
Sharyn Lull from [facility] called back and gave me two additional Fax numbers, [phone] and [phone].   Sharyn Lull stated that if these fax numbers do not work then we can mail the order or send her an email.

## 2021-08-03 ENCOUNTER — Telehealth: Payer: Self-pay

## 2021-08-06 ENCOUNTER — Other Ambulatory Visit: Payer: Self-pay | Admitting: Family

## 2021-08-06 ENCOUNTER — Other Ambulatory Visit: Payer: Self-pay | Admitting: *Deleted

## 2021-08-06 DIAGNOSIS — J302 Other seasonal allergic rhinitis: Secondary | ICD-10-CM

## 2021-08-06 MED ORDER — LORATADINE 10 MG PO TABS: 10.0000 mg | ORAL_TABLET | Freq: Every day | ORAL | 5 refills | Status: DC

## 2021-08-06 NOTE — Telephone Encounter (Signed)
[facility] requested refill  

## 2021-08-12 ENCOUNTER — Other Ambulatory Visit: Payer: Self-pay

## 2021-08-12 DIAGNOSIS — J302 Other seasonal allergic rhinitis: Secondary | ICD-10-CM

## 2021-08-12 MED ORDER — LORATADINE 10 MG PO TABS: 10.0000 mg | ORAL_TABLET | Freq: Every day | ORAL | 2 refills | Status: DC

## 2021-08-14 ENCOUNTER — Telehealth: Payer: Self-pay

## 2021-08-14 NOTE — Telephone Encounter (Signed)
Becky Kegel RN w/ Jackquline [place] of [place] trying to get the patient a raised toilet seat for a DME company called [employer]. Says she placed the order back on June 20th and they have been sending faxes to be faxed back with no success needing documentation dx code, quantity. Please locate fax and send back in [phone]  [facility] who thinks a Jenny Reichmann or Bubba Hales gave her a fax number of [phone]. Gave her the correct fax number, she will try having them resend again to the correct fax number.

## 2021-08-25 ENCOUNTER — Telehealth: Payer: Medicaid Other

## 2021-08-25 NOTE — Telephone Encounter (Signed)
Entered in error

## 2021-08-25 NOTE — Telephone Encounter (Signed)
On chart review,Lift chair documents and codes have been faxed multiple times.May consider faxing to different DME company to avoid further delay.

## 2021-08-25 NOTE — Telephone Encounter (Signed)
Incoming call received from [facility] with [facility] inquiring about a raise lift chair order and prescription for patient Michelle Rowe ,pertinent information was obtained and faxed again to the new fax number I was given this morning which was [phone] for [employer] medical supplies. This has been a prior issue since 07/28/2021 according to [facility] and that [facility] has still received no orders, prescription, quantity, or DX code, and documentation. I am sending this again and forwarding to PCP for further instructions.

## 2021-09-14 ENCOUNTER — Telehealth: Payer: Self-pay | Admitting: *Deleted

## 2021-09-14 NOTE — Telephone Encounter (Signed)
Patient dropped off [employer] Eligibility Application for Persons with Cognitive Impairments Form to have filled out by PCP.   Form placed in Michelle Rowe's folder to review, fill out and sign.   To call patient once ready for pick up.

## 2021-09-15 NOTE — Telephone Encounter (Signed)
Forms completed please fax as requested.

## 2021-09-16 NOTE — Telephone Encounter (Signed)
Forms given to [facility] to fax and call patient.  Melissa sent copy to scanning.

## 2021-09-21 ENCOUNTER — Other Ambulatory Visit: Payer: Medicaid Other

## 2021-09-23 ENCOUNTER — Other Ambulatory Visit: Payer: Self-pay | Admitting: Family

## 2021-09-23 ENCOUNTER — Ambulatory Visit: Payer: Medicaid Other | Admitting: Family

## 2021-09-23 DIAGNOSIS — M10061 Idiopathic gout, right knee: Secondary | ICD-10-CM

## 2021-09-24 ENCOUNTER — Other Ambulatory Visit: Payer: Medicaid Other

## 2021-09-24 DIAGNOSIS — E785 Hyperlipidemia, unspecified: Secondary | ICD-10-CM

## 2021-09-24 DIAGNOSIS — E1122 Type 2 diabetes mellitus with diabetic chronic kidney disease: Secondary | ICD-10-CM

## 2021-09-24 DIAGNOSIS — I5032 Chronic diastolic (congestive) heart failure: Secondary | ICD-10-CM

## 2021-09-24 DIAGNOSIS — I10 Essential (primary) hypertension: Secondary | ICD-10-CM

## 2021-09-24 DIAGNOSIS — E213 Hyperparathyroidism, unspecified: Secondary | ICD-10-CM

## 2021-09-25 ENCOUNTER — Other Ambulatory Visit: Payer: Medicaid Other

## 2021-09-26 LAB — CBC WITH DIFFERENTIAL/PLATELET
Absolute Monocytes: 448 cells/uL (ref 200–950)
Basophils Absolute: 32 cells/uL (ref 0–200)
Basophils Relative: 0.6 %
Eosinophils Absolute: 32 cells/uL (ref 15–500)
Eosinophils Relative: 0.6 %
HCT: 36.7 % (ref 35.0–45.0)
Hemoglobin: 12.1 g/dL (ref 11.7–15.5)
Lymphs Abs: 2311 cells/uL (ref 850–3900)
MCH: 31.6 pg (ref 27.0–33.0)
MCHC: 33 g/dL (ref 32.0–36.0)
MCV: 95.8 fL (ref 80.0–100.0)
MPV: 9.3 fL (ref 7.5–12.5)
Monocytes Relative: 8.3 %
Neutro Abs: 2576 cells/uL (ref 1500–7800)
Neutrophils Relative %: 47.7 %
Platelets: 272 10*3/uL (ref 140–400)
RBC: 3.83 10*6/uL (ref 3.80–5.10)
RDW: 12.6 % (ref 11.0–15.0)
Total Lymphocyte: 42.8 %
WBC: 5.4 10*3/uL (ref 3.8–10.8)

## 2021-09-26 LAB — LIPID PANEL
Cholesterol: 152 mg/dL (ref <200)
HDL: 68 mg/dL (ref >=50)
LDL Cholesterol (Calc): 70 mg/dL (calc)
Non-HDL Cholesterol (Calc): 84 mg/dL (calc) (ref <130)
Total CHOL/HDL Ratio: 2.2 (calc) (ref <5.0)
Triglycerides: 55 mg/dL (ref <150)

## 2021-09-26 LAB — COMPLETE METABOLIC PANEL WITH GFR
AG Ratio: 1.3 (calc) (ref 1.0–2.5)
ALT: 24 U/L (ref 6–29)
AST: 22 U/L (ref 10–35)
Albumin: 3.9 g/dL (ref 3.6–5.1)
Alkaline phosphatase (APISO): 59 U/L (ref 37–153)
BUN/Creatinine Ratio: 20 (calc) (ref 6–22)
BUN: 37 mg/dL — ABNORMAL HIGH (ref 7–25)
CO2: 20 mmol/L (ref 20–32)
Calcium: 9.6 mg/dL (ref 8.6–10.4)
Chloride: 111 mmol/L — ABNORMAL HIGH (ref 98–110)
Creat: 1.85 mg/dL — ABNORMAL HIGH (ref 0.60–0.95)
Globulin: 3 g/dL (calc) (ref 1.9–3.7)
Glucose, Bld: 115 mg/dL — ABNORMAL HIGH (ref 65–99)
Potassium: 5.4 mmol/L — ABNORMAL HIGH (ref 3.5–5.3)
Sodium: 140 mmol/L (ref 135–146)
Total Bilirubin: 0.3 mg/dL (ref 0.2–1.2)
Total Protein: 6.9 g/dL (ref 6.1–8.1)
eGFR: 27 mL/min/{1.73_m2} — ABNORMAL LOW (ref >=60)

## 2021-09-26 LAB — HEMOGLOBIN A1C
Hgb A1c MFr Bld: 5.9 % of total Hgb — ABNORMAL HIGH (ref <5.7)
Mean Plasma Glucose: 123 mg/dL
eAG (mmol/L): 6.8 mmol/L

## 2021-09-28 ENCOUNTER — Ambulatory Visit (INDEPENDENT_AMBULATORY_CARE_PROVIDER_SITE_OTHER): Payer: Medicaid Other | Admitting: Physician Assistant

## 2021-09-28 ENCOUNTER — Encounter: Payer: Self-pay | Admitting: Physician Assistant

## 2021-09-28 ENCOUNTER — Encounter: Payer: Self-pay | Admitting: Adult Health

## 2021-09-28 ENCOUNTER — Ambulatory Visit: Payer: Medicaid Other | Admitting: Family

## 2021-09-28 ENCOUNTER — Ambulatory Visit (INDEPENDENT_AMBULATORY_CARE_PROVIDER_SITE_OTHER): Payer: Medicaid Other | Admitting: Adult Health

## 2021-09-28 VITALS — BP 142/78 | HR 50 | Temp 97.7°F | Resp 18 | Ht 64.0 in | Wt 199.0 lb

## 2021-09-28 DIAGNOSIS — M25561 Pain in right knee: Secondary | ICD-10-CM

## 2021-09-28 DIAGNOSIS — E785 Hyperlipidemia, unspecified: Secondary | ICD-10-CM | POA: Diagnosis not present

## 2021-09-28 DIAGNOSIS — M159 Polyosteoarthritis, unspecified: Secondary | ICD-10-CM

## 2021-09-28 DIAGNOSIS — E1122 Type 2 diabetes mellitus with diabetic chronic kidney disease: Secondary | ICD-10-CM

## 2021-09-28 DIAGNOSIS — M1711 Unilateral primary osteoarthritis, right knee: Secondary | ICD-10-CM | POA: Diagnosis not present

## 2021-09-28 DIAGNOSIS — M25562 Pain in left knee: Secondary | ICD-10-CM | POA: Diagnosis not present

## 2021-09-28 DIAGNOSIS — I10 Essential (primary) hypertension: Secondary | ICD-10-CM

## 2021-09-28 DIAGNOSIS — M1A9XX0 Chronic gout, unspecified, without tophus (tophi): Secondary | ICD-10-CM

## 2021-09-28 DIAGNOSIS — M170 Bilateral primary osteoarthritis of knee: Secondary | ICD-10-CM

## 2021-09-28 DIAGNOSIS — N184 Chronic kidney disease, stage 4 (severe): Secondary | ICD-10-CM

## 2021-09-28 DIAGNOSIS — M150 Primary generalized (osteo)arthritis: Secondary | ICD-10-CM

## 2021-09-28 DIAGNOSIS — J302 Other seasonal allergic rhinitis: Secondary | ICD-10-CM

## 2021-09-28 MED ORDER — FLUTICASONE PROPIONATE 50 MCG/ACT NA SUSP: NASAL | 11 refills | Status: DC

## 2021-09-28 MED ORDER — LEVOCETIRIZINE DIHYDROCHLORIDE 5 MG PO TABS: 5.0000 mg | ORAL_TABLET | Freq: Every evening | ORAL | 3 refills | Status: DC

## 2021-09-28 MED ORDER — ASPIRIN 81 MG PO CHEW: 81.0000 mg | CHEWABLE_TABLET | Freq: Every day | ORAL | 11 refills | Status: DC

## 2021-09-28 MED ORDER — LIDOCAINE HCL 1 % IJ SOLN
2.0000 mL | INTRAMUSCULAR | Status: AC | PRN
  Administered 2021-09-28: 2 mL

## 2021-09-28 MED ORDER — SIMVASTATIN 10 MG PO TABS: 10.0000 mg | ORAL_TABLET | Freq: Every day | ORAL | 3 refills | Status: DC

## 2021-09-28 MED ORDER — HYDRALAZINE HCL 25 MG PO TABS: 25.0000 mg | ORAL_TABLET | Freq: Three times a day (TID) | ORAL | 3 refills | Status: DC

## 2021-09-28 MED ORDER — AMLODIPINE BESYLATE 10 MG PO TABS: 10.0000 mg | ORAL_TABLET | Freq: Every day | ORAL | 3 refills | Status: DC

## 2021-09-28 MED ORDER — LOSARTAN POTASSIUM 50 MG PO TABS: 50.0000 mg | ORAL_TABLET | Freq: Every day | ORAL | 3 refills | Status: DC

## 2021-09-28 MED ORDER — METHYLPREDNISOLONE ACETATE 40 MG/ML IJ SUSP
80.0000 mg | INTRAMUSCULAR | Status: AC | PRN
  Administered 2021-09-28: 80 mg via INTRA_ARTICULAR

## 2021-09-28 MED ORDER — GUAIFENESIN ER 600 MG PO TB12
600.0000 mg | ORAL_TABLET | Freq: Two times a day (BID) | ORAL | 0 refills | Status: DC | PRN
Start: 2021-09-28 — End: 2023-10-26

## 2021-09-28 MED ORDER — BUPIVACAINE HCL 0.25 % IJ SOLN
2.0000 mL | INTRAMUSCULAR | Status: AC | PRN
  Administered 2021-09-28: 2 mL via INTRA_ARTICULAR

## 2021-09-28 MED ORDER — GLIPIZIDE 5 MG PO TABS: 5.0000 mg | ORAL_TABLET | Freq: Every day | ORAL | 3 refills | Status: DC

## 2021-09-28 MED ORDER — ALLOPURINOL 100 MG PO TABS: ORAL_TABLET | ORAL | 3 refills | Status: DC

## 2021-09-28 MED ORDER — GLUCOSE BLOOD VI STRP: 1.0000 | ORAL_STRIP | 3 refills | Status: DC | PRN

## 2021-09-28 MED ORDER — LORATADINE 10 MG PO TABS: 10.0000 mg | ORAL_TABLET | Freq: Every day | ORAL | 3 refills | Status: DC | PRN

## 2021-09-28 NOTE — Progress Notes (Signed)
Office Visit Note   Patient: Michelle Rowe           Date of Birth: 07/19/1940           MRN: [id number] Visit Date: 09/28/2021              Requested by: Sandrea Hughs, NP [address] [place],  [place] [address] PCP: Sandrea Hughs, NP  Chief Complaint  Patient presents with  . Right Knee - Follow-up      HPI: Patient is a pleasant 81 year old woman who is accompanied by her daughter.  She has a history of right greater than left knee arthritis.  She last had an injection in May into her right knee.  Her daughter who is her caregiver says that she is having more pain no recent injury change in activity.  Her daughter describes the pain as making it limited for ambulation.  Assessment & Plan: Visit Diagnoses:  1. Bilateral primary osteoarthritis of knee     Plan: We will go forward with an injection into the right knee today.  Discussed if she does not get adequate relief from this we could consider viscosupplementation.  Also given her prescription per her request for a quad cane. .vis Follow-Up Instructions: As needed  Ortho Exam  Patient is alert, oriented, no adenopathy, well-dressed, normal affect, normal respiratory effort. Right knee no effusion no redness no cellulitis.  She does have a lot of crepitus with range of motion lower leg is soft and nontender  Imaging: No results found. No images are attached to the encounter.  Labs: Lab Results  Component Value Date   HGBA1C 5.9 (H) 09/25/2021   HGBA1C 6.1 (H) 03/24/2021   HGBA1C 6.1 (H) 08/15/2020   LABURIC 3.0 10/02/2019     Lab Results  Component Value Date   ALBUMIN 4.3 12/06/2019   ALBUMIN 3.7 07/14/2019   ALBUMIN 4.4 03/24/2018    No results found for: "MG" Lab Results  Component Value Date   VD25OH 35.46 12/06/2019    No results found for: "PREALBUMIN"    Latest Ref Rng & Units 09/25/2021    8:03 AM 03/24/2021   12:10 PM 08/15/2020    8:39 AM  CBC EXTENDED  WBC 3.8 - 10.8 Thousand/uL 5.4   5.4  6.4   RBC 3.80 - 5.10 Million/uL 3.83  3.87  3.98   Hemoglobin 11.7 - 15.5 g/dL 12.1  12.1  12.3   HCT 35.0 - 45.0 % 36.7  36.3  37.5   Platelets 140 - 400 Thousand/uL 272  322  368   NEUT# 1,500 - 7,800 cells/uL 2,576  2,349  3,603   Lymph# 850 - 3,900 cells/uL 2,311  2,592  2,470      There is no height or weight on file to calculate BMI.  Orders:  No orders of the defined types were placed in this encounter.  No orders of the defined types were placed in this encounter.    Procedures: Large Joint Inj: R knee on 09/28/2021 4:01 PM Indications: pain and diagnostic evaluation Details: 22 G 1.5 in needle, anteromedial approach  Arthrogram: No  Medications: 80 mg methylPREDNISolone acetate 40 MG/ML; 2 mL lidocaine 1 %; 2 mL bupivacaine 0.25 % Outcome: tolerated well, no immediate complications Procedure, treatment alternatives, risks and benefits explained, specific risks discussed. Consent was given by the patient.    Clinical Data: No additional findings.  ROS:  All other systems negative, except as noted in the  HPI. Review of Systems  Objective: Vital Signs: There were no vitals taken for this visit.  Specialty Comments:  No specialty comments available.  PMFS History: Patient Active Problem List   Diagnosis Date Noted  . Bilateral primary osteoarthritis of knee 06/18/2021  . Diastolic dysfunction without heart failure 11/08/2019  . Essential hypertension 11/08/2019  . Cough due to ACE inhibitor 11/08/2019   Past Medical History:  Diagnosis Date  . Anterolisthesis    Grade 1 of L3 on L4 on x-ray 07/18/19  . Aortic regurgitation    Seen on 2D echo 07/07/16  . Cataract   . CKD (chronic kidney disease), stage III ([place])   . DDD (degenerative disc disease), lumbar   . Diabetes mellitus without complication ([place])   . Diastolic CHF ([facility])    Grade 2/4 with LVEF of 60-65%  . Diastolic dysfunction    Grade 2/4 with LVEF of 60-65% and LVH  . Gout   . HLD  (hyperlipidemia)   . Hyperparathyroidism ([place])   . Hypertension   . Lumbar facet arthropathy   . Memory impairment   . Mitral valve regurgitation    Seen on 2D echo 07/07/16  . OA (osteoarthritis)   . OSA (obstructive sleep apnea)   . Osteoarthritis   . Polycythemia   . Shingles   . Sleep apnea   . Thrombocytopenia ([place])   . Vitamin D deficiency     Family History  Problem Relation Age of Onset  . Diabetes Mother   . Hypertension Father   . Hypertension Brother   . Hypertension Sister   . Diabetes Sister     Past Surgical History:  Procedure Laterality Date  . LAPAROSCOPIC HYSTERECTOMY     Social History   Occupational History    Comment: NA  Tobacco Use  . Smoking status: Never  . Smokeless tobacco: Never  Substance and Sexual Activity  . Alcohol use: Never  . Drug use: Never  . Sexual activity: Not Currently

## 2021-09-28 NOTE — Patient Instructions (Signed)
Hypertension, Adult ?Hypertension is another name for high blood pressure. High blood pressure forces your heart to work harder to pump blood. This can cause problems over time. ?There are two numbers in a blood pressure reading. There is a top number (systolic) over a bottom number (diastolic). It is best to have a blood pressure that is below 120/80. ?What are the causes? ?The cause of this condition is not known. Some other conditions can lead to high blood pressure. ?What increases the risk? ?Some lifestyle factors can make you more likely to develop high blood pressure: ?Smoking. ?Not getting enough exercise or physical activity. ?Being overweight. ?Having too much fat, sugar, calories, or salt (sodium) in your diet. ?Drinking too much alcohol. ?Other risk factors include: ?Having any of these conditions: ?Heart disease. ?Diabetes. ?High cholesterol. ?Kidney disease. ?Obstructive sleep apnea. ?Having a family history of high blood pressure and high cholesterol. ?Age. The risk increases with age. ?Stress. ?What are the signs or symptoms? ?High blood pressure may not cause symptoms. Very high blood pressure (hypertensive crisis) may cause: ?Headache. ?Fast or uneven heartbeats (palpitations). ?Shortness of breath. ?Nosebleed. ?Vomiting or feeling like you may vomit (nauseous). ?Changes in how you see. ?Very bad chest pain. ?Feeling dizzy. ?Seizures. ?How is this treated? ?This condition is treated by making healthy lifestyle changes, such as: ?Eating healthy foods. ?Exercising more. ?Drinking less alcohol. ?Your doctor may prescribe medicine if lifestyle changes do not help enough and if: ?Your top number is above 130. ?Your bottom number is above 80. ?Your personal target blood pressure may vary. ?Follow these instructions at home: ?Eating and drinking ? ?If told, follow the DASH eating plan. To follow this plan: ?Fill one half of your plate at each meal with fruits and vegetables. ?Fill one fourth of your plate  at each meal with whole grains. Whole grains include whole-wheat pasta, brown rice, and whole-grain bread. ?Eat or drink low-fat dairy products, such as skim milk or low-fat yogurt. ?Fill one fourth of your plate at each meal with low-fat (lean) proteins. Low-fat proteins include fish, chicken without skin, eggs, beans, and tofu. ?Avoid fatty meat, cured and processed meat, or chicken with skin. ?Avoid pre-made or processed food. ?Limit the amount of salt in your diet to less than 1,500 mg each day. ?Do not drink alcohol if: ?Your doctor tells you not to drink. ?You are pregnant, may be pregnant, or are planning to become pregnant. ?If you drink alcohol: ?Limit how much you have to: ?0-1 drink a day for women. ?0-2 drinks a day for men. ?Know how much alcohol is in your drink. In the [place] one drink equals one 12 oz bottle of beer (355 mL), one 5 oz glass of wine (148 mL), or one 1? oz glass of hard liquor (44 mL). ?Lifestyle ? ?Work with your doctor to stay at a healthy weight or to lose weight. Ask your doctor what the best weight is for you. ?Get at least 30 minutes of exercise that causes your heart to beat faster (aerobic exercise) most days of the week. This may include walking, swimming, or biking. ?Get at least 30 minutes of exercise that strengthens your muscles (resistance exercise) at least 3 days a week. This may include lifting weights or doing Pilates. ?Do not smoke or use any products that contain nicotine or tobacco. If you need help quitting, ask your doctor. ?Check your blood pressure at home as told by your doctor. ?Keep all follow-up visits. ?Medicines ?Take over-the-counter and prescription medicines   only as told by your doctor. Follow directions carefully. ?Do not skip doses of blood pressure medicine. The medicine does not work as well if you skip doses. Skipping doses also puts you at risk for problems. ?Ask your doctor about side effects or reactions to medicines that you should watch  for. ?Contact a doctor if: ?You think you are having a reaction to the medicine you are taking. ?You have headaches that keep coming back. ?You feel dizzy. ?You have swelling in your ankles. ?You have trouble with your vision. ?Get help right away if: ?You get a very bad headache. ?You start to feel mixed up (confused). ?You feel weak or numb. ?You feel faint. ?You have very bad pain in your: ?Chest. ?Belly (abdomen). ?You vomit more than once. ?You have trouble breathing. ?These symptoms may be an emergency. Get help right away. Call 911. ?Do not wait to see if the symptoms will go away. ?Do not drive yourself to the hospital. ?Summary ?Hypertension is another name for high blood pressure. ?High blood pressure forces your heart to work harder to pump blood. ?For most people, a normal blood pressure is less than 120/80. ?Making healthy choices can help lower blood pressure. If your blood pressure does not get lower with healthy choices, you may need to take medicine. ?This information is not intended to replace advice given to you by your health care provider. Make sure you discuss any questions you have with your health care provider. ?Document Revised: 11/13/2020 Document Reviewed: 11/13/2020 ?Elsevier Patient Education ? 2023 [employer] ? ?

## 2021-09-28 NOTE — Progress Notes (Signed)
Location:   Therapist, nutritional of Service:   [place] Provider:   Jaymes Graff Medina-Vargas DNP  Ngetich, [name], NP  Patient Care Team: Ngetich, [name], NP as PCP - General (Family Medicine)  Extended Emergency Contact Information Primary Emergency Contact: Sanayah, Munro Home Phone: [phone] Mobile Phone: [phone] Relation: Daughter Secondary Emergency Contact: [place], [place] Phone: [phone] Mobile Phone: [phone] Relation: Daughter  Code Status:  Full Code Goals of care: Advanced Directive information    09/28/2021    2:43 PM  Advanced Directives  Does Patient Have a Medical Advance Directive? No  Would patient like information on creating a medical advance directive? No - Patient declined     Chief Complaint  Patient presents with   Medical Management of Chronic Issues    Patient is here for a 60M F/U and lab work, patient due for 2nd shingrix vaccine and flu. Discuss need for updated covid    HPI:  Pt is a 81 y.o. female seen today for follow up of chronic medical issues.  Essential hypertension - BP today  142/78, takes Amlodipine, Hydralazine and Losartan  Type 2 diabetes mellitus with stage 4 chronic kidney disease, without long-term current use of insulin (HCC) - latest GFR 27, down from 35, now ranging in CKD stage 4; will need to hold metformin and repeat BMP in  2 weeks, takes Glucotrol  Hyperlipidemia LDL goal <70 -  takes Zocor 10 mg at bedtime  Primary osteoarthritis involving multiple joints has orthopedic appointment today  Seasonal allergic rhinitis, unspecified trigger - takes fluticasone (FLONASE) , levocetirizine (XYZAL) 5 MG tablet,and  loratadine (CLARITIN), daughter requesting referral to Allergy  Chronic gout without tophus, unspecified cause, unspecified site - no flare ups, takes Allopurinol    Past Medical History:  Diagnosis Date   Anterolisthesis    Grade 1 of L3 on L4 on x-ray 07/18/19    Aortic regurgitation    Seen on 2D echo 07/07/16   Cataract    CKD (chronic kidney disease), stage III (HCC)    DDD (degenerative disc disease), lumbar    Diabetes mellitus without complication (HCC)    Diastolic CHF (HCC)    Grade 2/4 with LVEF of 97-67%   Diastolic dysfunction    Grade 2/4 with LVEF of 60-65% and LVH   Gout    HLD (hyperlipidemia)    Hyperparathyroidism (HCC)    Hypertension    Lumbar facet arthropathy    Memory impairment    Mitral valve regurgitation    Seen on 2D echo 07/07/16   OA (osteoarthritis)    OSA (obstructive sleep apnea)    Osteoarthritis    Polycythemia    Shingles    Sleep apnea    Thrombocytopenia (HCC)    Vitamin D deficiency    Past Surgical History:  Procedure Laterality Date   LAPAROSCOPIC HYSTERECTOMY      Allergies  Allergen Reactions   Augmentin [Amoxicillin-Pot Clavulanate] Rash    Critically high - Onset 07/13/2019   Amoxicillin     Did it involve swelling of the face/tongue/throat, SOB, or low BP? Y Did it involve sudden or severe rash/hives, skin peeling, or any reaction on the inside of your mouth or nose? Y (Rash, Swelling, SOB) Did you need to seek medical attention at a hospital or doctor's office? Y When did it last happen?  Yesterday     If all above answers are "NO", may proceed with cephalosporin use.    Shellfish  Allergy     Outpatient Encounter Medications as of 09/28/2021  Medication Sig   allopurinol (ZYLOPRIM) 100 MG tablet Take 1 tablet by mouth once daily   amLODipine (NORVASC) 10 MG tablet Take 1 tablet (10 mg total) by mouth daily.   aspirin 81 MG chewable tablet Chew 81 mg by mouth daily.   fluticasone (FLONASE) 50 MCG/ACT nasal spray USE 1 SPRAY(S) IN EACH NOSTRIL TWICE DAILY AS NEEDED FOR ALLERGIES   glipiZIDE (GLUCOTROL) 5 MG tablet Take 1 tablet (5 mg total) by mouth daily.   glucose blood test strip 1 each by Other route as needed for other. Use as instructed   guaiFENesin (MUCINEX) 600 MG 12 hr  tablet Take 600 mg by mouth 2 (two) times daily as needed.   hydrALAZINE (APRESOLINE) 25 MG tablet Take 1 tablet (25 mg total) by mouth 3 (three) times daily.   Lancets Ultra Thin 30G MISC Inject 1 each into the skin daily.   levocetirizine (XYZAL) 5 MG tablet Take 1 tablet (5 mg total) by mouth every evening.   loratadine (CLARITIN) 10 MG tablet Take 1 tablet (10 mg total) by mouth daily.   LORazepam (ATIVAN) 0.5 MG tablet Take 1 tablet (0.5 mg total) by mouth 2 (two) times daily as needed for anxiety.   losartan (COZAAR) 50 MG tablet Take 1 tablet (50 mg total) by mouth daily.   metFORMIN (GLUCOPHAGE) 500 MG tablet Take 1 tablet (500 mg total) by mouth daily with breakfast.   simvastatin (ZOCOR) 10 MG tablet Take 1 tablet (10 mg total) by mouth at bedtime.   No facility-administered encounter medications on file as of 09/28/2021.    Review of Systems  Unable to obtain due to dementia.  Immunization History  Administered Date(s) Administered   Fluad Quad(high Dose 65+) 11/08/2019   Influenza-Unspecified 10/31/2018, 10/16/2020   Moderna Sars-Covid-2 Vaccination 04/25/2019, 05/23/2019, 12/25/2019   Pneumococcal Conjugate-13 05/06/2015   Pneumococcal Polysaccharide-23 10/30/2013   Tdap 12/31/2013   Zoster Recombinat (Shingrix) 10/16/2020   Pertinent  Health Maintenance Due  Topic Date Due   INFLUENZA VACCINE  09/08/2021   DEXA SCAN  Completed      02/05/2020    1:13 PM 03/27/2020   10:45 AM 08/07/2020    9:06 AM 03/24/2021    9:34 AM 09/28/2021    2:43 PM  Fall Risk  Falls in the past year? 0 0 0 0 0  Was there an injury with Fall? 0 0 0 0 0  Fall Risk Category Calculator 0 0 0 0 0  Fall Risk Category _0   Patient Fall Risk Level Low fall risk Low fall risk Low fall risk Low fall risk   Patient at Risk for Falls Due to   No Fall Risks No Fall Risks No Fall Risks  Fall risk Follow up   Falls evaluation completed Falls evaluation completed Falls evaluation completed    Functional Status Survey:    Vitals:   09/28/21 1445  BP: (!) 142/78  Pulse: (!) 50  Resp: 18  Temp: 97.7 F (36.5 C)  TempSrc: Temporal  SpO2: 98%  Weight: 199 lb (90.3 kg)  Height: _1  (1.626 m)   Body mass index is 34.16 kg/m. Physical Exam Constitutional:      General: She is not in acute distress.    Appearance: She is obese.  HENT:     Head: Normocephalic and atraumatic.     Nose: Nose normal.     Mouth/Throat:  Mouth: Mucous membranes are moist.  Eyes:     Conjunctiva/sclera: Conjunctivae normal.  Cardiovascular:     Rate and Rhythm: Normal rate and regular rhythm.  Pulmonary:     Effort: Pulmonary effort is normal.     Breath sounds: Normal breath sounds.  Abdominal:     General: Bowel sounds are normal.     Palpations: Abdomen is soft.  Musculoskeletal:     Cervical back: Normal range of motion.     Comments: Right knee edema  Skin:    General: Skin is warm and dry.  Neurological:     Mental Status: She is alert. Mental status is at baseline. She is disoriented.  Psychiatric:        Mood and Affect: Mood normal.        Behavior: Behavior normal.     Labs reviewed: Recent Labs    03/24/21 1210 09/25/21 0803  NA 139 140  K 5.3 5.4*  CL 105 111*  CO2 25 20  GLUCOSE 100* 115*  BUN 26* 37*  CREATININE 1.48* 1.85*  CALCIUM 9.9 9.6   Recent Labs    03/24/21 1210 09/25/21 0803  AST 17 22  ALT 13 24  BILITOT 0.4 0.3  PROT 7.1 6.9   Recent Labs    03/24/21 1210 09/25/21 0803  WBC 5.4 5.4  NEUTROABS 2,349 2,576  HGB 12.1 12.1  HCT 36.3 36.7  MCV 93.8 95.8  PLT 322 272   Lab Results  Component Value Date   TSH 1.17 03/24/2021   Lab Results  Component Value Date   HGBA1C 5.9 (H) 09/25/2021   Lab Results  Component Value Date   CHOL 152 09/25/2021   HDL 68 09/25/2021   LDLCALC 70 09/25/2021   TRIG 55 09/25/2021   CHOLHDL 2.2 09/25/2021    Significant Diagnostic Results in last 30 days:  No results  found.  Assessment/Plan  1. Essential hypertension - stable - amLODipine (NORVASC) 10 MG tablet; Take 1 tablet (10 mg total) by mouth daily.  Dispense: 30 tablet; Refill: 3 - hydrALAZINE (APRESOLINE) 25 MG tablet; Take 1 tablet (25 mg total) by mouth 3 (three) times daily.  Dispense: 90 tablet; Refill: 3 - losartan (COZAAR) 50 MG tablet; Take 1 tablet (50 mg total) by mouth daily.  Dispense: 30 tablet; Refill: 3 - aspirin 81 MG chewable tablet; Chew 1 tablet (81 mg total) by mouth daily.  Dispense: 30 tablet; Refill: 11  2. Type 2 diabetes mellitus with stage 4 chronic kidney disease, without long-term current use of insulin (HCC) Lab Results  Component Value Date   HGBA1C 5.9 (H) 09/25/2021   -  will hold metformin for now due to gfr 27, will repeat BMP in 2 weeks - glucose blood test strip; 1 each by Other route as needed for other. Use as instructed  Dispense: 100 each; Refill: 3 - BMP with eGFR([facility]); Future - glipiZIDE (GLUCOTROL) 5 MG tablet; Take 1 tablet (5 mg total) by mouth daily.  Dispense: 30 tablet; Refill: 3 -  monitor CBGs  3. Hyperlipidemia LDL goal <70 Lab Results  Component Value Date   CHOL 152 09/25/2021   HDL 68 09/25/2021   LDLCALC 70 09/25/2021   TRIG 55 09/25/2021   CHOLHDL 2.2 09/25/2021   - simvastatin (ZOCOR) 10 MG tablet; Take 1 tablet (10 mg total) by mouth at bedtime.  Dispense: 30 tablet; Refill: 3  4. Primary osteoarthritis involving multiple joints -  has an orthopedic appointment today  5.  Seasonal allergic rhinitis, unspecified trigger - fluticasone (FLONASE) 50 MCG/ACT nasal spray; USE 1 SPRAY(S) IN EACH NOSTRIL TWICE DAILY AS NEEDED FOR ALLERGIES  Dispense: 16 g; Refill: 11 - levocetirizine (XYZAL) 5 MG tablet; Take 1 tablet (5 mg total) by mouth every evening.  Dispense: 30 tablet; Refill: 3 - loratadine (CLARITIN) 10 MG tablet; Take 1 tablet (10 mg total) by mouth daily as needed for allergies.  Dispense: 30 tablet; Refill: 3 -  Ambulatory referral to Allergy  6. Chronic gout without tophus, unspecified cause, unspecified site -  stable - allopurinol (ZYLOPRIM) 100 MG tablet; Take 1 tablet by mouth once daily  Dispense: 30 tablet; Refill: 3    Labs/tests ordered:   BMP in 2 weeks  Follow up in 2 weeks

## 2021-09-30 ENCOUNTER — Ambulatory Visit: Payer: Medicaid Other | Admitting: Family

## 2021-10-01 ENCOUNTER — Encounter: Payer: Medicaid Other | Admitting: Family

## 2021-10-01 NOTE — Progress Notes (Signed)
  This encounter was created in error - please disregard. No show 

## 2021-10-02 ENCOUNTER — Ambulatory Visit (INDEPENDENT_AMBULATORY_CARE_PROVIDER_SITE_OTHER): Payer: Medicaid Other | Admitting: Family

## 2021-10-02 ENCOUNTER — Encounter: Payer: Self-pay | Admitting: Family

## 2021-10-02 VITALS — BP 110/70 | HR 59 | Temp 98.1°F | Resp 18 | Ht 64.0 in | Wt 201.0 lb

## 2021-10-02 DIAGNOSIS — M159 Polyosteoarthritis, unspecified: Secondary | ICD-10-CM | POA: Diagnosis not present

## 2021-10-02 DIAGNOSIS — E1122 Type 2 diabetes mellitus with diabetic chronic kidney disease: Secondary | ICD-10-CM | POA: Diagnosis not present

## 2021-10-02 DIAGNOSIS — N184 Chronic kidney disease, stage 4 (severe): Secondary | ICD-10-CM

## 2021-10-02 DIAGNOSIS — E785 Hyperlipidemia, unspecified: Secondary | ICD-10-CM

## 2021-10-02 DIAGNOSIS — I10 Essential (primary) hypertension: Secondary | ICD-10-CM

## 2021-10-02 DIAGNOSIS — B372 Candidiasis of skin and nail: Secondary | ICD-10-CM

## 2021-10-02 MED ORDER — NYSTATIN 100000 UNIT/GM EX CREA: 1.0000 | TOPICAL_CREAM | Freq: Two times a day (BID) | CUTANEOUS | 0 refills | Status: DC

## 2021-10-02 NOTE — Progress Notes (Unsigned)
Provider: Marlowe Sax FNP-C   Michelle Rowe, Michelle Bucks, NP  Patient Care Team: Dejuana Weist, Michelle Bucks, NP as PCP - General (Family Medicine)  Extended Emergency Contact Information Primary Emergency Contact: [place], [place]e Home Phone: [phone] Mobile Phone: [phone] Relation: Daughter Secondary Emergency Contact: [place], [place]e Phone: [phone] Mobile Phone: [phone] Relation: Daughter  Code Status:  Full Code  Goals of care: Advanced Directive information    10/02/2021    2:03 PM  Advanced Directives  Does Patient Have a Medical Advance Directive? No  Would patient like information on creating a medical advance directive? No - Patient declined     Chief Complaint  Patient presents with   Review Labs    PCP , Marlowe Sax, NP requested that patient came into office to review recent lab work.     HPI:  Pt is a 81 y.o. female seen today to discuss lab results.  Recent lab results done on 09/25/2021 reviewed and discussed with patient and daughter.    Labs unremarkable except hemoglobin A1c was slightly high 5.9 and glucose 115 though has improved compared to previous 6.1.  Kidney functions have also worsened BUN 37 and creatinine 1.85 with a GFR of 27 previous BUN was 26 creatinine 1.48 and GFR of 35  Daughter requests refill for antifungal cream.   Past Medical History:  Diagnosis Date   Anterolisthesis    Grade 1 of L3 on L4 on x-ray 07/18/19   Aortic regurgitation    Seen on 2D echo 07/07/16   Cataract    CKD (chronic kidney disease), stage III (HCC)    DDD (degenerative disc disease), lumbar    Diabetes mellitus without complication (HCC)    Diastolic CHF (HCC)    Grade 2/4 with LVEF of 28-41%   Diastolic dysfunction    Grade 2/4 with LVEF of 60-65% and LVH   Gout    HLD (hyperlipidemia)    Hyperparathyroidism (HCC)    Hypertension    Lumbar facet arthropathy    Memory impairment    Mitral valve regurgitation    Seen on 2D echo 07/07/16    OA (osteoarthritis)    OSA (obstructive sleep apnea)    Osteoarthritis    Polycythemia    Shingles    Sleep apnea    Thrombocytopenia (HCC)    Vitamin D deficiency    Past Surgical History:  Procedure Laterality Date   LAPAROSCOPIC HYSTERECTOMY      Allergies  Allergen Reactions   Augmentin [Amoxicillin-Pot Clavulanate] Rash    Critically high - Onset 07/13/2019   Amoxicillin     Did it involve swelling of the face/tongue/throat, SOB, or low BP? Y Did it involve sudden or severe rash/hives, skin peeling, or any reaction on the inside of your mouth or nose? Y (Rash, Swelling, SOB) Did you need to seek medical attention at a hospital or doctor's office? Y When did it last happen?  Yesterday     If all above answers are "NO", may proceed with cephalosporin use.    Shellfish Allergy     Allergies as of 10/02/2021       Reactions   Augmentin [amoxicillin-pot Clavulanate] Rash   Critically high - Onset 07/13/2019   Amoxicillin    Did it involve swelling of the face/tongue/throat, SOB, or low BP? Y Did it involve sudden or severe rash/hives, skin peeling, or any reaction on the inside of your mouth or nose? Y (Rash, Swelling, SOB) Did you need to seek medical attention at  a hospital or doctor's office? Y When did it last happen?  Yesterday     If all above answers are "NO", may proceed with cephalosporin use.   Shellfish Allergy         Medication List        Accurate as of October 02, 2021  2:12 PM. If you have any questions, ask your nurse or doctor.          STOP taking these medications    metFORMIN 500 MG tablet Commonly known as: GLUCOPHAGE Stopped by: Sandrea Hughs, NP       TAKE these medications    allopurinol 100 MG tablet Commonly known as: ZYLOPRIM Take 1 tablet by mouth once daily   amLODipine 10 MG tablet Commonly known as: NORVASC Take 1 tablet (10 mg total) by mouth daily.   aspirin 81 MG chewable tablet Chew 1 tablet (81 mg total) by  mouth daily.   fluticasone 50 MCG/ACT nasal spray Commonly known as: FLONASE USE 1 SPRAY(S) IN EACH NOSTRIL TWICE DAILY AS NEEDED FOR ALLERGIES   glipiZIDE 5 MG tablet Commonly known as: GLUCOTROL Take 1 tablet (5 mg total) by mouth daily.   glucose blood test strip 1 each by Other route as needed for other. Use as instructed   guaiFENesin 600 MG 12 hr tablet Commonly known as: MUCINEX Take 1 tablet (600 mg total) by mouth 2 (two) times daily as needed.   hydrALAZINE 25 MG tablet Commonly known as: APRESOLINE Take 1 tablet (25 mg total) by mouth 3 (three) times daily.   Lancets Ultra Thin 30G Misc Inject 1 each into the skin daily.   levocetirizine 5 MG tablet Commonly known as: XYZAL Take 1 tablet (5 mg total) by mouth every evening.   loratadine 10 MG tablet Commonly known as: CLARITIN Take 1 tablet (10 mg total) by mouth daily as needed for allergies.   LORazepam 0.5 MG tablet Commonly known as: ATIVAN Take 1 tablet (0.5 mg total) by mouth 2 (two) times daily as needed for anxiety.   losartan 50 MG tablet Commonly known as: COZAAR Take 1 tablet (50 mg total) by mouth daily.   simvastatin 10 MG tablet Commonly known as: ZOCOR Take 1 tablet (10 mg total) by mouth at bedtime.        Review of Systems  Constitutional:  Negative for appetite change, chills, fatigue, fever and unexpected weight change.  HENT:  Negative for congestion, ear discharge, ear pain, hearing loss, nosebleeds, postnasal drip, rhinorrhea, sinus pressure, sinus pain, sneezing and sore throat.   Eyes:  Negative for pain, discharge, redness, itching and visual disturbance.  Respiratory:  Negative for cough, chest tightness, shortness of breath and wheezing.   Cardiovascular:  Negative for chest pain, palpitations and leg swelling.  Gastrointestinal:  Negative for abdominal distention, abdominal pain, blood in stool, constipation, diarrhea, nausea and vomiting.  Endocrine: Negative for cold  intolerance, heat intolerance, polydipsia, polyphagia and polyuria.  Genitourinary:  Negative for difficulty urinating, dysuria, flank pain, frequency and urgency.  Musculoskeletal:  Positive for arthralgias and gait problem. Negative for back pain, joint swelling, myalgias, neck pain and neck stiffness.       Right knee pain   Skin:  Negative for color change, pallor, rash and wound.  Neurological:  Negative for dizziness, weakness, light-headedness, numbness and headaches.    Immunization History  Administered Date(s) Administered   Fluad Quad(high Dose 65+) 11/08/2019   Influenza-Unspecified 10/31/2018, 10/16/2020   Moderna Sars-Covid-2 Vaccination 04/25/2019,  05/23/2019, 12/25/2019   Pneumococcal Conjugate-13 05/06/2015   Pneumococcal Polysaccharide-23 10/30/2013   Tdap 12/31/2013   Zoster Recombinat (Shingrix) 10/16/2020   Pertinent  Health Maintenance Due  Topic Date Due   INFLUENZA VACCINE  09/08/2021   DEXA SCAN  Completed      03/27/2020   10:45 AM 08/07/2020    9:06 AM 03/24/2021    9:34 AM 09/28/2021    2:43 PM 10/02/2021    2:02 PM  Fall Risk  Falls in the past year? 0 0 0 0 0  Was there an injury with Fall? 0 0 0 0 0  Fall Risk Category Calculator 0 0 0 0 0  Fall Risk Category Low Low Low Low Low  Patient Fall Risk Level Low fall risk Low fall risk Low fall risk  Low fall risk  Patient at Risk for Falls Due to  No Fall Risks No Fall Risks No Fall Risks No Fall Risks  Fall risk Follow up  Falls evaluation completed Falls evaluation completed Falls evaluation completed Falls evaluation completed   Functional Status Survey:    Vitals:   10/02/21 1345  BP: 110/70  Pulse: (!) 59  Resp: 18  Temp: 98.1 F (36.7 C)  SpO2: 98%  Weight: 201 lb (91.2 kg)  Height: 5' 4"  (1.626 m)   Body mass index is 34.5 kg/m. Physical Exam Vitals reviewed.  Constitutional:      General: She is not in acute distress.    Appearance: Normal appearance. She is normal weight. She  is not ill-appearing or diaphoretic.  Eyes:     General: No scleral icterus.       Right eye: No discharge.        Left eye: No discharge.     Conjunctiva/sclera: Conjunctivae normal.     Pupils: Pupils are equal, round, and reactive to light.  Cardiovascular:     Rate and Rhythm: Normal rate and regular rhythm.     Pulses: Normal pulses.     Heart sounds: Normal heart sounds. No murmur heard.    No friction rub. No gallop.  Pulmonary:     Effort: Pulmonary effort is normal. No respiratory distress.     Breath sounds: Normal breath sounds. No wheezing, rhonchi or rales.  Chest:     Chest wall: No tenderness.  Abdominal:     General: Bowel sounds are normal. There is no distension.     Palpations: Abdomen is soft. There is no mass.     Tenderness: There is no abdominal tenderness. There is no right CVA tenderness, left CVA tenderness or guarding.  Musculoskeletal:        General: No swelling or tenderness. Normal range of motion.     Cervical back: Normal range of motion. No rigidity or tenderness.     Right lower leg: No edema.     Left lower leg: No edema.  Lymphadenopathy:     Cervical: No cervical adenopathy.  Skin:    General: Skin is warm and dry.     Coloration: Skin is not pale.     Findings: No bruising, erythema, lesion or rash.  Neurological:     Mental Status: She is alert. Mental status is at baseline.     Motor: No weakness.     Coordination: Coordination normal.     Gait: Gait normal.  Psychiatric:        Mood and Affect: Mood normal.        Speech: Speech normal.  Behavior: Behavior normal.     Labs reviewed: Recent Labs    03/24/21 1210 09/25/21 0803  NA 139 140  K 5.3 5.4*  CL 105 111*  CO2 25 20  GLUCOSE 100* 115*  BUN 26* 37*  CREATININE 1.48* 1.85*  CALCIUM 9.9 9.6   Recent Labs    03/24/21 1210 09/25/21 0803  AST 17 22  ALT 13 24  BILITOT 0.4 0.3  PROT 7.1 6.9   Recent Labs    03/24/21 1210 09/25/21 0803  WBC 5.4 5.4   NEUTROABS 2,349 2,576  HGB 12.1 12.1  HCT 36.3 36.7  MCV 93.8 95.8  PLT 322 272   Lab Results  Component Value Date   TSH 1.17 03/24/2021   Lab Results  Component Value Date   HGBA1C 5.9 (H) 09/25/2021   Lab Results  Component Value Date   CHOL 152 09/25/2021   HDL 68 09/25/2021   LDLCALC 70 09/25/2021   TRIG 55 09/25/2021   CHOLHDL 2.2 09/25/2021    Significant Diagnostic Results in last 30 days:  No results found.  Assessment/Plan 1. Essential hypertension Blood pressure well controlled Continue on losartan, hydralazine and amlodipine - TSH; Future - CBC with Differential/Platelet; Future  2. Type 2 diabetes mellitus with stage 4 chronic kidney disease, without long-term current use of insulin (HCC) Lab Results  Component Value Date   HGBA1C 5.9 (H) 09/25/2021  -Controlled -Off metformin due to worsening renal function -Continue on glipizide - continue on ASA and Statin for cardiac event prophylaxis - on ARB for renal protection  - BMP with eGFR([facility]) - Hemoglobin A1c; Future  3. Hyperlipidemia LDL goal <70 LDL at goal -Continue on simvastatin - Lipid panel; Future - COMPLETE METABOLIC PANEL WITH GFR; Future  4. Primary osteoarthritis involving multiple joints Worst on right knee -Continue over-the-counter analgesic  5. Candidiasis of skin Nystatin powder ineffective on breast /armpits /abdominal folds - nystatin cream (MYCOSTATIN); Apply 1 Application topically 2 (two) times daily.  Dispense: 30 g; Refill: 0  Family/ staff Communication: Reviewed plan of care with patient and daughter verbalized understanding  Labs/tests ordered: - CBC with Differential/Platelet - CMP with eGFR([facility]) - TSH - Hgb A1C - Lipid panel  Next Appointment : Return in about 6 months (around 04/04/2022) for medical mangement of chronic issues., Fasting labs in 6 months prior to visit.   Sandrea Hughs, NP

## 2021-10-03 LAB — BASIC METABOLIC PANEL WITH GFR
BUN/Creatinine Ratio: 21 (calc) (ref 6–22)
BUN: 35 mg/dL — ABNORMAL HIGH (ref 7–25)
CO2: 20 mmol/L (ref 20–32)
Calcium: 9.3 mg/dL (ref 8.6–10.4)
Chloride: 110 mmol/L (ref 98–110)
Creat: 1.66 mg/dL — ABNORMAL HIGH (ref 0.60–0.95)
Glucose, Bld: 111 mg/dL (ref 65–139)
Potassium: 4.4 mmol/L (ref 3.5–5.3)
Sodium: 140 mmol/L (ref 135–146)
eGFR: 30 mL/min/{1.73_m2} — ABNORMAL LOW (ref >=60)

## 2021-10-05 NOTE — Progress Notes (Signed)
Kidney function is better compared o the one 10 days ago.

## 2021-10-19 ENCOUNTER — Other Ambulatory Visit: Payer: Self-pay | Admitting: Family

## 2021-10-19 DIAGNOSIS — Z1231 Encounter for screening mammogram for malignant neoplasm of breast: Secondary | ICD-10-CM

## 2021-10-20 ENCOUNTER — Ambulatory Visit: Payer: Medicaid Other

## 2021-10-21 ENCOUNTER — Ambulatory Visit (INDEPENDENT_AMBULATORY_CARE_PROVIDER_SITE_OTHER): Payer: Medicaid Other | Admitting: Internal Medicine

## 2021-10-21 ENCOUNTER — Encounter: Payer: Self-pay | Admitting: Internal Medicine

## 2021-10-21 VITALS — BP 122/60 | HR 54 | Temp 98.0°F | Resp 19 | Ht 62.25 in | Wt 200.6 lb

## 2021-10-21 DIAGNOSIS — T7800XA Anaphylactic reaction due to unspecified food, initial encounter: Secondary | ICD-10-CM | POA: Diagnosis not present

## 2021-10-21 DIAGNOSIS — R058 Other specified cough: Secondary | ICD-10-CM | POA: Diagnosis not present

## 2021-10-21 DIAGNOSIS — J3089 Other allergic rhinitis: Secondary | ICD-10-CM

## 2021-10-21 DIAGNOSIS — Z880 Allergy status to penicillin: Secondary | ICD-10-CM

## 2021-10-21 DIAGNOSIS — J302 Other seasonal allergic rhinitis: Secondary | ICD-10-CM

## 2021-10-21 MED ORDER — EPINEPHRINE 0.3 MG/0.3ML IJ SOAJ: 0.3000 mg | INTRAMUSCULAR | 1 refills | Status: DC | PRN

## 2021-10-21 MED ORDER — IPRATROPIUM BROMIDE 0.06 % NA SOLN: 2.0000 | Freq: Four times a day (QID) | NASAL | 6 refills | Status: DC

## 2021-10-21 MED ORDER — AZELASTINE HCL 0.1 % NA SOLN: 1.0000 | Freq: Two times a day (BID) | NASAL | 6 refills | Status: DC

## 2021-10-21 NOTE — Progress Notes (Signed)
New Patient Note  RE: Michelle Rowe MRN: [id number] DOB: [birth year] Date of Office Visit: 10/21/2021  Consult requested by: Nickola Major* Primary care provider: Sandrea Hughs, NP  Chief Complaint: Allergy Testing (Pt is present today due to frequent morning and afternoon bath time dry cough. Occasional wheezing, runny nose.), Cough, and Wheezing  History of Present Illness: I had the pleasure of seeing Michelle Rowe for initial evaluation at the [facility] on 10/22/2021. She is a 81 y.o. female, who is referred here by Ngetich, [name] C, NP for the evaluation of allergic rhinitis and chronic cough.  History obtained from patient, daughter  and  chart review .  Chronic rhinitis: started first noticed 3 years ago when she relocated from [place], [place] due to progressive dementia  Symptoms include:  cough which happens in morning and afternoon when she is bathing, rhinorrhea, and post nasal drainage, itchy watery eyes  Occurs  randomly  Potential triggers: shower  Treatments tried: levoceririzine 5mg  daily, claritin 10mg  daily flonase 1 spray per nostril daily, mucinex as needed  Previous allergy testing:  unknown  History of reflux/heartburn: no History of chronic sinusitis or sinus surgery: no Nonallergic triggers:  Denies    She was on lisinopril which was discontinued on 01/2020 due to cough and switched to losartan   PCN Allergy:   She developed urticaria with amoxicillin (2022)   Shellfish Allergy: She ate shrimp and developed periorbital edema.  She does not have an epipen, She tolerates fin fish, but still avoids mollusks.    Assessment and Plan: [name] is a 81 y.o. female with: Seasonal and perennial allergic rhinitis - Plan: Allergy Test, Spirometry with Graph  Allergy with anaphylaxis due to food - Plan: Allergy Test  History of penicillin allergy  Other cough Plan: Patient Instructions  Allergic  Rhinitis: not well controlled  -  Testing today showed skin prick testing positive to grass pollen, weed pollen, tree pollen, mold; Intradermal testing positive to dust mite - Copy of test results provided.  - Avoidance measures provided. - Stop taking: xyzal and claritin (this can cause worsening dementia)  - Continue with: Flonase (fluticasone) two sprays per nostril daily - Start taking:  nasal ipatropium 1 spray per nostril up to 4 times a day as needed for runny nose and Astelin (azelastine) 2 sprays per nostril 1-2 times daily as needed - You can use an extra dose of the antihistamine, if needed, for breakthrough symptoms.  - Consider nasal saline rinses 1-2 times daily to remove allergens from the nasal cavities as well as help with mucous clearance (this is especially helpful to do before the nasal sprays are given) - Consider allergy shots as a means of long-term control and can reduce lifetime use of medications  - Allergy shots "re-train" and "reset" the immune system to ignore environmental allergens and decrease the resulting immune response to those allergens (sneezing, itchy watery eyes, runny nose, nasal congestion, etc).    - Allergy shots improve symptoms in 75-85%  - Allergy shots are the only potential permanent and disease modifying option  - We can discuss more at the next appointment if the medications are not working for you.   Penicillin Allergy:  - Given recent reaction recommend continue avoidance of penicillin antitbiotics at this time   Shellfish Allergy:  - today's skin testing was negative to shellfish and mollusks - please strictly avoid shellfish and mollusks, if you are interested in reintroducing these foods  we need to get blood work and follow-up with a food challenge - okay to resume eating all finned fish such as tuna, salmon etc. - for SKIN only reaction, okay to take Benadryl 1 capsules every 6 hours - for SKIN + ANY additional symptoms, OR IF concern for LIFE THREATENING reaction =  Epipen Autoinjector EpiPen 0.3 mg. - If using Epinephrine autoinjector, call 911 - A food allergy action plan has been provided and discussed. - Medic Alert identification is recommended.   Follow up: 4 weeks to assess response to medications   Thank you so much for letting me partake in your care today.  Don't hesitate to reach out if you have any additional concerns!  Roney Marion, MD  [facility] [place] [place]  Reducing Pollen Exposure  The American Academy of Allergy, Asthma and Immunology suggests the following steps to reduce your exposure to pollen during allergy seasons.    Do not hang sheets or clothing out to dry; pollen may collect on these items. Do not mow lawns or spend time around freshly cut grass; mowing stirs up pollen. Keep windows closed at night.  Keep car windows closed while driving. Minimize morning activities outdoors, a time when pollen counts are usually at their highest. Stay indoors as much as possible when pollen counts or humidity is high and on windy days when pollen tends to remain in the air longer. Use air conditioning when possible.  Many air conditioners have filters that trap the pollen spores. Use a HEPA room air filter to remove pollen form the indoor air you breathe.  Control of Mold Allergen   Mold and fungi can grow on a variety of surfaces provided certain temperature and moisture conditions exist.  Outdoor molds grow on plants, decaying vegetation and soil.  The major outdoor mold, Alternaria and Cladosporium, are found in very high numbers during hot and dry conditions.  Generally, a late Summer - Fall peak is seen for common outdoor fungal spores.  Rain will temporarily lower outdoor mold spore count, but counts rise rapidly when the rainy period ends.  The most important indoor molds are Aspergillus and Penicillium.  Dark, humid and poorly ventilated basements are ideal sites for mold growth.  The next most common sites of  mold growth are the bathroom and the kitchen.  Outdoor (Seasonal) Mold Control  Positive outdoor molds via skin testing: Epicoccum  Use air conditioning and keep windows closed Avoid exposure to decaying vegetation. Avoid leaf raking. Avoid grain handling. Consider wearing a face mask if working in moldy areas.   DUST MITE AVOIDANCE MEASURES:  There are three main measures that need and can be taken to avoid house dust mites:  Reduce accumulation of dust in general -reduce furniture, clothing, carpeting, books, stuffed animals, especially in bedroom  Separate yourself from the dust -use pillow and mattress encasements (can be found at stores such as [employer], and [employer] or online) -avoid direct exposure to air condition flow -use a HEPA filter device, especially in the bedroom; you can also use a HEPA filter vacuum cleaner -wipe dust with a moist towel instead of a dry towel or broom when cleaning  Decrease mites and/or their secretions -wash clothing and linen and stuffed animals at highest temperature possible, at least every 2 weeks -stuffed animals can also be placed in a bag and put in a freezer overnight  Despite the above measures, it is impossible to eliminate dust mites or their allergen completely from  your home.  With the above measures the burden of mites in your home can be diminished, with the goal of minimizing your allergic symptoms.  Success will be reached only when implementing and using all means together.      No follow-ups on file.  Meds ordered this encounter  Medications   EPINEPHrine (EPIPEN 2-PAK) 0.3 mg/0.3 mL IJ SOAJ injection    Sig: Inject 0.3 mg into the muscle as needed for anaphylaxis.    Dispense:  1 each    Refill:  1   ipratropium (ATROVENT) 0.06 % nasal spray    Sig: Place 2 sprays into both nostrils 4 (four) times daily.    Dispense:  15 mL    Refill:  6   azelastine (ASTELIN) 0.1 % nasal spray    Sig: Place 1 spray into both  nostrils 2 (two) times daily. Use in each nostril as directed    Dispense:  30 mL    Refill:  6   Lab Orders  No laboratory test(s) ordered today    Other allergy screening: Asthma: no Rhino conjunctivitis: yes Food allergy: yes Medication allergy: yes Hymenoptera allergy: no Urticaria: no Eczema:no History of recurrent infections suggestive of immunodeficency: no  Diagnostics: Spirometry:  Tracings reviewed. Her effort: Poor effort, data can not be interpreted. FVC: 2.25 L FEV1: 0.99 L, 57% predicted FEV1/FVC ratio: 44% Interpretation: Spirometry uninterpretable due to technique.  Please see scanned spirometry results for details.  Skin Testing: Environmental allergy panel and select foods. - Testing today showed skin prick testing positive to grass pollen, weed pollen, tree pollen, mold; Intradermal testing positive to dust mite Results interpreted by myself and discussed with patient/family.  Airborne Adult Perc - 10/21/21 1507     Time Antigen Placed 1507    Allergen Manufacturer Lavella Hammock    Location Back    Number of Test 59    Panel 1 Select    1. Control-Buffer 50% Glycerol Negative    2. Control-Histamine 1 mg/ml 4+    3. Albumin saline Negative    4. [place] Negative    5. [place] Negative    6. Johnson Negative    7. [place] Blue 3+    8. Meadow Fescue 3+    9. Perennial Rye Negative    10. Sweet Vernal 3+    11. Timothy 4+    12. Cocklebur Negative    13. Burweed Marshelder Negative    14. Ragweed, short Negative    15. Ragweed, Giant Negative    16. Plantain,  English 3+    17. Lamb's Quarters 3+    18. Sheep Sorrell Negative    19. Rough Pigweed Negative    20. Marsh Elder, Rough Negative    21. Mugwort, Common 3+    22. Ash mix Negative    23. Birch mix Negative    24. Beech American 3+    25. Box, Elder Negative    26. Cedar, red Negative    27. Cottonwood, Eastern 3+    28. Elm mix Negative    29. Hickory Negative    30. Maple mix Negative     31. Oak, [place] mix 3+    32. Pecan Pollen Negative    33. Pine mix Negative    34. Sycamore Eastern Negative    35. [place], Black Pollen Negative    36. Alternaria alternata Negative    37. Cladosporium Herbarum Negative    38. Aspergillus mix Negative    39. Penicillium  mix Negative    40. Bipolaris sorokiniana (Helminthosporium) Negative    41. Drechslera spicifera (Curvularia) Negative    42. Mucor plumbeus Negative    43. Fusarium moniliforme Negative    44. Aureobasidium pullulans (pullulara) Negative    45. Rhizopus oryzae Negative    46. Botrytis cinera Negative    47. Epicoccum nigrum 3+    48. Phoma betae Negative    49. Candida Albicans Negative    50. Trichophyton mentagrophytes Negative    51. Mite, D Farinae  5,000 AU/ml Negative    52. Mite, D Pteronyssinus  5,000 AU/ml Negative    53. Cat Hair 10,000 BAU/ml Negative    54.  Dog Epithelia Negative    55. Mixed Feathers Negative    56. Horse Epithelia Negative    57. Cockroach, German Negative    58. Mouse Negative    59. Tobacco Leaf Negative             Intradermal - 10/21/21 1657     Time Antigen Placed 1657    Allergen Manufacturer Lavella Hammock    Location Arm    Number of Test 10    Control Negative    Ragweed mix Negative    Mold 1 Negative    Mold 2 Negative    Mold 3 Negative    Mold 4 Negative    Cat Negative    Dog Negative    Cockroach Negative    Mite mix 4+             Food Adult Perc - 10/21/21 1500     Time Antigen Placed 1507    Allergen Manufacturer Lavella Hammock    Location Back    Number of allergen test 5    25. Shrimp Negative    26. Crab Negative    27. Lobster Negative    28. Oyster Negative    29. Scallops Negative             Past Medical History: Patient Active Problem List   Diagnosis Date Noted   Bilateral primary osteoarthritis of knee 23/55/7322   Diastolic dysfunction without heart failure 11/08/2019   Essential hypertension 11/08/2019   Cough due to  ACE inhibitor 11/08/2019   Past Medical History:  Diagnosis Date   Anterolisthesis    Grade 1 of L3 on L4 on x-ray 07/18/19   Aortic regurgitation    Seen on 2D echo 07/07/16   Cataract    CKD (chronic kidney disease), stage III (HCC)    DDD (degenerative disc disease), lumbar    Diabetes mellitus without complication (HCC)    Diastolic CHF (HCC)    Grade 2/4 with LVEF of 02-54%   Diastolic dysfunction    Grade 2/4 with LVEF of 60-65% and LVH   Gout    HLD (hyperlipidemia)    Hyperparathyroidism (HCC)    Hypertension    Lumbar facet arthropathy    Memory impairment    Mitral valve regurgitation    Seen on 2D echo 07/07/16   OA (osteoarthritis)    OSA (obstructive sleep apnea)    Osteoarthritis    Polycythemia    Shingles    Sleep apnea    Thrombocytopenia (HCC)    Vitamin D deficiency    Past Surgical History: Past Surgical History:  Procedure Laterality Date   LAPAROSCOPIC HYSTERECTOMY     Medication List:  Current Outpatient Medications  Medication Sig Dispense Refill   allopurinol (ZYLOPRIM) 100 MG tablet Take 1 tablet by  mouth once daily 30 tablet 3   amLODipine (NORVASC) 10 MG tablet Take 1 tablet (10 mg total) by mouth daily. 30 tablet 3   aspirin 81 MG chewable tablet Chew 1 tablet (81 mg total) by mouth daily. 30 tablet 11   azelastine (ASTELIN) 0.1 % nasal spray Place 1 spray into both nostrils 2 (two) times daily. Use in each nostril as directed 30 mL 6   EPINEPHrine (EPIPEN 2-PAK) 0.3 mg/0.3 mL IJ SOAJ injection Inject 0.3 mg into the muscle as needed for anaphylaxis. 1 each 1   fluticasone (FLONASE) 50 MCG/ACT nasal spray USE 1 SPRAY(S) IN EACH NOSTRIL TWICE DAILY AS NEEDED FOR ALLERGIES 16 g 11   glipiZIDE (GLUCOTROL) 5 MG tablet Take 1 tablet (5 mg total) by mouth daily. 30 tablet 3   glucose blood test strip 1 each by Other route as needed for other. Use as instructed 100 each 3   guaiFENesin (MUCINEX) 600 MG 12 hr tablet Take 1 tablet (600 mg total) by  mouth 2 (two) times daily as needed. 60 tablet 0   hydrALAZINE (APRESOLINE) 25 MG tablet Take 1 tablet (25 mg total) by mouth 3 (three) times daily. 90 tablet 3   ipratropium (ATROVENT) 0.06 % nasal spray Place 2 sprays into both nostrils 4 (four) times daily. 15 mL 6   Lancets Ultra Thin 30G MISC Inject 1 each into the skin daily. 30 each 3   levocetirizine (XYZAL) 5 MG tablet Take 1 tablet (5 mg total) by mouth every evening. 30 tablet 3   loratadine (CLARITIN) 10 MG tablet Take 1 tablet (10 mg total) by mouth daily as needed for allergies. 30 tablet 3   LORazepam (ATIVAN) 0.5 MG tablet Take 1 tablet (0.5 mg total) by mouth 2 (two) times daily as needed for anxiety. 60 tablet 1   losartan (COZAAR) 50 MG tablet Take 1 tablet (50 mg total) by mouth daily. 30 tablet 3   nystatin cream (MYCOSTATIN) Apply 1 Application topically 2 (two) times daily. 30 g 0   simvastatin (ZOCOR) 10 MG tablet Take 1 tablet (10 mg total) by mouth at bedtime. 30 tablet 3   No current facility-administered medications for this visit.   Allergies: Allergies  Allergen Reactions   Augmentin [Amoxicillin-Pot Clavulanate] Rash    Critically high - Onset 07/13/2019   Amoxicillin     Did it involve swelling of the face/tongue/throat, SOB, or low BP? Y Did it involve sudden or severe rash/hives, skin peeling, or any reaction on the inside of your mouth or nose? Y (Rash, Swelling, SOB) Did you need to seek medical attention at a hospital or doctor's office? Y When did it last happen?  Yesterday     If all above answers are "NO", may proceed with cephalosporin use.    Shellfish Allergy    Social History: Social History   Socioeconomic History   Marital status: Single    Spouse name: Not on file   Number of children: 4   Years of education: Not on file   Highest education level: 9th grade  Occupational History    Comment: NA  Tobacco Use   Smoking status: Never   Smokeless tobacco: Never  Substance and Sexual  Activity   Alcohol use: Never   Drug use: Never   Sexual activity: Not Currently  Other Topics Concern   Not on file  Social History Narrative   Does not exercise.   No domestic violence.    10/09/19 Children -  Lives with daughter,Pamela.   Highest level of education - 9th grade   Has HCPOA      Has difficulty managing medications and finances. Has difficulty affording medications.   No difficulty with dressing, bathing, or preparing food.    Social Determinants of Health   Financial Resource Strain: Not on file  Food Insecurity: Not on file  Transportation Needs: Not on file  Physical Activity: Not on file  Stress: Not on file  Social Connections: Not on file   Lives in a single-family home that is 83 years old.  There are no roaches in the house and the bed is 2 feet off the floor.  There are no dust mite precautions on bed or pillows.  She does not have a job where she is exposed to fumes, chemicals or dust.  There is no HEPA filter in the home and home is not near an interstate industrial area. 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   Objective: BP 122/60   Pulse (!) 54   Temp 98 F (36.7 C) (Temporal)   Resp 19   Ht 5' 2.25" (1.581 m)   Wt 200 lb 9.6 oz (91 kg)   SpO2 99%   BMI 36.40 kg/m  Body mass index is 36.4 kg/m.  General Appearance:  Alert, cooperative, no distress, appears stated age  Head:  Normocephalic, without obvious abnormality, atraumatic  Eyes:  Conjunctiva clear,  EOM's intact  Nose: Nares normal, hypertrophic turbinates, no visible anterior polyps, and septum midline  Throat: Lips, tongue normal,  dentures, no tonsillar exudate, and + cobblestoning  Neck: Supple, symmetrical  Lungs:   clear to auscultation bilaterally, Respirations unlabored, no coughing  Heart:  regular rate and rhythm and no murmur, Appears well perfused  Extremities: No edema  Skin: Skin color, texture, turgor normal, no rashes or lesions on visualized portions of skin  Neurologic: No gross deficits   The plan was reviewed with the patient/family, and all questions/concerned were addressed.  It was my pleasure to see Vaughn today and participate in her care. Please feel free to contact me with any questions or concerns.  Sincerely,  Roney Marion, MD Allergy & Immunology  [facility] office: [phone] [facility] office: [phone]

## 2021-10-21 NOTE — Patient Instructions (Signed)
Allergic  Rhinitis: not well controlled  - Testing today showed skin prick testing positive to grass pollen, weed pollen, tree pollen, mold; Intradermal testing positive to dust mite - Copy of test results provided.  - Avoidance measures provided. - Stop taking: xyzal and claritin (this can cause worsening dementia)  - Continue with: Flonase (fluticasone) two sprays per nostril daily - Start taking:  nasal ipatropium 1 spray per nostril up to 4 times a day as needed for runny nose and Astelin (azelastine) 2 sprays per nostril 1-2 times daily as needed - You can use an extra dose of the antihistamine, if needed, for breakthrough symptoms.  - Consider nasal saline rinses 1-2 times daily to remove allergens from the nasal cavities as well as help with mucous clearance (this is especially helpful to do before the nasal sprays are given) - Consider allergy shots as a means of long-term control and can reduce lifetime use of medications  - Allergy shots "re-train" and "reset" the immune system to ignore environmental allergens and decrease the resulting immune response to those allergens (sneezing, itchy watery eyes, runny nose, nasal congestion, etc).    - Allergy shots improve symptoms in 75-85%  - Allergy shots are the only potential permanent and disease modifying option  - We can discuss more at the next appointment if the medications are not working for you.   Penicillin Allergy:  - Given recent reaction recommend continue avoidance of penicillin antitbiotics at this time   Shellfish Allergy:  - today's skin testing was negative to shellfish and mollusks - please strictly avoid shellfish and mollusks, if you are interested in reintroducing these foods we need to get blood work and follow-up with a food challenge - okay to resume eating all finned fish such as tuna, salmon etc. - for SKIN only reaction, okay to take Benadryl 1 capsules every 6 hours - for SKIN + ANY additional symptoms, OR IF  concern for LIFE THREATENING reaction = Epipen Autoinjector EpiPen 0.3 mg. - If using Epinephrine autoinjector, call 911 - A food allergy action plan has been provided and discussed. - Medic Alert identification is recommended.   Follow up: 4 weeks to assess response to medications   Thank you so much for letting me partake in your care today.  Don't hesitate to reach out if you have any additional concerns!  Roney Marion, MD  [facility] [place] [place]  Reducing Pollen Exposure  The American Academy of Allergy, Asthma and Immunology suggests the following steps to reduce your exposure to pollen during allergy seasons.    Do not hang sheets or clothing out to dry; pollen may collect on these items. Do not mow lawns or spend time around freshly cut grass; mowing stirs up pollen. Keep windows closed at night.  Keep car windows closed while driving. Minimize morning activities outdoors, a time when pollen counts are usually at their highest. Stay indoors as much as possible when pollen counts or humidity is high and on windy days when pollen tends to remain in the air longer. Use air conditioning when possible.  Many air conditioners have filters that trap the pollen spores. Use a HEPA room air filter to remove pollen form the indoor air you breathe.  Control of Mold Allergen   Mold and fungi can grow on a variety of surfaces provided certain temperature and moisture conditions exist.  Outdoor molds grow on plants, decaying vegetation and soil.  The major outdoor mold, Alternaria and Cladosporium,  are found in very high numbers during hot and dry conditions.  Generally, a late Summer - Fall peak is seen for common outdoor fungal spores.  Rain will temporarily lower outdoor mold spore count, but counts rise rapidly when the rainy period ends.  The most important indoor molds are Aspergillus and Penicillium.  Dark, humid and poorly ventilated basements are ideal sites for  mold growth.  The next most common sites of mold growth are the bathroom and the kitchen.  Outdoor (Seasonal) Mold Control  Positive outdoor molds via skin testing: Epicoccum  Use air conditioning and keep windows closed Avoid exposure to decaying vegetation. Avoid leaf raking. Avoid grain handling. Consider wearing a face mask if working in moldy areas.   DUST MITE AVOIDANCE MEASURES:  There are three main measures that need and can be taken to avoid house dust mites:  Reduce accumulation of dust in general -reduce furniture, clothing, carpeting, books, stuffed animals, especially in bedroom  Separate yourself from the dust -use pillow and mattress encasements (can be found at stores such as Bed, Bath, and Beyond or online) -avoid direct exposure to air condition flow -use a HEPA filter device, especially in the bedroom; you can also use a HEPA filter vacuum cleaner -wipe dust with a moist towel instead of a dry towel or broom when cleaning  Decrease mites and/or their secretions -wash clothing and linen and stuffed animals at highest temperature possible, at least every 2 weeks -stuffed animals can also be placed in a bag and put in a freezer overnight  Despite the above measures, it is impossible to eliminate dust mites or their allergen completely from your home.  With the above measures the burden of mites in your home can be diminished, with the goal of minimizing your allergic symptoms.  Success will be reached only when implementing and using all means together.

## 2021-11-05 ENCOUNTER — Other Ambulatory Visit: Payer: Self-pay | Admitting: Family

## 2021-11-05 DIAGNOSIS — F411 Generalized anxiety disorder: Secondary | ICD-10-CM

## 2021-11-05 NOTE — Telephone Encounter (Signed)
Patient has request refill on medication Lorazepam 0.5mg . Patient medication last refilled 07/14/2021. Patient doesn't has Non Opioid Contract on file. Medication pend and sent to PCP Ngetich, [name], NP for approval.

## 2021-11-06 ENCOUNTER — Ambulatory Visit: Payer: Medicaid Other

## 2021-11-20 IMAGING — MG MM DIGITAL SCREENING BILAT W/ TOMO AND CAD
8 of 14 series · 8 of 40 positions shown · non-contrast
Comparison: Previous exam(s).

CLINICAL DATA: Screening.

EXAM:
DIGITAL SCREENING BILATERAL MAMMOGRAM WITH TOMOSYNTHESIS AND CAD
TECHNIQUE: Bilateral screening digital craniocaudal and mediolateral oblique
mammograms were obtained. Bilateral screening digital breast
tomosynthesis was performed. The images were evaluated with
computer-aided detection.

[L CC synth-2D (1 of 2)]
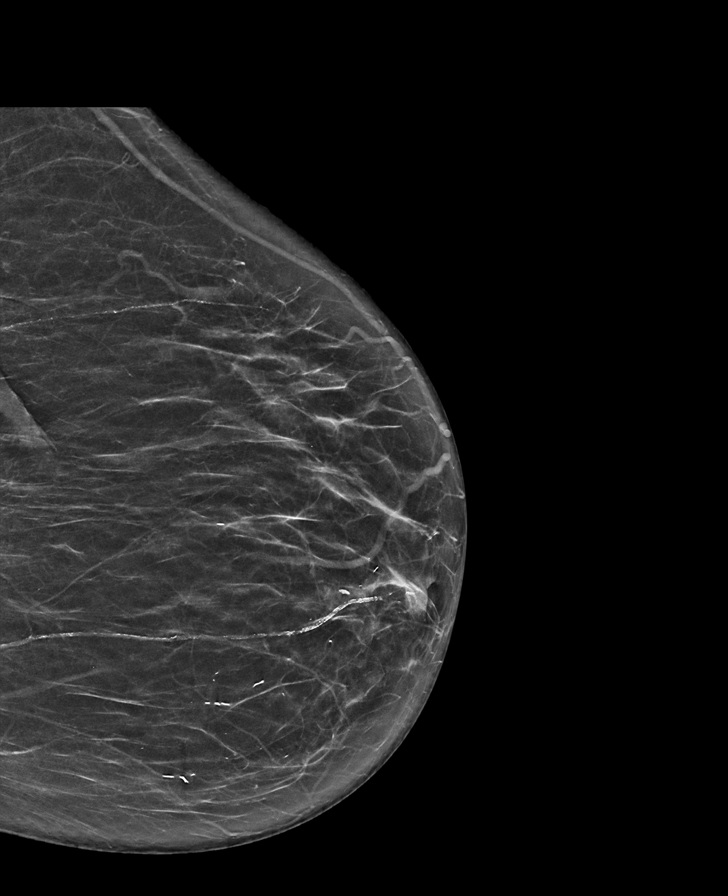

[R MLO synth-2D (1 of 2)]
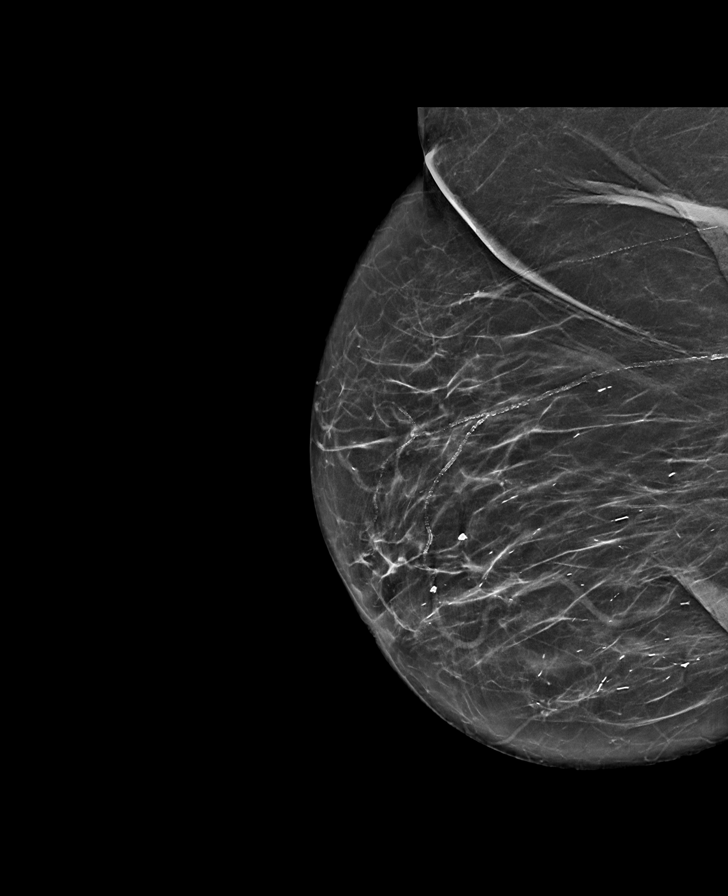

[R MLO synth-2D (2 of 2)]
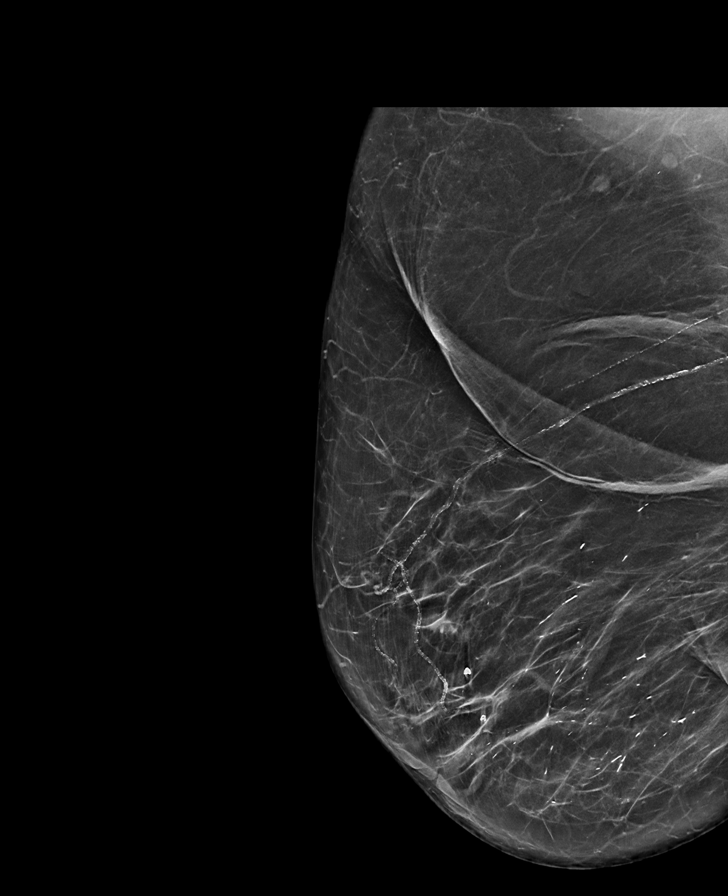

[L MLO synth-2D (1 of 2)]
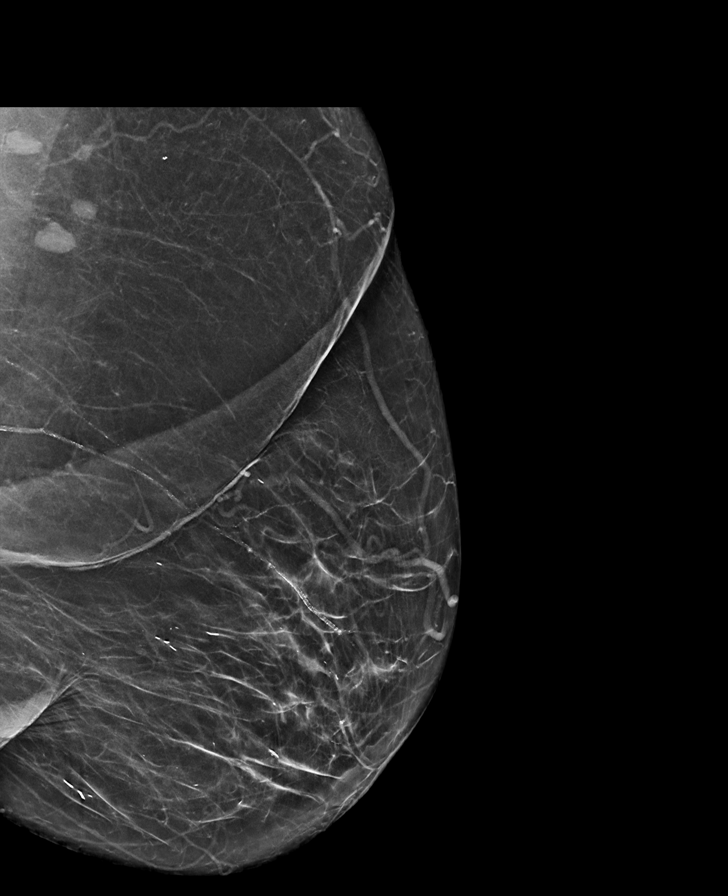

[L CC synth-2D (2 of 2)]
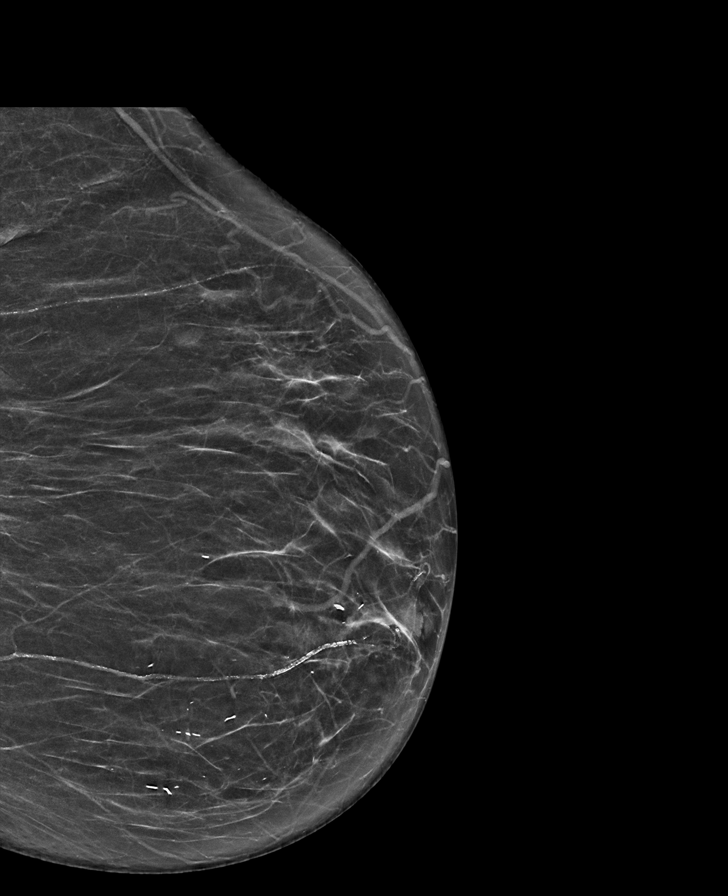

[L MLO synth-2D (2 of 2)]
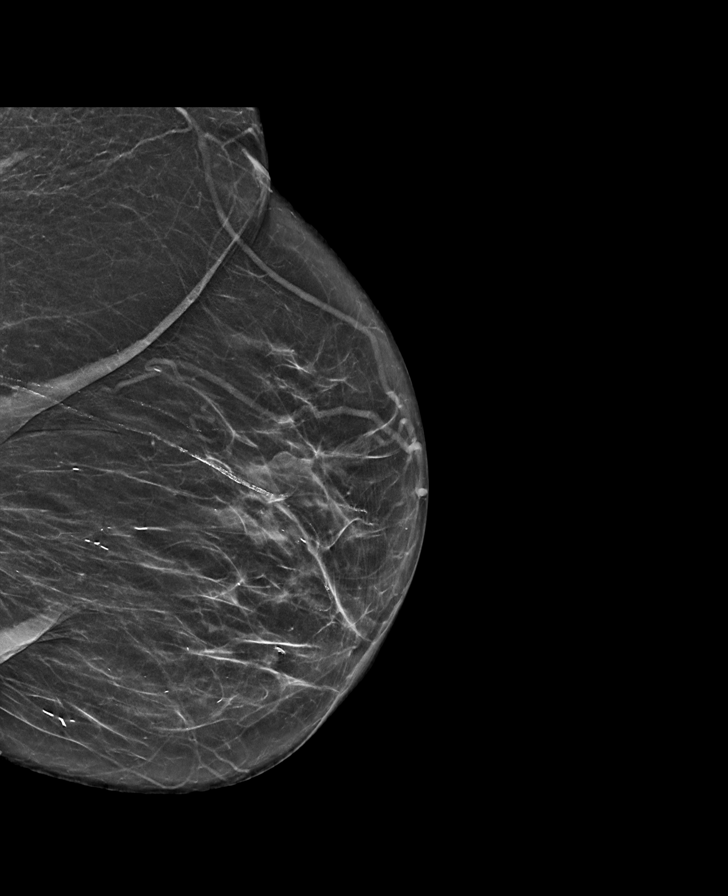

[R CC synth-2D]
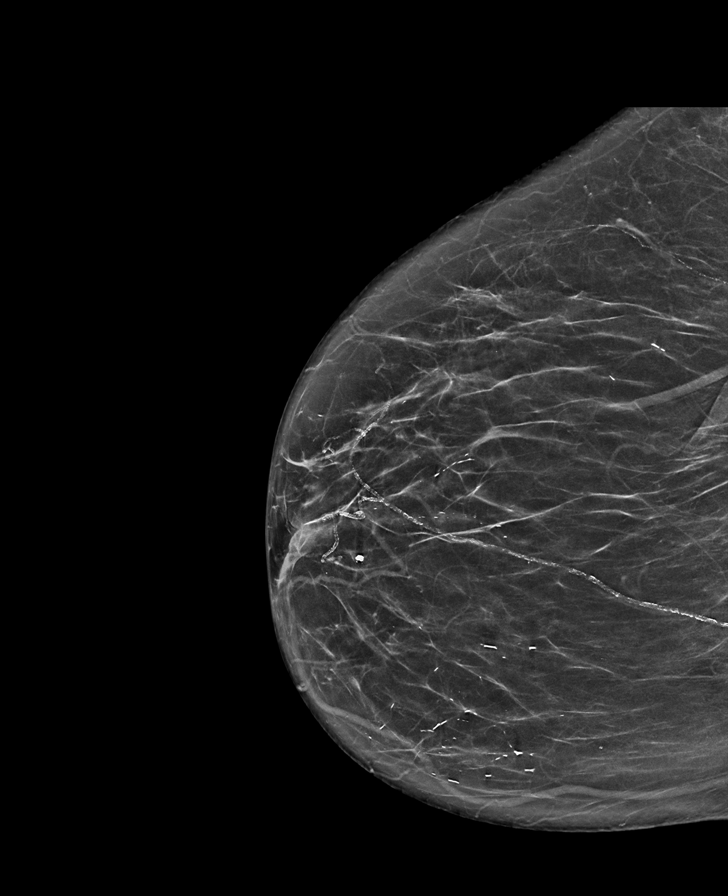

[L CC tomo · tomo slice 30/59.0]
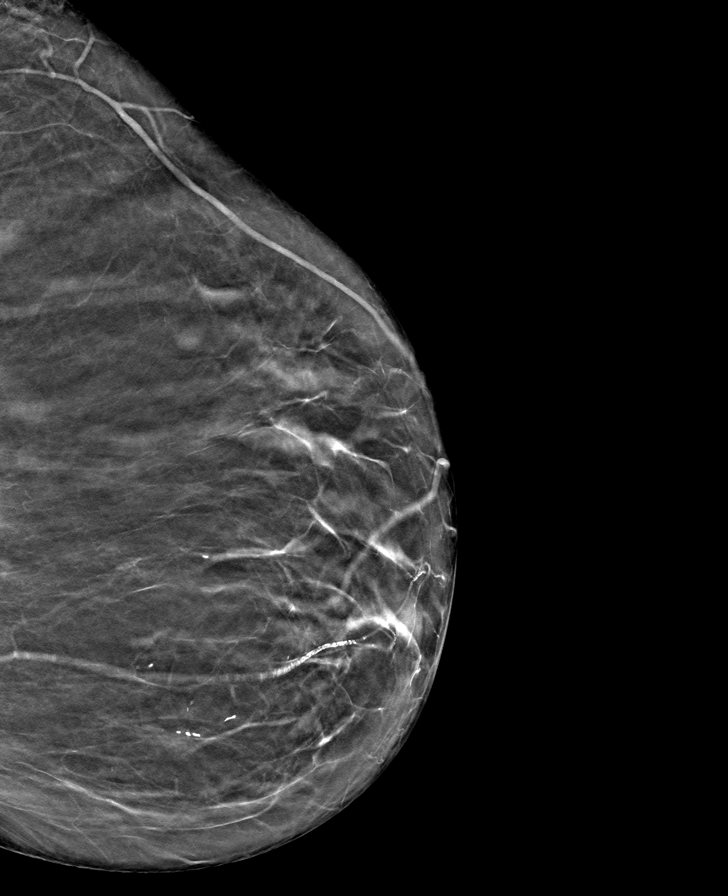

[8 of 40 positions shown; findings below may reference images not displayed]

ACR Breast Density Category b: There are scattered areas of
fibroglandular density.
FINDINGS: There are no findings suspicious for malignancy.
IMPRESSION: No mammographic evidence of malignancy. A result letter of this
screening mammogram will be mailed directly to the patient.

RECOMMENDATION:
Screening mammogram in one year. (Code:51-O-LD2)

BI-RADS CATEGORY  1: Negative.

## 2021-12-03 ENCOUNTER — Telehealth: Payer: Self-pay | Admitting: Family

## 2021-12-03 NOTE — Telephone Encounter (Signed)
Paperwork received from [place] RN requesting provider to attend patient's MDT meeting for [facility] Medicaid phone or video meeting on 10/27/20223 at 8: 30 Am.called and left Voicemail message to [place] at [phone] provider unable to attend meeting due to schedule.advised to update provider for any changes or call [place] office at [phone] for any questions.

## 2021-12-25 ENCOUNTER — Ambulatory Visit
Admission: RE | Admit: 2021-12-25 | Discharge: 2021-12-25 | Disposition: A | Discharge status: Home / Self Care | Payer: Medicaid Other | Source: Ambulatory Visit | Attending: Family | Admitting: Family

## 2021-12-25 DIAGNOSIS — Z1231 Encounter for screening mammogram for malignant neoplasm of breast: Secondary | ICD-10-CM

## 2022-01-22 ENCOUNTER — Other Ambulatory Visit: Payer: Self-pay | Admitting: Family

## 2022-01-22 DIAGNOSIS — F411 Generalized anxiety disorder: Secondary | ICD-10-CM

## 2022-01-22 NOTE — Telephone Encounter (Signed)
Medication refilled

## 2022-01-25 ENCOUNTER — Other Ambulatory Visit: Payer: Self-pay | Admitting: Adult Health

## 2022-01-25 DIAGNOSIS — I10 Essential (primary) hypertension: Secondary | ICD-10-CM

## 2022-01-25 DIAGNOSIS — J302 Other seasonal allergic rhinitis: Secondary | ICD-10-CM

## 2022-01-25 DIAGNOSIS — M1A9XX0 Chronic gout, unspecified, without tophus (tophi): Secondary | ICD-10-CM

## 2022-02-12 ENCOUNTER — Telehealth: Payer: Self-pay | Admitting: *Deleted

## 2022-02-12 NOTE — Telephone Encounter (Signed)
Patient Caregiver dropped off form for [facility] [place] [facility] [place] to be filled out and faxed back to: Berdine Dance [phone]  [facility] [place] [address] [phone]  Placed form in [name]'s folder to review and sign.

## 2022-03-12 ENCOUNTER — Other Ambulatory Visit: Payer: Self-pay | Admitting: Family

## 2022-03-12 DIAGNOSIS — F411 Generalized anxiety disorder: Secondary | ICD-10-CM

## 2022-03-12 DIAGNOSIS — M1A9XX0 Chronic gout, unspecified, without tophus (tophi): Secondary | ICD-10-CM

## 2022-03-12 DIAGNOSIS — I10 Essential (primary) hypertension: Secondary | ICD-10-CM

## 2022-03-12 DIAGNOSIS — E1122 Type 2 diabetes mellitus with diabetic chronic kidney disease: Secondary | ICD-10-CM

## 2022-03-12 NOTE — Telephone Encounter (Signed)
Patient has request refill on medication Lorazepam 0.5mg . Patient medication last refilled 01/22/2022. Patient has No Contract on file. Patient has no upcoming appointments. Medication Allopurinol also send for refill. Medication was refilled 01/25/2022 with 30 tablets and no refills. Medications pend and sent to Sherrie Mustache, NP due to PCP Ngetich, [name], NP being out of office.

## 2022-03-30 ENCOUNTER — Ambulatory Visit: Payer: Medicaid Other | Admitting: Orthopaedic Surgery

## 2022-04-14 ENCOUNTER — Ambulatory Visit (INDEPENDENT_AMBULATORY_CARE_PROVIDER_SITE_OTHER): Payer: Medicaid Other | Admitting: Physician Assistant

## 2022-04-14 ENCOUNTER — Other Ambulatory Visit: Payer: Self-pay | Admitting: Family

## 2022-04-14 ENCOUNTER — Ambulatory Visit (INDEPENDENT_AMBULATORY_CARE_PROVIDER_SITE_OTHER): Payer: Medicaid Other | Admitting: Family

## 2022-04-14 ENCOUNTER — Telehealth: Payer: Self-pay

## 2022-04-14 ENCOUNTER — Ambulatory Visit
Admission: RE | Admit: 2022-04-14 | Discharge: 2022-04-14 | Disposition: A | Discharge status: Home / Self Care | Payer: Medicaid Other | Source: Ambulatory Visit | Attending: Family | Admitting: Family

## 2022-04-14 ENCOUNTER — Encounter: Payer: Medicaid Other | Admitting: Family

## 2022-04-14 ENCOUNTER — Encounter: Payer: Self-pay | Admitting: Family

## 2022-04-14 ENCOUNTER — Encounter: Payer: Self-pay | Admitting: Physician Assistant

## 2022-04-14 VITALS — BP 127/87 | HR 52 | Temp 97.3°F | Resp 18 | Ht 62.5 in | Wt 207.2 lb

## 2022-04-14 DIAGNOSIS — M1711 Unilateral primary osteoarthritis, right knee: Secondary | ICD-10-CM | POA: Diagnosis not present

## 2022-04-14 DIAGNOSIS — M170 Bilateral primary osteoarthritis of knee: Secondary | ICD-10-CM

## 2022-04-14 DIAGNOSIS — G8929 Other chronic pain: Secondary | ICD-10-CM

## 2022-04-14 DIAGNOSIS — N184 Chronic kidney disease, stage 4 (severe): Secondary | ICD-10-CM

## 2022-04-14 DIAGNOSIS — B372 Candidiasis of skin and nail: Secondary | ICD-10-CM

## 2022-04-14 DIAGNOSIS — E1122 Type 2 diabetes mellitus with diabetic chronic kidney disease: Secondary | ICD-10-CM

## 2022-04-14 DIAGNOSIS — M25561 Pain in right knee: Secondary | ICD-10-CM

## 2022-04-14 DIAGNOSIS — I5032 Chronic diastolic (congestive) heart failure: Secondary | ICD-10-CM

## 2022-04-14 DIAGNOSIS — M25511 Pain in right shoulder: Secondary | ICD-10-CM

## 2022-04-14 DIAGNOSIS — M1A9XX0 Chronic gout, unspecified, without tophus (tophi): Secondary | ICD-10-CM

## 2022-04-14 DIAGNOSIS — E785 Hyperlipidemia, unspecified: Secondary | ICD-10-CM

## 2022-04-14 DIAGNOSIS — G301 Alzheimer's disease with late onset: Secondary | ICD-10-CM

## 2022-04-14 DIAGNOSIS — I10 Essential (primary) hypertension: Secondary | ICD-10-CM

## 2022-04-14 DIAGNOSIS — R32 Unspecified urinary incontinence: Secondary | ICD-10-CM

## 2022-04-14 DIAGNOSIS — J302 Other seasonal allergic rhinitis: Secondary | ICD-10-CM

## 2022-04-14 DIAGNOSIS — F411 Generalized anxiety disorder: Secondary | ICD-10-CM

## 2022-04-14 DIAGNOSIS — F0280 Dementia in other diseases classified elsewhere without behavioral disturbance: Secondary | ICD-10-CM

## 2022-04-14 MED ORDER — IPRATROPIUM BROMIDE 0.06 % NA SOLN: 2.0000 | Freq: Four times a day (QID) | NASAL | 6 refills | Status: DC

## 2022-04-14 MED ORDER — GLUCOSE BLOOD VI STRP: 1.0000 | ORAL_STRIP | 3 refills | Status: DC | PRN

## 2022-04-14 MED ORDER — LIDOCAINE HCL 1 % IJ SOLN
2.0000 mL | INTRAMUSCULAR | Status: AC | PRN
  Administered 2022-04-14: 2 mL

## 2022-04-14 MED ORDER — DIVALPROEX SODIUM 125 MG PO DR TAB: 125.0000 mg | DELAYED_RELEASE_TABLET | Freq: Every day | ORAL | 3 refills | Status: DC

## 2022-04-14 MED ORDER — LORAZEPAM 0.5 MG PO TABS: 0.5000 mg | ORAL_TABLET | Freq: Two times a day (BID) | ORAL | 0 refills | Status: DC | PRN

## 2022-04-14 MED ORDER — AMLODIPINE BESYLATE 10 MG PO TABS: 10.0000 mg | ORAL_TABLET | Freq: Every day | ORAL | 3 refills | Status: DC

## 2022-04-14 MED ORDER — HYDRALAZINE HCL 25 MG PO TABS: 25.0000 mg | ORAL_TABLET | Freq: Three times a day (TID) | ORAL | 1 refills | Status: DC

## 2022-04-14 MED ORDER — NYSTATIN 100000 UNIT/GM EX CREA: 1.0000 | TOPICAL_CREAM | Freq: Two times a day (BID) | CUTANEOUS | 0 refills | Status: DC

## 2022-04-14 MED ORDER — ALLOPURINOL 100 MG PO TABS: 100.0000 mg | ORAL_TABLET | Freq: Every day | ORAL | 0 refills | Status: DC

## 2022-04-14 MED ORDER — FLUTICASONE PROPIONATE 50 MCG/ACT NA SUSP: NASAL | 11 refills | Status: DC

## 2022-04-14 MED ORDER — METHYLPREDNISOLONE ACETATE 40 MG/ML IJ SUSP
80.0000 mg | INTRAMUSCULAR | Status: AC | PRN
  Administered 2022-04-14: 80 mg via INTRA_ARTICULAR

## 2022-04-14 MED ORDER — BLOOD GLUCOSE MONITOR KIT: PACK | 5 refills | Status: DC

## 2022-04-14 MED ORDER — BUPIVACAINE HCL 0.25 % IJ SOLN
2.0000 mL | INTRAMUSCULAR | Status: AC | PRN
  Administered 2022-04-14: 2 mL via INTRA_ARTICULAR

## 2022-04-14 MED ORDER — SIMVASTATIN 10 MG PO TABS: 10.0000 mg | ORAL_TABLET | Freq: Every day | ORAL | 3 refills | Status: DC

## 2022-04-14 MED ORDER — LOSARTAN POTASSIUM 50 MG PO TABS: 50.0000 mg | ORAL_TABLET | Freq: Every day | ORAL | 3 refills | Status: DC

## 2022-04-14 MED ORDER — LANCETS ULTRA THIN 30G MISC: 1.0000 | Freq: Every day | 3 refills | Status: DC

## 2022-04-14 NOTE — Telephone Encounter (Signed)
Please precert for right knee gel injection.  Patient of [facility]. Thanks!

## 2022-04-14 NOTE — Telephone Encounter (Signed)
Patient and daughter were in our clinic this morning to see Ngetich, [name], NP and I call the patient daughter to see what was the name of the meter and the test supplies in order from the pharmacy. I left a voicemail to have the daughter to call the office to have the information for the diabetic meter and testing.   Message Routed back to Ngetich, [name], NP

## 2022-04-14 NOTE — Patient Instructions (Signed)
-   Please get Right shoulder X-ray at [place] [facility] then will call you with results.

## 2022-04-14 NOTE — Progress Notes (Signed)
Office Visit Note   Patient: Michelle Rowe           Date of Birth: 11/27/1940           MRN: [id number] Visit Date: 04/14/2022              Requested by: Sandrea Hughs, NP [address] [place],  [place] [address] PCP: Sandrea Hughs, NP  Chief Complaint  Patient presents with  . Right Knee - Pain      HPI: Michelle Rowe is a pleasant 82 year old woman who is accompanied by her daughter.  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  Periodically she comes in for knee injections.  She comes in requesting an injection into her right knee today.  No injury  Assessment & Plan: Visit Diagnoses: Osteoarthritis right knee  Plan: Will go forward with a steroid injection today.  Patient has not really a good candidate for knee replacement surgery.  We will authorize for viscosupplementation though given the amount of arthritis she has I am not sure how helpful this is going to be.  Will follow-up once this is authorized This patient is diagnosed with osteoarthritis of the knee(s).    Radiographs show evidence of joint space narrowing, osteophytes, subchondral sclerosis and/or subchondral cysts.  This patient has knee pain which interferes with functional and activities of daily living.    This patient has experienced inadequate response, adverse effects and/or intolerance with conservative treatments such as acetaminophen, NSAIDS, topical creams, physical therapy or regular exercise, knee bracing and/or weight loss.   This patient has experienced inadequate response or has a contraindication to intra articular steroid injections for at least 3 months.   This patient is not scheduled to have a total knee replacement within 6 months of starting treatment with viscosupplementation.  Just wrote it down on a piece of paper you wrote on the prescription for either like [employer] sneakers and like orthotics and I gave that to them a little sleepy wanting  Follow-Up  Instructions: When viscosupplementation authorized  Ortho Exam  Patient is alert, oriented, no adenopathy, well-dressed, normal affect, normal respiratory effort. Examination of her knees she has no effusion no redness mild soft tissue swelling no warmth.  Compartments are soft and nontender she is neurovascularly intact  Imaging: No results found. No images are attached to the encounter.  Labs: Lab Results  Component Value Date   HGBA1C 5.9 (H) 09/25/2021   HGBA1C 6.1 (H) 03/24/2021   HGBA1C 6.1 (H) 08/15/2020   LABURIC 3.0 10/02/2019     Lab Results  Component Value Date   ALBUMIN 4.3 12/06/2019   ALBUMIN 3.7 07/14/2019   ALBUMIN 4.4 03/24/2018    No results found for: "MG" Lab Results  Component Value Date   VD25OH 35.46 12/06/2019    No results found for: "PREALBUMIN"    Latest Ref Rng & Units 09/25/2021    8:03 AM 03/24/2021   12:10 PM 08/15/2020    8:39 AM  CBC EXTENDED  WBC 3.8 - 10.8 Thousand/uL 5.4  5.4  6.4   RBC 3.80 - 5.10 Million/uL 3.83  3.87  3.98   Hemoglobin 11.7 - 15.5 g/dL 12.1  12.1  12.3   HCT 35.0 - 45.0 % 36.7  36.3  37.5   Platelets 140 - 400 Thousand/uL 272  322  368   NEUT# 1,500 - 7,800 cells/uL 2,576  2,349  3,603   Lymph# 850 - 3,900 cells/uL 2,311  2,592  2,470      There is no height or weight on file to calculate BMI.  Orders:  No orders of the defined types were placed in this encounter.  No orders of the defined types were placed in this encounter.    Procedures: Large Joint Inj: R knee on 04/14/2022 2:21 PM Indications: pain and diagnostic evaluation Details: 25 G 1.5 in needle, anteromedial approach  Arthrogram: No  Medications: 80 mg methylPREDNISolone acetate 40 MG/ML; 2 mL lidocaine 1 %; 2 mL bupivacaine 0.25 % Outcome: tolerated well, no immediate complications Procedure, treatment alternatives, risks and benefits explained, specific risks discussed. Consent was given by the patient.    Clinical Data: No  additional findings.  ROS:  All other systems negative, except as noted in the HPI. Review of Systems  Objective: Vital Signs: There were no vitals taken for this visit.  Specialty Comments:  No specialty comments available.  PMFS History: Patient Active Problem List   Diagnosis Date Noted  . Bilateral primary osteoarthritis of knee 06/18/2021  . Diastolic dysfunction without heart failure 11/08/2019  . Essential hypertension 11/08/2019  . Cough due to ACE inhibitor 11/08/2019   Past Medical History:  Diagnosis Date  . Anterolisthesis    Grade 1 of L3 on L4 on x-ray 07/18/19  . Aortic regurgitation    Seen on 2D echo 07/07/16  . Cataract   . CKD (chronic kidney disease), stage III ([place])   . DDD (degenerative disc disease), lumbar   . Diabetes mellitus without complication ([place])   . Diastolic CHF ([facility])    Grade 2/4 with LVEF of 60-65%  . Diastolic dysfunction    Grade 2/4 with LVEF of 60-65% and LVH  . Gout   . HLD (hyperlipidemia)   . Hyperparathyroidism ([place])   . Hypertension   . Lumbar facet arthropathy   . Memory impairment   . Mitral valve regurgitation    Seen on 2D echo 07/07/16  . OA (osteoarthritis)   . OSA (obstructive sleep apnea)   . Osteoarthritis   . Polycythemia   . Shingles   . Sleep apnea   . Thrombocytopenia ([place])   . Vitamin D deficiency     Family History  Problem Relation Age of Onset  . Diabetes Mother   . Hypertension Father   . Hypertension Brother   . Hypertension Sister   . Diabetes Sister     Past Surgical History:  Procedure Laterality Date  . LAPAROSCOPIC HYSTERECTOMY     Social History   Occupational History    Comment: NA  Tobacco Use  . Smoking status: Never  . Smokeless tobacco: Never  Substance and Sexual Activity  . Alcohol use: Never  . Drug use: Never  . Sexual activity: Not Currently

## 2022-04-14 NOTE — Telephone Encounter (Signed)
Patient need a new glucometer.Send Accu check with supplies.

## 2022-04-14 NOTE — Progress Notes (Signed)
Provider: Marlowe Sax FNP-C   Shalen Petrak, [name], NP  Patient Care Team: Soniya Ashraf, [name], NP as PCP - General (Family Medicine)  Extended Emergency Contact Information Primary Emergency Contact: Leisel, [place] Home Phone: [phone] Mobile Phone: [phone] Relation: Daughter Secondary Emergency Contact: [place], [place] Phone: [phone] Mobile Phone: [phone] Relation: Daughter  Code Status:  Full Code  Goals of care: Advanced Directive information    10/02/2021    2:03 PM  Advanced Directives  Does Patient Have a Medical Advance Directive? No  Would patient like information on creating a medical advance directive? No - Patient declined     Chief Complaint  Patient presents with   Medical Management of Chronic Issues    Six Months Follow up   Quality Metric Gaps    Needs to discuss Shingrix, Covid vaccine and Diabetic Kidney Evaluation.    HPI:  Pt is a 82 y.o. female seen today for 6 months follow up for medical management of chronic diseases.  Here with daughter who provides most of the HPI information due to patient's cognitive impairment.  Patient fidgeting throughout the visit with her nails. States patient sundowning has worsen.Has become combative and not sleeping well at night.  She let granddaughter's dog out when she was left alone in the living room and daughter had gone upstairs. She puts stuff in the toilet including her food.  Has fall x 3 times since last visit but none recently. Due for Shingrix and COVID-vaccine daughter went to take her to the pharmacy to get the vaccines.  Past Medical History:  Diagnosis Date   Anterolisthesis    Grade 1 of L3 on L4 on x-ray 07/18/19   Aortic regurgitation    Seen on 2D echo 07/07/16   Cataract    CKD (chronic kidney disease), stage III (HCC)    DDD (degenerative disc disease), lumbar    Diabetes mellitus without complication (HCC)    Diastolic CHF (HCC)    Grade 2/4 with LVEF of 123456    Diastolic dysfunction    Grade 2/4 with LVEF of 60-65% and LVH   Gout    HLD (hyperlipidemia)    Hyperparathyroidism (HCC)    Hypertension    Lumbar facet arthropathy    Memory impairment    Mitral valve regurgitation    Seen on 2D echo 07/07/16   OA (osteoarthritis)    OSA (obstructive sleep apnea)    Osteoarthritis    Polycythemia    Shingles    Sleep apnea    Thrombocytopenia (HCC)    Vitamin D deficiency    Past Surgical History:  Procedure Laterality Date   LAPAROSCOPIC HYSTERECTOMY      Allergies  Allergen Reactions   Augmentin [Amoxicillin-Pot Clavulanate] Rash    Critically high - Onset 07/13/2019   Amoxicillin     Did it involve swelling of the face/tongue/throat, SOB, or low BP? Y Did it involve sudden or severe rash/hives, skin peeling, or any reaction on the inside of your mouth or nose? Y (Rash, Swelling, SOB) Did you need to seek medical attention at a hospital or doctor's office? Y When did it last happen?  Yesterday     If all above answers are "NO", may proceed with cephalosporin use.    Shellfish Allergy     Allergies as of 04/14/2022       Reactions   Augmentin [amoxicillin-pot Clavulanate] Rash   Critically high - Onset 07/13/2019   Amoxicillin    Did it involve  swelling of the face/tongue/throat, SOB, or low BP? Y Did it involve sudden or severe rash/hives, skin peeling, or any reaction on the inside of your mouth or nose? Y (Rash, Swelling, SOB) Did you need to seek medical attention at a hospital or doctor's office? Y When did it last happen?  Yesterday     If all above answers are "NO", may proceed with cephalosporin use.   Shellfish Allergy         Medication List        Accurate as of April 14, 2022  8:08 PM. If you have any questions, ask your nurse or doctor.          STOP taking these medications    levocetirizine 5 MG tablet Commonly known as: XYZAL Stopped by: Sandrea Hughs, NP       TAKE these medications     allopurinol 100 MG tablet Commonly known as: ZYLOPRIM Take 1 tablet (100 mg total) by mouth daily.   amLODipine 10 MG tablet Commonly known as: NORVASC Take 1 tablet (10 mg total) by mouth daily.   aspirin 81 MG chewable tablet Chew 1 tablet (81 mg total) by mouth daily.   azelastine 0.1 % nasal spray Commonly known as: ASTELIN Place 1 spray into both nostrils 2 (two) times daily. Use in each nostril as directed   divalproex 125 MG DR tablet Commonly known as: DEPAKOTE Take 1 tablet (125 mg total) by mouth at bedtime. Started by: Sandrea Hughs, NP   EPINEPHrine 0.3 mg/0.3 mL Soaj injection Commonly known as: EpiPen 2-Pak Inject 0.3 mg into the muscle as needed for anaphylaxis.   fluticasone 50 MCG/ACT nasal spray Commonly known as: FLONASE USE 1 SPRAY(S) IN EACH NOSTRIL TWICE DAILY AS NEEDED FOR ALLERGIES   glipiZIDE 5 MG tablet Commonly known as: GLUCOTROL Take 1 tablet by mouth once daily   glucose blood test strip 1 each by Other route as needed for other. Use as instructed   guaiFENesin 600 MG 12 hr tablet Commonly known as: MUCINEX Take 1 tablet (600 mg total) by mouth 2 (two) times daily as needed.   hydrALAZINE 25 MG tablet Commonly known as: APRESOLINE Take 1 tablet (25 mg total) by mouth 3 (three) times daily.   ipratropium 0.06 % nasal spray Commonly known as: ATROVENT Place 2 sprays into both nostrils 4 (four) times daily.   Lancets Ultra Thin 30G Misc Inject 1 each into the skin daily.   loratadine 10 MG tablet Commonly known as: CLARITIN Take 1 tablet (10 mg total) by mouth daily as needed for allergies.   LORazepam 0.5 MG tablet Commonly known as: ATIVAN Take 1 tablet (0.5 mg total) by mouth 2 (two) times daily as needed. for anxiety   losartan 50 MG tablet Commonly known as: COZAAR Take 1 tablet (50 mg total) by mouth daily.   nystatin cream Commonly known as: MYCOSTATIN Apply 1 Application topically 2 (two) times daily.    simvastatin 10 MG tablet Commonly known as: ZOCOR Take 1 tablet (10 mg total) by mouth at bedtime.        Review of Systems  Unable to perform ROS: Dementia (Additional HPI information provided by patient's daughter present during visit)  Constitutional:  Negative for appetite change, chills, fatigue, fever and unexpected weight change.  HENT:  Negative for congestion, dental problem, ear discharge, ear pain, facial swelling, hearing loss, nosebleeds, postnasal drip, rhinorrhea, sinus pressure, sinus pain, sneezing, sore throat, tinnitus and trouble swallowing.  Eyes:  Negative for pain, discharge, redness, itching and visual disturbance.  Respiratory:  Negative for cough, chest tightness, shortness of breath and wheezing.   Cardiovascular:  Negative for chest pain, palpitations and leg swelling.  Gastrointestinal:  Negative for abdominal distention, abdominal pain, blood in stool, constipation, diarrhea, nausea and vomiting.  Endocrine: Negative for cold intolerance, heat intolerance, polydipsia, polyphagia and polyuria.  Genitourinary:  Negative for difficulty urinating, dysuria, flank pain, frequency and urgency.       Incontinent wears adult size pull-ups  Musculoskeletal:  Positive for arthralgias and gait problem. Negative for back pain, joint swelling, myalgias, neck pain and neck stiffness.       Right shoulder pain has worsen unable to raise her arm above her head  Skin:  Negative for color change, pallor, rash and wound.  Neurological:  Negative for dizziness, syncope, speech difficulty, weakness, light-headedness, numbness and headaches.  Hematological:  Does not bruise/bleed easily.  Psychiatric/Behavioral:  Positive for behavioral problems and sleep disturbance. Negative for agitation, confusion, hallucinations, self-injury and suicidal ideas. The patient is not nervous/anxious.        Worsening sundowning and combativeness    Immunization History  Administered Date(s)  Administered   Fluad Quad(high Dose 65+) 11/08/2019   Influenza, High Dose Seasonal PF 10/03/2021   Influenza-Unspecified 10/31/2018, 10/16/2020   Moderna Sars-Covid-2 Vaccination 04/25/2019, 05/23/2019, 12/25/2019   Pneumococcal Conjugate-13 05/06/2015   Pneumococcal Polysaccharide-23 10/30/2013   Tdap 12/31/2013   Zoster Recombinat (Shingrix) 10/16/2020   Pertinent  Health Maintenance Due  Topic Date Due   INFLUENZA VACCINE  Completed   DEXA SCAN  Completed      08/07/2020    9:06 AM 03/24/2021    9:34 AM 09/28/2021    2:43 PM 10/02/2021    2:02 PM 04/14/2022   10:15 AM  Fall Risk  Falls in the past year? 0 0 0 0 1  Was there an injury with Fall? 0 0 0 0 1  Fall Risk Category Calculator 0 0 0 0 3  Fall Risk Category (Retired) Low Low Low Low   (RETIRED) Patient Fall Risk Level Low fall risk Low fall risk  Low fall risk   Patient at Risk for Falls Due to No Fall Risks No Fall Risks No Fall Risks No Fall Risks History of fall(s)  Fall risk Follow up Falls evaluation completed Falls evaluation completed Falls evaluation completed Falls evaluation completed    Functional Status Survey:    Vitals:   04/14/22 1023  BP: 127/87  Pulse: (!) 52  Resp: 18  Temp: (!) 97.3 F (36.3 C)  SpO2: 98%  Weight: 207 lb 4 oz (94 kg)  Height: 5' 2.5" (1.588 m)   Body mass index is 37.3 kg/m. Physical Exam Vitals reviewed.  Constitutional:      General: She is not in acute distress.    Appearance: Normal appearance. She is obese. She is not ill-appearing or diaphoretic.  HENT:     Head: Normocephalic.     Right Ear: Tympanic membrane, ear canal and external ear normal. There is no impacted cerumen.     Left Ear: Tympanic membrane, ear canal and external ear normal. There is no impacted cerumen.     Nose: Nose normal. No congestion or rhinorrhea.     Mouth/Throat:     Mouth: Mucous membranes are moist.     Pharynx: Oropharynx is clear. No oropharyngeal exudate or posterior oropharyngeal  erythema.  Eyes:     General: No scleral icterus.  Right eye: No discharge.        Left eye: No discharge.     Extraocular Movements: Extraocular movements intact.     Conjunctiva/sclera: Conjunctivae normal.     Pupils: Pupils are equal, round, and reactive to light.  Neck:     Vascular: No carotid bruit.  Cardiovascular:     Rate and Rhythm: Normal rate and regular rhythm.     Pulses: Normal pulses.     Heart sounds: Normal heart sounds. No murmur heard.    No friction rub. No gallop.  Pulmonary:     Effort: Pulmonary effort is normal. No respiratory distress.     Breath sounds: Normal breath sounds. No wheezing, rhonchi or rales.  Chest:     Chest wall: No tenderness.  Abdominal:     General: Bowel sounds are normal. There is no distension.     Palpations: Abdomen is soft. There is no mass.     Tenderness: There is no abdominal tenderness. There is no right CVA tenderness, left CVA tenderness, guarding or rebound.  Musculoskeletal:        General: No swelling.     Right shoulder: Tenderness present. No swelling, effusion or crepitus. Decreased range of motion. Normal strength. Normal pulse.     Left shoulder: Normal.     Cervical back: Normal range of motion. No rigidity or tenderness.     Right lower leg: No edema.     Left lower leg: No edema.  Lymphadenopathy:     Cervical: No cervical adenopathy.  Skin:    General: Skin is warm and dry.     Coloration: Skin is not pale.     Findings: No bruising, erythema, lesion or rash.  Neurological:     Mental Status: She is alert. Mental status is at baseline.     Cranial Nerves: No cranial nerve deficit.     Sensory: No sensory deficit.     Motor: No weakness.     Coordination: Coordination normal.     Gait: Gait abnormal.  Psychiatric:        Mood and Affect: Mood normal.        Speech: Speech normal.        Behavior: Behavior normal.        Thought Content: Thought content normal.        Cognition and Memory:  Cognition is impaired. Memory is impaired.        Judgment: Judgment normal.     Comments: Calm during the visit     Labs reviewed: Recent Labs    09/25/21 0803 10/02/21 1450  NA 140 140  K 5.4* 4.4  CL 111* 110  CO2 20 20  GLUCOSE 115* 111  BUN 37* 35*  CREATININE 1.85* 1.66*  CALCIUM 9.6 9.3   Recent Labs    09/25/21 0803  AST 22  ALT 24  BILITOT 0.3  PROT 6.9   Recent Labs    09/25/21 0803  WBC 5.4  NEUTROABS 2,576  HGB 12.1  HCT 36.7  MCV 95.8  PLT 272   Lab Results  Component Value Date   TSH 1.17 03/24/2021   Lab Results  Component Value Date   HGBA1C 5.9 (H) 09/25/2021   Lab Results  Component Value Date   CHOL 152 09/25/2021   HDL 68 09/25/2021   LDLCALC 70 09/25/2021   TRIG 55 09/25/2021   CHOLHDL 2.2 09/25/2021    Significant Diagnostic Results in last 30 days:  No results  found.  Assessment/Plan 1. Generalized anxiety disorder Calm during the visit -Continue on lorazepam as needed.  Per daughter mostly takes at bedtime and none during the day. - LORazepam (ATIVAN) 0.5 MG tablet; Take 1 tablet (0.5 mg total) by mouth 2 (two) times daily as needed. for anxiety  Dispense: 60 tablet; Refill: 0  2. Type 2 diabetes mellitus with stage 4 chronic kidney disease, without long-term current use of insulin (HCC) Lab Results  Component Value Date   HGBA1C 5.9 (H) 09/25/2021  -No home CBG for review daughter requests new glucometer and supplies. -Check blood sugars daily and as needed for symptoms hypoglycemia or hyperglycemia -Has upcoming appointment for podiatry and ophthalmology for annual eye and foot exam -Continue on glipizide -Continue on losartan for renal protection -Continue on statin aspirin for cardiovascular event prevention - TSH - COMPLETE METABOLIC PANEL WITH GFR - CBC with Differential/Platelet - Hemoglobin A1c  3. Essential hypertension Blood pressure at goal -Continue on amlodipine, losartan and hydralazine - TSH -  COMPLETE METABOLIC PANEL WITH GFR - CBC with Differential/Platelet - amLODipine (NORVASC) 10 MG tablet; Take 1 tablet (10 mg total) by mouth daily.  Dispense: 30 tablet; Refill: 3 - hydrALAZINE (APRESOLINE) 25 MG tablet; Take 1 tablet (25 mg total) by mouth 3 (three) times daily.  Dispense: 90 tablet; Refill: 1 - losartan (COZAAR) 50 MG tablet; Take 1 tablet (50 mg total) by mouth daily.  Dispense: 30 tablet; Refill: 3  4. Hyperlipidemia LDL goal <70 Previous LDL at goal Continue on simvastatin Dietary modification and exercise advised - Lipid panel - simvastatin (ZOCOR) 10 MG tablet; Take 1 tablet (10 mg total) by mouth at bedtime.  Dispense: 30 tablet; Refill: 3  5. Chronic diastolic congestive heart failure (HCC) No signs of fluid overload Noted on diuretic -Continue to monitor weight, worsening shortness of breath or edema  6. Chronic pain of right knee Has upcoming appointment with orthopedic after this visit -Continue over-the-counter Tylenol  7. Late onset Alzheimer's dementia without behavioral disturbance ([place]) Has had worsening sundowning and being combative. Also throwing food away into the toilet and putting out of stuff in the toilet.  Not sleeping at night -Continue with daily care program -Continue with supportive care -Will start on Depakote 125 mg daily at bedtime for behavioral agitation and sundowning.  Will increase dosage to twice daily if not effective.   8. Urinary incontinence, unspecified type Afebrile Incontinence has worsened per daughter would like to rule out urinary tract infection.  Negative exam finding - Urine Culture  9. Chronic gout without tophus, unspecified cause, unspecified site -No recent flareup - Continue on allopurinol - allopurinol (ZYLOPRIM) 100 MG tablet; Take 1 tablet (100 mg total) by mouth daily.  Dispense: 30 tablet; Refill: 0  10. Seasonal allergic rhinitis, unspecified trigger Stable on Flonase - fluticasone (FLONASE) 50  MCG/ACT nasal spray; USE 1 SPRAY(S) IN EACH NOSTRIL TWICE DAILY AS NEEDED FOR ALLERGIES  Dispense: 16 g; Refill: 11  11. Candidiasis of skin Intermittent.  Continue with nystatin cream as needed. - nystatin cream (MYCOSTATIN); Apply 1 Application topically 2 (two) times daily.  Dispense: 30 g; Refill: 0  12. Chronic right shoulder pain Has had worsening range of motion unable to raise her arm above head. -Will obtain imaging then referred to orthopedic -Continue over-the-counter analgesics  Family/ staff Communication: Reviewed plan of care with patient and daughter verbalized understanding  Labs/tests ordered:  - Urine Culture - CBC with Differential/Platelet - CMP with eGFR([facility]) - TSH -  Hgb A1C - Lipid panel  Next Appointment : Return in about 6 months (around 10/15/2022) for medical mangement of chronic issues.Sandrea Hughs, NP

## 2022-04-15 LAB — CBC WITH DIFFERENTIAL/PLATELET
Absolute Monocytes: 426 cells/uL (ref 200–950)
Basophils Absolute: 22 cells/uL (ref 0–200)
Basophils Relative: 0.4 %
Eosinophils Absolute: 22 cells/uL (ref 15–500)
Eosinophils Relative: 0.4 %
HCT: 36 % (ref 35.0–45.0)
Hemoglobin: 11.9 g/dL (ref 11.7–15.5)
Lymphs Abs: 2660 cells/uL (ref 850–3900)
MCH: 31.1 pg (ref 27.0–33.0)
MCHC: 33.1 g/dL (ref 32.0–36.0)
MCV: 94 fL (ref 80.0–100.0)
MPV: 9.4 fL (ref 7.5–12.5)
Monocytes Relative: 7.6 %
Neutro Abs: 2470 cells/uL (ref 1500–7800)
Neutrophils Relative %: 44.1 %
Platelets: 346 10*3/uL (ref 140–400)
RBC: 3.83 10*6/uL (ref 3.80–5.10)
RDW: 12.1 % (ref 11.0–15.0)
Total Lymphocyte: 47.5 %
WBC: 5.6 10*3/uL (ref 3.8–10.8)

## 2022-04-15 LAB — COMPLETE METABOLIC PANEL WITH GFR
AG Ratio: 1.1 (calc) (ref 1.0–2.5)
ALT: 25 U/L (ref 6–29)
AST: 22 U/L (ref 10–35)
Albumin: 4.1 g/dL (ref 3.6–5.1)
Alkaline phosphatase (APISO): 72 U/L (ref 37–153)
BUN/Creatinine Ratio: 23 (calc) — ABNORMAL HIGH (ref 6–22)
BUN: 32 mg/dL — ABNORMAL HIGH (ref 7–25)
CO2: 22 mmol/L (ref 20–32)
Calcium: 10 mg/dL (ref 8.6–10.4)
Chloride: 109 mmol/L (ref 98–110)
Creat: 1.37 mg/dL — ABNORMAL HIGH (ref 0.60–0.95)
Globulin: 3.6 g/dL (calc) (ref 1.9–3.7)
Glucose, Bld: 109 mg/dL — ABNORMAL HIGH (ref 65–99)
Potassium: 4.9 mmol/L (ref 3.5–5.3)
Sodium: 140 mmol/L (ref 135–146)
Total Bilirubin: 0.4 mg/dL (ref 0.2–1.2)
Total Protein: 7.7 g/dL (ref 6.1–8.1)
eGFR: 38 mL/min/{1.73_m2} — ABNORMAL LOW (ref >=60)

## 2022-04-15 LAB — URINE CULTURE
MICRO NUMBER:: 14659032
SPECIMEN QUALITY:: ADEQUATE

## 2022-04-15 LAB — LIPID PANEL
Cholesterol: 174 mg/dL (ref <200)
HDL: 65 mg/dL (ref >=50)
LDL Cholesterol (Calc): 89 mg/dL (calc)
Non-HDL Cholesterol (Calc): 109 mg/dL (calc) (ref <130)
Total CHOL/HDL Ratio: 2.7 (calc) (ref <5.0)
Triglycerides: 105 mg/dL (ref <150)

## 2022-04-15 LAB — HEMOGLOBIN A1C
Hgb A1c MFr Bld: 6.5 % of total Hgb — ABNORMAL HIGH (ref <5.7)
Mean Plasma Glucose: 140 mg/dL
eAG (mmol/L): 7.7 mmol/L

## 2022-04-15 LAB — TSH: TSH: 2.01 mIU/L (ref 0.40–4.50)

## 2022-04-15 NOTE — Telephone Encounter (Signed)
Patient daughter states that she will pick it up all the rest of her medications after work.

## 2022-04-15 NOTE — Progress Notes (Signed)
This encounter was created in error - please disregard. Error   

## 2022-04-16 NOTE — Telephone Encounter (Signed)
Talked with patient's daughter and advised her that Medicaid does not cover any gel injections.  Voiced that she understands.

## 2022-04-19 ENCOUNTER — Other Ambulatory Visit: Payer: Self-pay | Admitting: Family

## 2022-04-19 DIAGNOSIS — G8929 Other chronic pain: Secondary | ICD-10-CM

## 2022-04-22 ENCOUNTER — Ambulatory Visit (INDEPENDENT_AMBULATORY_CARE_PROVIDER_SITE_OTHER): Payer: Medicaid Other | Admitting: Physician Assistant

## 2022-04-22 ENCOUNTER — Encounter: Payer: Self-pay | Admitting: Physician Assistant

## 2022-04-22 DIAGNOSIS — M25512 Pain in left shoulder: Secondary | ICD-10-CM

## 2022-04-22 DIAGNOSIS — G8929 Other chronic pain: Secondary | ICD-10-CM | POA: Insufficient documentation

## 2022-04-22 DIAGNOSIS — M79644 Pain in right finger(s): Secondary | ICD-10-CM

## 2022-04-22 MED ORDER — METHYLPREDNISOLONE ACETATE 40 MG/ML IJ SUSP
40.0000 mg | INTRAMUSCULAR | Status: AC | PRN
  Administered 2022-04-22: 40 mg via INTRA_ARTICULAR

## 2022-04-22 MED ORDER — LIDOCAINE HCL 1 % IJ SOLN
2.0000 mL | INTRAMUSCULAR | Status: AC | PRN
  Administered 2022-04-22: 2 mL

## 2022-04-22 MED ORDER — BUPIVACAINE HCL 0.25 % IJ SOLN
2.0000 mL | INTRAMUSCULAR | Status: AC | PRN
  Administered 2022-04-22: 2 mL via INTRA_ARTICULAR

## 2022-04-22 NOTE — Progress Notes (Signed)
Office Visit Note   Patient: Michelle Rowe           Date of Birth: [birth year]           MRN: [id number] Visit Date: 04/22/2022              Requested by: Sandrea Hughs, NP [address] [place],  [place] [address] PCP: Sandrea Hughs, NP   Assessment & Plan: Visit Diagnoses: left shoulder pain   Plan: [name] is a pleasant 82 year old woman who is cared for and accompanied by her daughter.  She has had a 1 month history of left shoulder pain after a fall.  She did have x-rays done which demonstrated a arthritic changes both in the glenohumeral joint and the [facility] joint with a high riding humeral head concerning for rotator cuff tear.  Her daughter says she is left-handed and prior to a month ago did not have any problems with her shoulder.  Based on this we will try some physical therapy and I offered her an injection into the subacromial space today.  She like to go forward with this.  If she does not improve after a week to 10 days I would refer her to Dr. Rolena Infante or to [facility] for a ultrasound-guided injection into the glenohumeral joint  Follow-Up Instructions: No follow-ups on file.   Orders:  No orders of the defined types were placed in this encounter.  No orders of the defined types were placed in this encounter.     Procedures: Large Joint Inj: L subacromial bursa on 04/22/2022 9:25 AM Indications: diagnostic evaluation and pain Details: 25 G 1.5 in needle, posterior approach  Arthrogram: No  Medications: 40 mg methylPREDNISolone acetate 40 MG/ML; 2 mL lidocaine 1 %; 2 mL bupivacaine 0.25 % Outcome: tolerated well, no immediate complications Procedure, treatment alternatives, risks and benefits explained, specific risks discussed. Consent was given by the patient.     Clinical Data: No additional findings.   Subjective: Chief Complaint  Patient presents with   Left Shoulder - Pain    HPI pleasant 82 year old woman who is cared for by her daughter.  She is left-hand  dominant.  She sustained a fall about a month ago and is complained of left shoulder pain especially with forward elevation of her arm since.  Denies any previous history of left shoulder pain Review of Systems  All other systems reviewed and are negative.    Objective: Vital Signs: There were no vitals taken for this visit.  Physical Exam Constitutional:      Appearance: Normal appearance.  Pulmonary:     Effort: Pulmonary effort is normal.  Skin:    General: Skin is warm and dry.  Neurological:     General: No focal deficit present.     Mental Status: She is alert.    Ortho Exam Left shoulder exam.  She can forward elevate to about 115 degrees.  After that she has moderate pain.  She is even able to weakly sustain her arm raised.  She is neurovascular intact good grip strength she can abduct her arms and strength is about equal side-to-side.  She does have some increased pain with empty can testing.  She has fairly good motion with external rotation and not as much pain Specialty Comments:  No specialty comments available.  Imaging: No results found.   PMFS History: Patient Active Problem List   Diagnosis Date Noted   Pain in left shoulder 04/22/2022   Bilateral  primary osteoarthritis of knee [id number]   Diastolic dysfunction without heart failure 11/08/2019   Essential hypertension 11/08/2019   Cough due to ACE inhibitor 11/08/2019   Past Medical History:  Diagnosis Date   Anterolisthesis    Grade 1 of L3 on L4 on x-ray 07/18/19   Aortic regurgitation    Seen on 2D echo 07/07/16   Cataract    CKD (chronic kidney disease), stage III (HCC)    DDD (degenerative disc disease), lumbar    Diabetes mellitus without complication (HCC)    Diastolic CHF (HCC)    Grade 2/4 with LVEF of 123456   Diastolic dysfunction    Grade 2/4 with LVEF of 60-65% and LVH   Gout    HLD (hyperlipidemia)    Hyperparathyroidism (HCC)    Hypertension    Lumbar facet arthropathy    Memory  impairment    Mitral valve regurgitation    Seen on 2D echo 07/07/16   OA (osteoarthritis)    OSA (obstructive sleep apnea)    Osteoarthritis    Polycythemia    Shingles    Sleep apnea    Thrombocytopenia ([place])    Vitamin D deficiency     Family History  Problem Relation Age of Onset   Diabetes Mother    Hypertension Father    Hypertension Brother    Hypertension Sister    Diabetes Sister     Past Surgical History:  Procedure Laterality Date   LAPAROSCOPIC HYSTERECTOMY     Social History   Occupational History    Comment: NA  Tobacco Use   Smoking status: Never   Smokeless tobacco: Never  Substance and Sexual Activity   Alcohol use: Never   Drug use: Never   Sexual activity: Not Currently

## 2022-04-23 ENCOUNTER — Ambulatory Visit: Payer: Medicaid Other | Admitting: Physician Assistant

## 2022-06-08 ENCOUNTER — Other Ambulatory Visit: Payer: Self-pay

## 2022-06-08 ENCOUNTER — Encounter: Payer: Self-pay | Admitting: Physical Therapy

## 2022-06-08 ENCOUNTER — Ambulatory Visit: Payer: Medicaid Other | Attending: Physician Assistant | Admitting: Physical Therapy

## 2022-06-08 DIAGNOSIS — R293 Abnormal posture: Secondary | ICD-10-CM | POA: Diagnosis present

## 2022-06-08 DIAGNOSIS — M25512 Pain in left shoulder: Secondary | ICD-10-CM | POA: Insufficient documentation

## 2022-06-08 DIAGNOSIS — M6281 Muscle weakness (generalized): Secondary | ICD-10-CM

## 2022-06-08 NOTE — Therapy (Signed)
OUTPATIENT PHYSICAL THERAPY SHOULDER EVALUATION   Patient Name: Michelle Rowe MRN: [id number] DOB:1940/07/14, 82 y.o., female Today's Date: 06/08/2022  END OF SESSION:  PT End of Session - 06/08/22 0815     Visit Number 1    PT Start Time 0815   late arrival   PT Stop Time 0845    PT Time Calculation (min) 30 min    Activity Tolerance Patient tolerated treatment well             Past Medical History:  Diagnosis Date   Anterolisthesis    Grade 1 of L3 on L4 on x-ray 07/18/19   Aortic regurgitation    Seen on 2D echo 07/07/16   Cataract    CKD (chronic kidney disease), stage III (HCC)    DDD (degenerative disc disease), lumbar    Diabetes mellitus without complication (HCC)    Diastolic CHF (HCC)    Grade 2/4 with LVEF of 60-65%   Diastolic dysfunction    Grade 2/4 with LVEF of 60-65% and LVH   Gout    HLD (hyperlipidemia)    Hyperparathyroidism (HCC)    Hypertension    Lumbar facet arthropathy    Memory impairment    Mitral valve regurgitation    Seen on 2D echo 07/07/16   OA (osteoarthritis)    OSA (obstructive sleep apnea)    Osteoarthritis    Polycythemia    Shingles    Sleep apnea    Thrombocytopenia (HCC)    Vitamin D deficiency    Past Surgical History:  Procedure Laterality Date   LAPAROSCOPIC HYSTERECTOMY     Patient Active Problem List   Diagnosis Date Noted   Pain in left shoulder 04/22/2022   Bilateral primary osteoarthritis of knee 06/18/2021   Diastolic dysfunction without heart failure 11/08/2019   Essential hypertension 11/08/2019   Cough due to ACE inhibitor 11/08/2019    PCP: Richarda Blade, NP  REFERRING PROVIDER: Persons, [place], [place]  REFERRING DIAG: [phone] (ICD-10-CM) - Acute pain of left shoulder  THERAPY DIAG:  Acute pain of left shoulder  Abnormal posture  Muscle weakness (generalized)  Rationale for Evaluation and Treatment: Rehabilitation  ONSET DATE: ~2 months ago  SUBJECTIVE:                                                                                                                                                                                       SUBJECTIVE STATEMENT: Pt's daughter provides history due to pt's dementia. Pt fell 2 months ago in the shower and L shoulder has been bothering her. Pt did get injection in L shoulder 3/14. Pt's daughter notes no changes. Before  the fall no prior injuries to shoulder. Pt goes to day center and has family assist in the evenings. Pt is still able to feed herself but requires assist for using the bathroom and bathing. Daughter notes the difficulty is primarily seen while pt bathes and that pt will avoid using L UE Hand dominance: Left  PERTINENT HISTORY: History falls, Pt has history of dementia/alzheimer's  PAIN:  Are you having pain? Yes: NPRS scale: 0 currently, /10 Pain location: side of shoulder Pain description: sometimes sharp Aggravating factors: moving arm a certain way Relieving factors: rest  PRECAUTIONS: Fall  WEIGHT BEARING RESTRICTIONS: No  FALLS:  Has patient fallen in last 6 months? Yes. Number of falls 4  LIVING ENVIRONMENT: Lives with: lives with their family Lives in: House/apartment  OCCUPATION: Retired  PLOF: Independent  PATIENT GOALS:Pt unable to provide goals; daughter would like pt to return to [facility]  NEXT MD VISIT:   OBJECTIVE:   DIAGNOSTIC FINDINGS:  04/14/22 L shoulder x-ray IMPRESSION: 1. Degenerative changes as above. 2. The humeral head is high riding on the transscapular Y-view suggesting a possible rotator cuff tear.  PATIENT SURVEYS:  Quick Dash 61.4%  COGNITION: Overall cognitive status:  History of dementia/alzheimer's. Difficulty following complex commands or answering more complex questions     SENSATION: Pt does not complain of any sensory issues  POSTURE: Forward head, rounded shoulders  UPPER EXTREMITY ROM:   Active ROM Right eval Left eval  Shoulder flexion 120 133  Shoulder  extension 50 38  Shoulder abduction 110 110  Shoulder adduction    Shoulder internal rotation  Reaches L5  Shoulder external rotation  Reaches C7  Elbow flexion    Elbow extension    Wrist flexion    Wrist extension    Wrist ulnar deviation    Wrist radial deviation    Wrist pronation    Wrist supination    (Blank rows = not tested)  UPPER EXTREMITY MMT:  MMT Right eval Left eval  Shoulder flexion 4+ 4+  Shoulder extension    Shoulder abduction 4+ 4+  Shoulder adduction    Shoulder internal rotation 3+ 3+  Shoulder external rotation 3+ 3  Middle trapezius    Lower trapezius    Elbow flexion    Elbow extension    Wrist flexion    Wrist extension    Wrist ulnar deviation    Wrist radial deviation    Wrist pronation    Wrist supination    Grip strength (lbs) 50, 35, 30 45, 43, 50  (Blank rows = not tested)  SHOULDER SPECIAL TESTS: Impingement tests: Painful arc test: negative SLAP lesions: Biceps load test: negative Instability tests: Apprehension test: negative Rotator cuff assessment: Drop arm test: negative and Empty can test: negative Biceps assessment: Speed's test: negative  JOINT MOBILITY TESTING:  WFL  PALPATION:  No overt tenderness to palpation   TODAY'S TREATMENT:  DATE: 06/08/22 Unable to initiate HEP due to truncated session   PATIENT EDUCATION: Education details: Exam findings, POC Person educated: Patient and Child(ren) Education method: Medical illustrator Education comprehension: verbalized understanding and needs further education  HOME EXERCISE PROGRAM: Will initiate next session  ASSESSMENT:  CLINICAL IMPRESSION: Patient is a 82 y.o. F who was seen today for physical therapy evaluation and treatment for L shoulder weakness and pain. Pt did receive an injection in her shoulder in March. PMH  significant for dementia/alzheimer's with difficulty following complex commands and answering complex questions. Assessment slightly limited due to her cognitive status. Pt did not demonstrate any overt shoulder pain during AROM except slightly with extension and IR. Pt demos primarily IR/ER weakness in bilat shoulders. Pt does demonstrate avoidance of using L UE worrisome for increased weakness over time and learned disuse. Pt would benefit from short course of PT for strength and prevention of disuse of L UE.   OBJECTIVE IMPAIRMENTS: decreased activity tolerance, decreased ROM, decreased strength, impaired UE functional use, improper body mechanics, postural dysfunction, and pain.   ACTIVITY LIMITATIONS: carrying, lifting, transfers, bathing, toileting, dressing, reach over head, and hygiene/grooming  PARTICIPATION LIMITATIONS: cleaning and shopping  PERSONAL FACTORS: Age, Fitness, Past/current experiences, Time since onset of injury/illness/exacerbation, and 1 comorbidity: dementia  are also affecting patient's functional outcome.   REHAB POTENTIAL: Good  CLINICAL DECISION MAKING: Stable/uncomplicated  EVALUATION COMPLEXITY: Low   GOALS: Goals reviewed with patient? Yes  SHORT TERM GOALS: Target date: 06/22/2022   Pt will maintain L shoulder AROM with no pain Baseline: Goal status: INITIAL   LONG TERM GOALS: Target date: 07/06/2022   Pt will be able to reach behind her back without complaint of pain for dressing and bathing tasks Baseline:  Goal status: INITIAL  2.  Pt will be able to lift and carry a 5# bag in her L UE to demo improving use and mobility Baseline:  Goal status: INITIAL  3.  Daughter will report full return of pt's PLOF with her L UE use Baseline:  Goal status: INITIAL  4.  Pt will have improved Quick DASH to </=51.4% to demo MCID Baseline:  Goal status: INITIAL   PLAN:  PT FREQUENCY: 2x/week  PT DURATION: 4 weeks  PLANNED INTERVENTIONS:  Therapeutic exercises, Therapeutic activity, Neuromuscular re-education, Balance training, Gait training, Patient/Family education, Self Care, Joint mobilization, Electrical stimulation, Cryotherapy, Moist heat, Taping, Ionotophoresis 4mg /ml Dexamethasone, Manual therapy, and Re-evaluation  PLAN FOR NEXT SESSION: Initiate HEP working on gross L RTC and periscapular strenthening. Encourage functional L UE use (I.e. holding/lifting bags)   Stanley Helmuth April Ma L Wilhelm Ganaway, PT 06/08/2022, 11:27 AM

## 2022-06-16 ENCOUNTER — Encounter: Payer: Self-pay | Admitting: Physical Therapy

## 2022-06-16 ENCOUNTER — Ambulatory Visit: Payer: Medicaid Other | Attending: Physician Assistant | Admitting: Physical Therapy

## 2022-06-16 DIAGNOSIS — R293 Abnormal posture: Secondary | ICD-10-CM | POA: Insufficient documentation

## 2022-06-16 DIAGNOSIS — M25512 Pain in left shoulder: Secondary | ICD-10-CM | POA: Diagnosis present

## 2022-06-16 DIAGNOSIS — M6281 Muscle weakness (generalized): Secondary | ICD-10-CM | POA: Insufficient documentation

## 2022-06-16 NOTE — Therapy (Signed)
OUTPATIENT PHYSICAL THERAPY SHOULDER TREATMENT   Patient Name: Michelle Rowe MRN: [id number] DOB:March 10, 1940, 82 y.o., female Today's Date: 06/16/2022  END OF SESSION:  PT End of Session - 06/16/22 0814     Visit Number 2    Number of Visits 8    Date for PT Re-Evaluation 07/06/22    Authorization Type Medicaid [employer] -- auth requested 06/08/22    PT Start Time 0810   late arrival   PT Stop Time 0845    PT Time Calculation (min) 35 min    Activity Tolerance Patient tolerated treatment well              Past Medical History:  Diagnosis Date   Anterolisthesis    Grade 1 of L3 on L4 on x-ray 07/18/19   Aortic regurgitation    Seen on 2D echo 07/07/16   Cataract    CKD (chronic kidney disease), stage III (HCC)    DDD (degenerative disc disease), lumbar    Diabetes mellitus without complication (HCC)    Diastolic CHF (HCC)    Grade 2/4 with LVEF of 60-65%   Diastolic dysfunction    Grade 2/4 with LVEF of 60-65% and LVH   Gout    HLD (hyperlipidemia)    Hyperparathyroidism (HCC)    Hypertension    Lumbar facet arthropathy    Memory impairment    Mitral valve regurgitation    Seen on 2D echo 07/07/16   OA (osteoarthritis)    OSA (obstructive sleep apnea)    Osteoarthritis    Polycythemia    Shingles    Sleep apnea    Thrombocytopenia (HCC)    Vitamin D deficiency    Past Surgical History:  Procedure Laterality Date   LAPAROSCOPIC HYSTERECTOMY     Patient Active Problem List   Diagnosis Date Noted   Pain in left shoulder 04/22/2022   Bilateral primary osteoarthritis of knee 06/18/2021   Diastolic dysfunction without heart failure 11/08/2019   Essential hypertension 11/08/2019   Cough due to ACE inhibitor 11/08/2019    PCP: Richarda Blade, NP  REFERRING PROVIDER: Persons, [place], [place]  REFERRING DIAG: [phone] (ICD-10-CM) - Acute pain of left shoulder  THERAPY DIAG:  Acute pain of left shoulder  Abnormal posture  Muscle weakness  (generalized)  Rationale for Evaluation and Treatment: Rehabilitation  ONSET DATE: ~2 months ago  SUBJECTIVE:                                                                                                                                                                                      SUBJECTIVE STATEMENT: Pt is noting no pain in her shoulder this morning.  From eval: Pt's daughter provides history due to pt's dementia. Pt fell 2 months ago in the shower and L shoulder has been bothering her. Pt did get injection in L shoulder 3/14. Pt's daughter notes no changes. Before the fall no prior injuries to shoulder. Pt goes to day center and has family assist in the evenings. Pt is still able to feed herself but requires assist for using the bathroom and bathing. Daughter notes the difficulty is primarily seen while pt bathes and that pt will avoid using L UE Hand dominance: Left  PERTINENT HISTORY: History falls, Pt has history of dementia/alzheimer's  PAIN:  Are you having pain? Yes: NPRS scale: 0 currently, /10 Pain location: side of shoulder Pain description: sometimes sharp Aggravating factors: moving arm a certain way Relieving factors: rest  PRECAUTIONS: Fall  WEIGHT BEARING RESTRICTIONS: No  FALLS:  Has patient fallen in last 6 months? Yes. Number of falls 4  LIVING ENVIRONMENT: Lives with: lives with their family Lives in: House/apartment  OCCUPATION: Retired  PLOF: Independent  PATIENT GOALS:Pt unable to provide goals; daughter would like pt to return to [facility]  NEXT MD VISIT:   OBJECTIVE:   DIAGNOSTIC FINDINGS:  04/14/22 L shoulder x-ray IMPRESSION: 1. Degenerative changes as above. 2. The humeral head is high riding on the transscapular Y-view suggesting a possible rotator cuff tear.  PATIENT SURVEYS:  Quick Dash 61.4%  COGNITION: Overall cognitive status:  History of dementia/alzheimer's. Difficulty following complex commands or answering more complex  questions     SENSATION: Pt does not complain of any sensory issues  POSTURE: Forward head, rounded shoulders  UPPER EXTREMITY ROM:   Active ROM Right eval Left eval  Shoulder flexion 120 133  Shoulder extension 50 38  Shoulder abduction 110 110  Shoulder adduction    Shoulder internal rotation  Reaches L5  Shoulder external rotation  Reaches C7  Elbow flexion    Elbow extension    Wrist flexion    Wrist extension    Wrist ulnar deviation    Wrist radial deviation    Wrist pronation    Wrist supination    (Blank rows = not tested)  UPPER EXTREMITY MMT:  MMT Right eval Left eval  Shoulder flexion 4+ 4+  Shoulder extension    Shoulder abduction 4+ 4+  Shoulder adduction    Shoulder internal rotation 3+ 3+  Shoulder external rotation 3+ 3  Middle trapezius    Lower trapezius    Elbow flexion    Elbow extension    Wrist flexion    Wrist extension    Wrist ulnar deviation    Wrist radial deviation    Wrist pronation    Wrist supination    Grip strength (lbs) 50, 35, 30 45, 43, 50  (Blank rows = not tested)  SHOULDER SPECIAL TESTS: Impingement tests: Painful arc test: negative SLAP lesions: Biceps load test: negative Instability tests: Apprehension test: negative Rotator cuff assessment: Drop arm test: negative and Empty can test: negative Biceps assessment: Speed's test: negative  JOINT MOBILITY TESTING:  WFL  PALPATION:  No overt tenderness to palpation   TODAY'S TREATMENT:     [facility] Adult PT Treatment:                                                DATE: 06/16/22 Therapeutic Exercise: Nustep L6 x 5  min UEs/LEs Pulleys flexion, abduction, horizontal abd x1 min Shoulder ER yellow TB 2x10 Shoulder scaption 2# x10 R&L Rows red TB 2x10 Shoulder ext red TB 2x10 Shoulder IR red TB 2x10 Cones to first shelf of cabinet Shoulder IR with towel horizontal and then vertical 2x10 each                                                                                                                                         DATE: 06/08/22 Unable to initiate HEP due to truncated session   PATIENT EDUCATION: Education details: Exam findings, POC Person educated: Patient and Child(ren) Education method: Explanation and Demonstration Education comprehension: verbalized understanding and needs further education  HOME EXERCISE PROGRAM: Access Code: [email or web address] URL: [email or web address] Date: 06/16/2022 Prepared by: Vernon Prey April Kirstie Peri  Exercises - Shoulder External Rotation and Scapular Retraction  - 1 x daily - 7 x weekly - 2 sets - 10 reps - Single Arm Shoulder Flexion with Dumbbell  - 1 x daily - 7 x weekly - 1 sets - 10 reps - Standing Shoulder Internal Rotation AAROM Behind Back with Towel   - 1 x daily - 7 x weekly - 2 sets - 10 reps  ASSESSMENT:  CLINICAL IMPRESSION: Treatment session focused on encouraging L UE use and strengthening. Pt tolerated well with no issues. Initiated HEP today.  From eval: Patient is a 82 y.o. F who was seen today for physical therapy evaluation and treatment for L shoulder weakness and pain. Pt did receive an injection in her shoulder in March. PMH significant for dementia/alzheimer's with difficulty following complex commands and answering complex questions. Assessment slightly limited due to her cognitive status. Pt did not demonstrate any overt shoulder pain during AROM except slightly with extension and IR. Pt demos primarily IR/ER weakness in bilat shoulders. Pt does demonstrate avoidance of using L UE worrisome for increased weakness over time and learned disuse. Pt would benefit from short course of PT for strength and prevention of disuse of L UE.   OBJECTIVE IMPAIRMENTS: decreased activity tolerance, decreased ROM, decreased strength, impaired UE functional use, improper body mechanics, postural dysfunction, and pain.    GOALS: Goals reviewed with patient? Yes  SHORT TERM GOALS: Target  date: 06/22/2022   Pt will maintain L shoulder AROM with no pain Baseline: Goal status: INITIAL   LONG TERM GOALS: Target date: 07/06/2022   Pt will be able to reach behind her back without complaint of pain for dressing and bathing tasks Baseline:  Goal status: INITIAL  2.  Pt will be able to lift and carry a 5# bag in her L UE to demo improving use and mobility Baseline:  Goal status: INITIAL  3.  Daughter will report full return of pt's PLOF with her L UE use Baseline:  Goal status: INITIAL  4.  Pt will have improved Quick DASH to </=51.4% to demo MCID  Baseline:  Goal status: INITIAL   PLAN:  PT FREQUENCY: 2x/week  PT DURATION: 4 weeks  PLANNED INTERVENTIONS: Therapeutic exercises, Therapeutic activity, Neuromuscular re-education, Balance training, Gait training, Patient/Family education, Self Care, Joint mobilization, Electrical stimulation, Cryotherapy, Moist heat, Taping, Ionotophoresis 4mg /ml Dexamethasone, Manual therapy, and Re-evaluation  PLAN FOR NEXT SESSION: Initiate HEP working on gross L RTC and periscapular strenthening. Encourage functional L UE use (I.e. holding/lifting bags)   Milferd Ansell April Ma L Zavier Canela, PT 06/16/2022, 8:15 AM

## 2022-06-23 ENCOUNTER — Ambulatory Visit: Payer: Medicaid Other | Admitting: Physical Therapy

## 2022-06-30 ENCOUNTER — Ambulatory Visit: Payer: Medicaid Other | Admitting: Physical Therapy

## 2022-06-30 ENCOUNTER — Encounter: Payer: Self-pay | Admitting: Physical Therapy

## 2022-06-30 DIAGNOSIS — R293 Abnormal posture: Secondary | ICD-10-CM

## 2022-06-30 DIAGNOSIS — M25512 Pain in left shoulder: Secondary | ICD-10-CM | POA: Diagnosis not present

## 2022-06-30 DIAGNOSIS — M6281 Muscle weakness (generalized): Secondary | ICD-10-CM

## 2022-06-30 NOTE — Therapy (Signed)
OUTPATIENT PHYSICAL THERAPY SHOULDER TREATMENT   Patient Name: Michelle Rowe MRN: [id number] DOB:1940/05/09, 82 y.o., female Today's Date: 06/30/2022  END OF SESSION:  PT End of Session - 06/30/22 0812     Visit Number 3    Number of Visits 8    Date for PT Re-Evaluation 07/06/22    Authorization Type Medicaid [employer] -- auth requested 06/08/22    PT Start Time 0812   late arrival   PT Stop Time 0845    PT Time Calculation (min) 33 min    Activity Tolerance Patient tolerated treatment well              Past Medical History:  Diagnosis Date   Anterolisthesis    Grade 1 of L3 on L4 on x-ray 07/18/19   Aortic regurgitation    Seen on 2D echo 07/07/16   Cataract    CKD (chronic kidney disease), stage III (HCC)    DDD (degenerative disc disease), lumbar    Diabetes mellitus without complication (HCC)    Diastolic CHF (HCC)    Grade 2/4 with LVEF of 60-65%   Diastolic dysfunction    Grade 2/4 with LVEF of 60-65% and LVH   Gout    HLD (hyperlipidemia)    Hyperparathyroidism (HCC)    Hypertension    Lumbar facet arthropathy    Memory impairment    Mitral valve regurgitation    Seen on 2D echo 07/07/16   OA (osteoarthritis)    OSA (obstructive sleep apnea)    Osteoarthritis    Polycythemia    Shingles    Sleep apnea    Thrombocytopenia (HCC)    Vitamin D deficiency    Past Surgical History:  Procedure Laterality Date   LAPAROSCOPIC HYSTERECTOMY     Patient Active Problem List   Diagnosis Date Noted   Pain in left shoulder 04/22/2022   Bilateral primary osteoarthritis of knee 06/18/2021   Diastolic dysfunction without heart failure 11/08/2019   Essential hypertension 11/08/2019   Cough due to ACE inhibitor 11/08/2019    PCP: Richarda Blade, NP  REFERRING PROVIDER: Persons, [place], [place]  REFERRING DIAG: [id number] (ICD-10-CM) - Acute pain of left shoulder  THERAPY DIAG:  Acute pain of left shoulder  Abnormal posture  Muscle weakness  (generalized)  Rationale for Evaluation and Treatment: Rehabilitation  ONSET DATE: ~2 months ago  SUBJECTIVE:                                                                                                                                                                                      SUBJECTIVE STATEMENT: Pt reports no pain in her shoulder this morning. Family  not present to provide further information on progress at home. Pt states her sister brought her in today.  From eval: Pt's daughter provides history due to pt's dementia. Pt fell 2 months ago in the shower and L shoulder has been bothering her. Pt did get injection in L shoulder 3/14. Pt's daughter notes no changes. Before the fall no prior injuries to shoulder. Pt goes to day center and has family assist in the evenings. Pt is still able to feed herself but requires assist for using the bathroom and bathing. Daughter notes the difficulty is primarily seen while pt bathes and that pt will avoid using L UE Hand dominance: Left  PERTINENT HISTORY: History falls, Pt has history of dementia/alzheimer's  PAIN:  Are you having pain? Yes: NPRS scale: 0 currently, /10 Pain location: side of shoulder Pain description: sometimes sharp Aggravating factors: moving arm a certain way Relieving factors: rest  PRECAUTIONS: Fall  WEIGHT BEARING RESTRICTIONS: No  FALLS:  Has patient fallen in last 6 months? Yes. Number of falls 4  LIVING ENVIRONMENT: Lives with: lives with their family Lives in: House/apartment  OCCUPATION: Retired  PLOF: Independent  PATIENT GOALS:Pt unable to provide goals; daughter would like pt to return to [facility]  NEXT MD VISIT:   OBJECTIVE:   DIAGNOSTIC FINDINGS:  04/14/22 L shoulder x-ray IMPRESSION: 1. Degenerative changes as above. 2. The humeral head is high riding on the transscapular Y-view suggesting a possible rotator cuff tear.  PATIENT SURVEYS:  Quick Dash 61.4%  COGNITION: Overall  cognitive status:  History of dementia/alzheimer's. Difficulty following complex commands or answering more complex questions     SENSATION: Pt does not complain of any sensory issues  POSTURE: Forward head, rounded shoulders  UPPER EXTREMITY ROM:   Active ROM Right eval Left eval Left 5/22  Shoulder flexion 120 133 135 no pain  Shoulder extension 50 38 48 no pain  Shoulder abduction 110 110 110  Shoulder adduction     Shoulder internal rotation  Reaches L5 L5  Shoulder external rotation  Reaches C7 C7  Elbow flexion     Elbow extension     Wrist flexion     Wrist extension     Wrist ulnar deviation     Wrist radial deviation     Wrist pronation     Wrist supination     (Blank rows = not tested)  UPPER EXTREMITY MMT:  MMT Right eval Left eval  Shoulder flexion 4+ 4+  Shoulder extension    Shoulder abduction 4+ 4+  Shoulder adduction    Shoulder internal rotation 3+ 3+  Shoulder external rotation 3+ 3  Middle trapezius    Lower trapezius    Elbow flexion    Elbow extension    Wrist flexion    Wrist extension    Wrist ulnar deviation    Wrist radial deviation    Wrist pronation    Wrist supination    Grip strength (lbs) 50, 35, 30 45, 43, 50  (Blank rows = not tested)  SHOULDER SPECIAL TESTS: Impingement tests: Painful arc test: negative SLAP lesions: Biceps load test: negative Instability tests: Apprehension test: negative Rotator cuff assessment: Drop arm test: negative and Empty can test: negative Biceps assessment: Speed's test: negative  JOINT MOBILITY TESTING:  WFL  PALPATION:  No overt tenderness to palpation   [facility] Adult PT Treatment:  DATE: 06/30/22 Therapeutic Exercise: L2 UBE 2 min fwd, 2 min bwd Shoulder flexion and abd wall slide x10 each Shoulder abd/add wall slide x10 Shoulder ER/IR small range on wall x10 S/L shoulder ER 2x10 1# with PT assist Supine shoulder ER at 60 deg abd  2x10 Supine shoulder ER at 90 deg abd x8 (limited due to pt fatigue) Seated scap squeeze 2x10 Clap hands above head x10 Therapeutic Activity: Rechecked ROM   [facility] Adult PT Treatment:                                                DATE: 06/16/22 Therapeutic Exercise: Nustep L6 x 5 min UEs/LEs Pulleys flexion, abduction, horizontal abd x1 min Shoulder ER yellow TB 2x10 Shoulder scaption 2# x10 R&L Rows red TB 2x10 Shoulder ext red TB 2x10 Shoulder IR red TB 2x10 Cones to first shelf of cabinet Shoulder IR with towel horizontal and then vertical 2x10 each                                                                                                                                        DATE: 06/08/22 Unable to initiate HEP due to truncated session   PATIENT EDUCATION: Education details: Exam findings, POC Person educated: Patient and Child(ren) Education method: Explanation and Demonstration Education comprehension: verbalized understanding and needs further education  HOME EXERCISE PROGRAM: Access Code: [email or web address] URL: [email or web address] Date: 06/16/2022 Prepared by: Vernon Prey April Kirstie Peri  Exercises - Shoulder External Rotation and Scapular Retraction  - 1 x daily - 7 x weekly - 2 sets - 10 reps - Single Arm Shoulder Flexion with Dumbbell  - 1 x daily - 7 x weekly - 1 sets - 10 reps - Standing Shoulder Internal Rotation AAROM Behind Back with Towel   - 1 x daily - 7 x weekly - 2 sets - 10 reps  ASSESSMENT:  CLINICAL IMPRESSION: Continued to work on improving pt strength and ROM. Pt has met STG #1.   From eval: Patient is a 82 y.o. F who was seen today for physical therapy evaluation and treatment for L shoulder weakness and pain. Pt did receive an injection in her shoulder in March. PMH significant for dementia/alzheimer's with difficulty following complex commands and answering complex questions. Assessment slightly limited due to her cognitive status.  Pt did not demonstrate any overt shoulder pain during AROM except slightly with extension and IR. Pt demos primarily IR/ER weakness in bilat shoulders. Pt does demonstrate avoidance of using L UE worrisome for increased weakness over time and learned disuse. Pt would benefit from short course of PT for strength and prevention of disuse of L UE.   OBJECTIVE IMPAIRMENTS: decreased activity tolerance, decreased ROM, decreased strength, impaired UE functional use, improper body mechanics, postural  dysfunction, and pain.    GOALS: Goals reviewed with patient? Yes  SHORT TERM GOALS: Target date: 06/22/2022   Pt will maintain L shoulder AROM with no pain Baseline: Goal status: INITIAL   LONG TERM GOALS: Target date: 07/06/2022   Pt will be able to reach behind her back without complaint of pain for dressing and bathing tasks Baseline:  Goal status: INITIAL  2.  Pt will be able to lift and carry a 5# bag in her L UE to demo improving use and mobility Baseline:  Goal status: INITIAL  3.  Daughter will report full return of pt's PLOF with her L UE use Baseline:  Goal status: INITIAL  4.  Pt will have improved Quick DASH to </=51.4% to demo MCID Baseline:  Goal status: INITIAL   PLAN:  PT FREQUENCY: 2x/week  PT DURATION: 4 weeks  PLANNED INTERVENTIONS: Therapeutic exercises, Therapeutic activity, Neuromuscular re-education, Balance training, Gait training, Patient/Family education, Self Care, Joint mobilization, Electrical stimulation, Cryotherapy, Moist heat, Taping, Ionotophoresis 4mg /ml Dexamethasone, Manual therapy, and Re-evaluation  PLAN FOR NEXT SESSION: Initiate HEP working on gross L RTC and periscapular strenthening. Encourage functional L UE use (I.e. holding/lifting bags)   Jamise Pentland April Ma L Ivalene Platte, PT 06/30/2022, 8:12 AM

## 2022-07-02 ENCOUNTER — Ambulatory Visit: Payer: Medicaid Other | Admitting: Rehabilitative and Restorative Service Providers"

## 2022-07-21 ENCOUNTER — Telehealth: Payer: Medicaid Other | Admitting: Family

## 2022-07-21 NOTE — Telephone Encounter (Signed)
[employer] [place] medicaid request for prior approval CMN/PA along with [facility] : supplies requested forms completed  for raised toilet eat and incontinence.please fax forms as requested.

## 2022-07-22 NOTE — Telephone Encounter (Signed)
Form was faxed 07/21/22

## 2022-07-30 ENCOUNTER — Other Ambulatory Visit: Payer: Self-pay

## 2022-07-30 DIAGNOSIS — I10 Essential (primary) hypertension: Secondary | ICD-10-CM

## 2022-07-30 DIAGNOSIS — E785 Hyperlipidemia, unspecified: Secondary | ICD-10-CM

## 2022-07-30 DIAGNOSIS — E1122 Type 2 diabetes mellitus with diabetic chronic kidney disease: Secondary | ICD-10-CM

## 2022-07-30 DIAGNOSIS — F411 Generalized anxiety disorder: Secondary | ICD-10-CM

## 2022-07-30 MED ORDER — SIMVASTATIN 10 MG PO TABS: 10.0000 mg | ORAL_TABLET | Freq: Every day | ORAL | 1 refills | Status: DC

## 2022-07-30 MED ORDER — ASPIRIN 81 MG PO CHEW
81.0000 mg | CHEWABLE_TABLET | Freq: Every day | ORAL | 11 refills | Status: DC
Start: 2022-07-30 — End: 2023-10-26

## 2022-07-30 MED ORDER — GLIPIZIDE 5 MG PO TABS: 5.0000 mg | ORAL_TABLET | Freq: Every day | ORAL | 1 refills | Status: DC

## 2022-07-30 MED ORDER — LOSARTAN POTASSIUM 50 MG PO TABS: 50.0000 mg | ORAL_TABLET | Freq: Every day | ORAL | 1 refills | Status: DC

## 2022-07-30 MED ORDER — AMLODIPINE BESYLATE 10 MG PO TABS: 10.0000 mg | ORAL_TABLET | Freq: Every day | ORAL | 1 refills | Status: DC

## 2022-07-30 MED ORDER — LORAZEPAM 0.5 MG PO TABS: 0.5000 mg | ORAL_TABLET | Freq: Two times a day (BID) | ORAL | 0 refills | Status: DC | PRN

## 2022-07-30 NOTE — Telephone Encounter (Signed)
Patient's daughter Rinaldo Cloud) called for a refill on for her mother Amlodipine 10 MG, Glipizide 5 mg, Losartan 50 MG, Simvastatin 10 MG and medications was send into pharmacy.  While speaking with the daughter she requested me to send in all medications and did not want to give me the name of the medications but finally gave me 4 names of the medications. She was advised that the names of the medications is needed what current medications patient is taking and asked did she have the medications in front of her and she that we have to make sure that medications are not d/c for patient or we not sending in the same medications for the patients. Patient became frustrated and she was advised again that we will need to know the name of all the medications that patient is currently out of and taking to send into pharmacy just incase any medications is d/c or patient not taking. She than asked what was my name and it was stated and spelled for her then she stated she would like to speak with someone else and call was transferred to 2 other CMA's in the office.

## 2022-08-17 ENCOUNTER — Telehealth: Payer: Self-pay

## 2022-08-17 ENCOUNTER — Telehealth: Payer: Self-pay | Admitting: Family

## 2022-08-17 NOTE — Telephone Encounter (Signed)
[name] changed one answer on previous CAP form and added ICD-10 diagnoses codes to form, and then I emailed form to CAP program ([email or web address]) and to daughter, Rinaldo Cloud ([email or web address]). Sent new completed form to [employer] on 08/17/22.

## 2022-08-17 NOTE — Telephone Encounter (Signed)
Requested changes on CAPS form completed.Please fax as requested.forms given to tracy. Please print diagnosis and attach to form.

## 2022-08-17 NOTE — Telephone Encounter (Signed)
Patient daughter called and states that form for CAPS was filled out incorrectly and needs to be faxed back to company because they never received it.

## 2022-08-18 ENCOUNTER — Telehealth: Payer: Self-pay | Admitting: Family

## 2022-08-18 DIAGNOSIS — F411 Generalized anxiety disorder: Secondary | ICD-10-CM | POA: Insufficient documentation

## 2022-08-18 DIAGNOSIS — R32 Unspecified urinary incontinence: Secondary | ICD-10-CM | POA: Insufficient documentation

## 2022-08-18 DIAGNOSIS — I5032 Chronic diastolic (congestive) heart failure: Secondary | ICD-10-CM | POA: Insufficient documentation

## 2022-08-18 DIAGNOSIS — G301 Alzheimer's disease with late onset: Secondary | ICD-10-CM | POA: Insufficient documentation

## 2022-08-18 DIAGNOSIS — E1122 Type 2 diabetes mellitus with diabetic chronic kidney disease: Secondary | ICD-10-CM | POA: Insufficient documentation

## 2022-08-18 DIAGNOSIS — E785 Hyperlipidemia, unspecified: Secondary | ICD-10-CM | POA: Insufficient documentation

## 2022-08-18 DIAGNOSIS — G8929 Other chronic pain: Secondary | ICD-10-CM | POA: Insufficient documentation

## 2022-08-18 DIAGNOSIS — M1A9XX0 Chronic gout, unspecified, without tophus (tophi): Secondary | ICD-10-CM | POA: Insufficient documentation

## 2022-08-18 NOTE — Telephone Encounter (Signed)
Please print problem list with ICD code for Dementia G30.1, F02.80 to be faxed or emailed to [employer] as requested.see visit note from march ,2024.

## 2022-08-18 NOTE — Telephone Encounter (Signed)
Diagnosis list printed and given to Heath Gold, Administrative assistant to fax.

## 2022-08-18 NOTE — Telephone Encounter (Signed)
Emailed CAPS program and patient's daughter, Rinaldo Cloud, revised form completed by [name] and list of diagnoses codes. Daughter called back to say that dementia and Alzheimer's was not listed in the diagnoses codes and this is what she needs for the CAPS program. Asked [name] to add these codes on the form so I can resend to daughter and CAPS program.

## 2022-08-19 ENCOUNTER — Telehealth: Payer: Self-pay | Admitting: Family

## 2022-08-19 NOTE — Telephone Encounter (Signed)
Problem list printed and given to Heath Gold, Administrative assistant to fax.

## 2022-08-19 NOTE — Telephone Encounter (Signed)
Emailed revised problem list including ICD-10 codes to include dementia/Alzheimer's to CAPS program and daughter, Rinaldo Cloud.

## 2022-08-22 ENCOUNTER — Other Ambulatory Visit: Payer: Self-pay | Admitting: Family

## 2022-08-22 DIAGNOSIS — I10 Essential (primary) hypertension: Secondary | ICD-10-CM

## 2022-08-31 ENCOUNTER — Encounter: Payer: Self-pay | Admitting: Family

## 2022-08-31 ENCOUNTER — Ambulatory Visit (INDEPENDENT_AMBULATORY_CARE_PROVIDER_SITE_OTHER): Payer: Medicaid Other | Admitting: Family

## 2022-08-31 VITALS — BP 130/87 | HR 55 | Temp 97.4°F | Resp 18 | Ht 62.5 in | Wt 206.0 lb

## 2022-08-31 DIAGNOSIS — E785 Hyperlipidemia, unspecified: Secondary | ICD-10-CM | POA: Diagnosis not present

## 2022-08-31 DIAGNOSIS — I10 Essential (primary) hypertension: Secondary | ICD-10-CM | POA: Diagnosis not present

## 2022-08-31 DIAGNOSIS — I5032 Chronic diastolic (congestive) heart failure: Secondary | ICD-10-CM | POA: Diagnosis not present

## 2022-08-31 DIAGNOSIS — G301 Alzheimer's disease with late onset: Secondary | ICD-10-CM

## 2022-08-31 DIAGNOSIS — N184 Chronic kidney disease, stage 4 (severe): Secondary | ICD-10-CM

## 2022-08-31 DIAGNOSIS — E1122 Type 2 diabetes mellitus with diabetic chronic kidney disease: Secondary | ICD-10-CM | POA: Diagnosis not present

## 2022-08-31 DIAGNOSIS — F411 Generalized anxiety disorder: Secondary | ICD-10-CM

## 2022-08-31 DIAGNOSIS — F0280 Dementia in other diseases classified elsewhere without behavioral disturbance: Secondary | ICD-10-CM

## 2022-08-31 LAB — CBC WITH DIFFERENTIAL/PLATELET
Absolute Monocytes: 438 cells/uL (ref 200–950)
Eosinophils Absolute: 12 cells/uL — ABNORMAL LOW (ref 15–500)
HCT: 35.9 % (ref 35.0–45.0)
Hemoglobin: 11.7 g/dL (ref 11.7–15.5)
MCHC: 32.6 g/dL (ref 32.0–36.0)
MCV: 94 fL (ref 80.0–100.0)
MPV: 9 fL (ref 7.5–12.5)
Monocytes Relative: 7.3 %
RBC: 3.82 10*6/uL (ref 3.80–5.10)
RDW: 12.4 % (ref 11.0–15.0)
Total Lymphocyte: 46.1 %

## 2022-08-31 MED ORDER — DIVALPROEX SODIUM 125 MG PO DR TAB: 125.0000 mg | DELAYED_RELEASE_TABLET | Freq: Two times a day (BID) | ORAL | 3 refills | Status: DC

## 2022-08-31 NOTE — Progress Notes (Signed)
Provider: Richarda Blade FNP-C   Earlisha Sharples, Donalee Citrin, NP  Patient Care Team: Jaymon Dudek, Donalee Citrin, NP as PCP - General (Family Medicine)  Extended Emergency Contact Information Primary Emergency Contact: Dorthia, Tout Home Phone: [phone] Mobile Phone: [phone] Relation: Daughter Secondary Emergency Contact: Randa Lynn Home Phone: [phone] Mobile Phone: [phone] Relation: Daughter  Code Status:  Full Code  Goals of care: Advanced Directive information    06/08/2022    8:17 AM  Advanced Directives  Does Patient Have a Medical Advance Directive? No  Would patient like information on creating a medical advance directive? No - Patient declined     Chief Complaint  Patient presents with   Medical Management of Chronic Issues    Patient is here for a 38M F/U    HPI:  Pt is a 82 y.o. female seen today for 3 months follow up for medical management of chronic diseases.    Type 2 DM - previous glucose 109,A1C 6.5 slightly higher than previous 5.9   Hypertension - No home B/p for review.she denies any headache,dizziness,vision changes,fatigue,chest tightness,palpitation,chest pain or shortness of breath.     Hyperlipidemia - previous chol,TRG and LDL were within normal range but were slight higher compared to previous.   Generalized anxiety - continues to require Lorazepam.Daughter states has   Dementia - has had increased agitation mostly verbal abusive towards daughter. Sometimes fights when she tries to redirect or reminds her to go to bed.Tends to be more agitated when her neighbor's children are too loud.Daughter has tried to decorate her porch with bright colored artificial flowers which she enjoys and helps with her agitation but was given a notification by her apartment manage to take them down per apartment contract.will need a letter to keep flowers to help with her visual sensory due to dementia.  She requires assistance with her ADL's. Enjoys  activities at Adult day program.  Appetite described as good.No significant weight loss.   Past Medical History:  Diagnosis Date   Anterolisthesis    Grade 1 of L3 on L4 on x-ray 07/18/19   Aortic regurgitation    Seen on 2D echo 07/07/16   Cataract    CKD (chronic kidney disease), stage III (HCC)    DDD (degenerative disc disease), lumbar    Diabetes mellitus without complication (HCC)    Diastolic CHF (HCC)    Grade 2/4 with LVEF of 60-65%   Diastolic dysfunction    Grade 2/4 with LVEF of 60-65% and LVH   Gout    HLD (hyperlipidemia)    Hyperparathyroidism (HCC)    Hypertension    Lumbar facet arthropathy    Memory impairment    Mitral valve regurgitation    Seen on 2D echo 07/07/16   OA (osteoarthritis)    OSA (obstructive sleep apnea)    Osteoarthritis    Polycythemia    Shingles    Sleep apnea    Thrombocytopenia (HCC)    Vitamin D deficiency    Past Surgical History:  Procedure Laterality Date   LAPAROSCOPIC HYSTERECTOMY      Allergies  Allergen Reactions   Augmentin [Amoxicillin-Pot Clavulanate] Rash    Critically high - Onset 07/13/2019   Amoxicillin     Did it involve swelling of the face/tongue/throat, SOB, or low BP? Y Did it involve sudden or severe rash/hives, skin peeling, or any reaction on the inside of your mouth or nose? Y (Rash, Swelling, SOB) Did you need to seek medical attention at a hospital or doctor's  office? Y When did it last happen?  Yesterday     If all above answers are "NO", may proceed with cephalosporin use.    Shellfish Allergy     Allergies as of 08/31/2022       Reactions   Augmentin [amoxicillin-pot Clavulanate] Rash   Critically high - Onset 07/13/2019   Amoxicillin    Did it involve swelling of the face/tongue/throat, SOB, or low BP? Y Did it involve sudden or severe rash/hives, skin peeling, or any reaction on the inside of your mouth or nose? Y (Rash, Swelling, SOB) Did you need to seek medical attention at a hospital or  doctor's office? Y When did it last happen?  Yesterday     If all above answers are "NO", may proceed with cephalosporin use.   Shellfish Allergy         Medication List        Accurate as of August 31, 2022  3:47 PM. If you have any questions, ask your nurse or doctor.          allopurinol 100 MG tablet Commonly known as: ZYLOPRIM Take 1 tablet (100 mg total) by mouth daily.   amLODipine 10 MG tablet Commonly known as: NORVASC Take 1 tablet (10 mg total) by mouth daily.   aspirin 81 MG chewable tablet Chew 1 tablet (81 mg total) by mouth daily.   azelastine 0.1 % nasal spray Commonly known as: ASTELIN Place 1 spray into both nostrils 2 (two) times daily. Use in each nostril as directed   blood glucose meter kit and supplies Kit Dispense based on patient and insurance preference. Use up to four times daily as directed.   divalproex 125 MG DR tablet Commonly known as: DEPAKOTE Take 1 tablet (125 mg total) by mouth at bedtime.   EPINEPHrine 0.3 mg/0.3 mL Soaj injection Commonly known as: EpiPen 2-Pak Inject 0.3 mg into the muscle as needed for anaphylaxis.   fluticasone 50 MCG/ACT nasal spray Commonly known as: FLONASE USE 1 SPRAY(S) IN EACH NOSTRIL TWICE DAILY AS NEEDED FOR ALLERGIES   glipiZIDE 5 MG tablet Commonly known as: GLUCOTROL Take 1 tablet (5 mg total) by mouth daily.   glucose blood test strip 1 each by Other route as needed for other. Use as instructed   guaiFENesin 600 MG 12 hr tablet Commonly known as: MUCINEX Take 1 tablet (600 mg total) by mouth 2 (two) times daily as needed.   hydrALAZINE 25 MG tablet Commonly known as: APRESOLINE TAKE 1 TABLET BY MOUTH THREE TIMES DAILY   ipratropium 0.06 % nasal spray Commonly known as: ATROVENT Place 2 sprays into both nostrils 4 (four) times daily.   Lancets Ultra Thin 30G Misc Inject 1 each into the skin daily.   loratadine 10 MG tablet Commonly known as: CLARITIN Take 1 tablet (10 mg total)  by mouth daily as needed for allergies.   LORazepam 0.5 MG tablet Commonly known as: ATIVAN Take 1 tablet (0.5 mg total) by mouth 2 (two) times daily as needed. for anxiety   losartan 50 MG tablet Commonly known as: COZAAR Take 1 tablet (50 mg total) by mouth daily.   nystatin cream Commonly known as: MYCOSTATIN Apply 1 Application topically 2 (two) times daily.   simvastatin 10 MG tablet Commonly known as: ZOCOR Take 1 tablet (10 mg total) by mouth at bedtime.        Review of Systems  Unable to perform ROS: Dementia (HPI information provided by patient's daughter present during  visit.)    Immunization History  Administered Date(s) Administered   Fluad Quad(high Dose 65+) 11/08/2019   Influenza, High Dose Seasonal PF 10/03/2021   Influenza-Unspecified 10/31/2018, 10/16/2020   Moderna Sars-Covid-2 Vaccination 04/25/2019, 05/23/2019, 12/25/2019   Pneumococcal Conjugate-13 05/06/2015   Pneumococcal Polysaccharide-23 10/30/2013   Tdap 12/31/2013   Zoster Recombinant(Shingrix) 10/16/2020   Pertinent  Health Maintenance Due  Topic Date Due   FOOT EXAM  Never done   OPHTHALMOLOGY EXAM  11/29/2018   INFLUENZA VACCINE  09/09/2022   HEMOGLOBIN A1C  10/15/2022   DEXA SCAN  Completed      08/07/2020    9:06 AM 03/24/2021    9:34 AM 09/28/2021    2:43 PM 10/02/2021    2:02 PM 04/14/2022   10:15 AM  Fall Risk  Falls in the past year? 0 0 0 0 1  Was there an injury with Fall? 0 0 0 0 1  Fall Risk Category Calculator 0 0 0 0 3  Fall Risk Category (Retired) Low Low Low Low   (RETIRED) Patient Fall Risk Level Low fall risk Low fall risk  Low fall risk   Patient at Risk for Falls Due to No Fall Risks No Fall Risks No Fall Risks No Fall Risks History of fall(s)  Fall risk Follow up Falls evaluation completed Falls evaluation completed Falls evaluation completed Falls evaluation completed    Functional Status Survey:    Vitals:   08/31/22 1536  BP: 130/87  Pulse: (!) 55   Resp: 18  Temp: (!) 97.4 F (36.3 C)  SpO2: 98%  Weight: 206 lb (93.4 kg)  Height: 5' 2.5" (1.588 m)   Body mass index is 37.08 kg/m. Physical Exam Vitals reviewed.  Constitutional:      General: She is not in acute distress.    Appearance: Normal appearance. She is normal weight. She is not ill-appearing or diaphoretic.  HENT:     Head: Normocephalic.     Right Ear: Tympanic membrane, ear canal and external ear normal. There is no impacted cerumen.     Left Ear: Tympanic membrane, ear canal and external ear normal. There is no impacted cerumen.     Nose: Nose normal. No congestion or rhinorrhea.     Mouth/Throat:     Mouth: Mucous membranes are moist.     Pharynx: Oropharynx is clear. No oropharyngeal exudate or posterior oropharyngeal erythema.  Eyes:     General: No scleral icterus.       Right eye: No discharge.        Left eye: No discharge.     Extraocular Movements: Extraocular movements intact.     Conjunctiva/sclera: Conjunctivae normal.     Pupils: Pupils are equal, round, and reactive to light.  Neck:     Vascular: No carotid bruit.  Cardiovascular:     Rate and Rhythm: Normal rate and regular rhythm.     Pulses: Normal pulses.     Heart sounds: Normal heart sounds. No murmur heard.    No friction rub. No gallop.  Pulmonary:     Effort: Pulmonary effort is normal. No respiratory distress.     Breath sounds: Normal breath sounds. No wheezing, rhonchi or rales.  Chest:     Chest wall: No tenderness.  Abdominal:     General: Bowel sounds are normal. There is no distension.     Palpations: Abdomen is soft. There is no mass.     Tenderness: There is no abdominal tenderness. There is no  right CVA tenderness, left CVA tenderness, guarding or rebound.  Musculoskeletal:        General: No swelling or tenderness. Normal range of motion.     Cervical back: Normal range of motion. No rigidity or tenderness.     Right lower leg: No edema.     Left lower leg: No edema.   Lymphadenopathy:     Cervical: No cervical adenopathy.  Skin:    General: Skin is warm and dry.     Coloration: Skin is not pale.     Findings: No bruising, erythema, lesion or rash.  Neurological:     Mental Status: She is alert and oriented to person, place, and time.     Cranial Nerves: No cranial nerve deficit.     Sensory: No sensory deficit.     Motor: No weakness.     Coordination: Coordination normal.     Gait: Gait abnormal.  Psychiatric:        Mood and Affect: Mood normal.        Speech: Speech normal.        Behavior: Behavior normal.        Cognition and Memory: Cognition is impaired. Memory is impaired.        Judgment: Judgment normal.     Comments: Picking on her fingernails throughout the visit     Labs reviewed: Recent Labs    09/25/21 0803 10/02/21 1450 04/14/22 1331  NA 140 140 140  K 5.4* 4.4 4.9  CL 111* 110 109  CO2 20 20 22   GLUCOSE 115* 111 109*  BUN 37* 35* 32*  CREATININE 1.85* 1.66* 1.37*  CALCIUM 9.6 9.3 10.0   Recent Labs    09/25/21 0803 04/14/22 1331  AST 22 22  ALT 24 25  BILITOT 0.3 0.4  PROT 6.9 7.7   Recent Labs    09/25/21 0803 04/14/22 1331  WBC 5.4 5.6  NEUTROABS 2,576 2,470  HGB 12.1 11.9  HCT 36.7 36.0  MCV 95.8 94.0  PLT 272 346   Lab Results  Component Value Date   TSH 2.01 04/14/2022   Lab Results  Component Value Date   HGBA1C 6.5 (H) 04/14/2022   Lab Results  Component Value Date   CHOL 174 04/14/2022   HDL 65 04/14/2022   LDLCALC 89 04/14/2022   TRIG 105 04/14/2022   CHOLHDL 2.7 04/14/2022    Significant Diagnostic Results in last 30 days:  No results found.  Assessment/Plan  1. Type 2 diabetes mellitus with stage 4 chronic kidney disease, without long-term current use of insulin (HCC) Lab Results  Component Value Date   HGBA1C 6.5 (H) 04/14/2022  -No home CBG for review.  No signs of hypo or hyperglycemia reported. -Continue on glipizide - Hemoglobin A1c  2. Essential  hypertension -Blood pressure at goal -Continue on losartan, hydralazine and amlodipine -Continue on aspirin and simvastatin for cardiovascular event prevention - TSH - COMPLETE METABOLIC PANEL WITH GFR - CBC with Differential/Platelet  3. Chronic diastolic congestive heart failure (HCC) No signs of fluid overload -Continue to monitor weight  4. Hyperlipidemia LDL goal <70 Previous LDL at goal -Continue on simvastatin - Lipid panel  5. Late onset Alzheimer's dementia without behavioral disturbance (HCC) Has been physically and verbally combative towards daughter. - visual sensory activities and bright flowers helps to calm her down.POA has put up bright purple flowers on the porch which she enjoys but has been given a noticed to remove them by the apartment manager.request  a letter to indicate to excuse from removal since it helps patient's cognitive and calms her down. Letter written and given during visit.  -Increase Depakote from 125 mg at bedtime to twice daily.  Continue to monitor if still agitated consider increasing Depakote to 250 milligrams at bedtime and 125 mg  in the morning -Continue with supportive care - divalproex (DEPAKOTE) 125 MG DR tablet; Take 1 tablet (125 mg total) by mouth 2 (two) times daily.  Dispense: 60 tablet; Refill: 3  6. Generalized anxiety disorder Continue on lorazepam  Family/ staff Communication: Reviewed plan of care with patient and daughter verbalized understanding  Labs/tests ordered:  - TSH - COMPLETE METABOLIC PANEL WITH GFR - CBC with Differential/Platelet - Lipid panel - Hemoglobin A1c  Next Appointment : Return in about 6 months (around 03/03/2023) for medical mangement of chronic issues.Caesar Bookman, NP

## 2022-09-01 LAB — COMPLETE METABOLIC PANEL WITH GFR
AG Ratio: 1.3 (calc) (ref 1.0–2.5)
ALT: 16 U/L (ref 6–29)
AST: 17 U/L (ref 10–35)
Albumin: 3.9 g/dL (ref 3.6–5.1)
Alkaline phosphatase (APISO): 73 U/L (ref 37–153)
BUN/Creatinine Ratio: 21 (calc) (ref 6–22)
BUN: 34 mg/dL — ABNORMAL HIGH (ref 7–25)
CO2: 25 mmol/L (ref 20–32)
Calcium: 9.5 mg/dL (ref 8.6–10.4)
Chloride: 110 mmol/L (ref 98–110)
Creat: 1.64 mg/dL — ABNORMAL HIGH (ref 0.60–0.95)
Globulin: 3.1 g/dL (calc) (ref 1.9–3.7)
Glucose, Bld: 84 mg/dL (ref 65–139)
Potassium: 4.8 mmol/L (ref 3.5–5.3)
Sodium: 143 mmol/L (ref 135–146)
Total Bilirubin: 0.3 mg/dL (ref 0.2–1.2)
Total Protein: 7 g/dL (ref 6.1–8.1)
eGFR: 31 mL/min/{1.73_m2} — ABNORMAL LOW (ref >=60)

## 2022-09-01 LAB — LIPID PANEL
Cholesterol: 161 mg/dL (ref <200)
HDL: 64 mg/dL (ref >=50)
LDL Cholesterol (Calc): 83 mg/dL (calc)
Non-HDL Cholesterol (Calc): 97 mg/dL (calc) (ref <130)
Total CHOL/HDL Ratio: 2.5 (calc) (ref <5.0)
Triglycerides: 60 mg/dL (ref <150)

## 2022-09-01 LAB — CBC WITH DIFFERENTIAL/PLATELET
Basophils Absolute: 30 cells/uL (ref 0–200)
Basophils Relative: 0.5 %
Eosinophils Relative: 0.2 %
Lymphs Abs: 2766 cells/uL (ref 850–3900)
MCH: 30.6 pg (ref 27.0–33.0)
Neutro Abs: 2754 cells/uL (ref 1500–7800)
Neutrophils Relative %: 45.9 %
Platelets: 279 10*3/uL (ref 140–400)
WBC: 6 10*3/uL (ref 3.8–10.8)

## 2022-09-01 LAB — HEMOGLOBIN A1C
Hgb A1c MFr Bld: 6.7 % of total Hgb — ABNORMAL HIGH (ref <5.7)
Mean Plasma Glucose: 146 mg/dL
eAG (mmol/L): 8.1 mmol/L

## 2022-09-01 LAB — TSH: TSH: 1.42 mIU/L (ref 0.40–4.50)

## 2022-09-05 ENCOUNTER — Other Ambulatory Visit: Payer: Self-pay | Admitting: Adult Health

## 2022-09-05 DIAGNOSIS — F411 Generalized anxiety disorder: Secondary | ICD-10-CM

## 2022-09-06 NOTE — Telephone Encounter (Signed)
Patient has request refill on medication Lorazepam. Patient has contract on file dated 09/02/2022.

## 2022-09-30 ENCOUNTER — Telehealth: Payer: Self-pay

## 2022-09-30 NOTE — Telephone Encounter (Signed)
Patient's daughter walked in office and dropped of an example of an FL2 form and what information needs to be filled out stating that she needed an FL2 form filled out. Make sure number 17 is filled out. FL2  forms started and placed on desk of [name] Ngetich,NP in review and sign folder.

## 2022-10-01 NOTE — Telephone Encounter (Signed)
F L 2 form completed and signed.please fax as requested

## 2022-10-04 NOTE — Telephone Encounter (Signed)
FL2 form faxed as requested.

## 2022-10-21 ENCOUNTER — Other Ambulatory Visit: Payer: Self-pay | Admitting: Family

## 2022-10-21 DIAGNOSIS — F411 Generalized anxiety disorder: Secondary | ICD-10-CM

## 2022-11-30 ENCOUNTER — Other Ambulatory Visit: Payer: Self-pay | Admitting: Family

## 2022-11-30 DIAGNOSIS — I10 Essential (primary) hypertension: Secondary | ICD-10-CM

## 2022-11-30 DIAGNOSIS — F411 Generalized anxiety disorder: Secondary | ICD-10-CM

## 2022-12-09 ENCOUNTER — Telehealth: Payer: Medicaid Other | Admitting: Family

## 2022-12-09 NOTE — Progress Notes (Signed)
This encounter was created in error - please disregard. CMA's unable to reach patient.

## 2022-12-23 ENCOUNTER — Encounter (INDEPENDENT_AMBULATORY_CARE_PROVIDER_SITE_OTHER): Payer: Medicaid Other | Admitting: Family

## 2022-12-29 ENCOUNTER — Other Ambulatory Visit: Payer: Self-pay | Admitting: Family

## 2022-12-29 DIAGNOSIS — F411 Generalized anxiety disorder: Secondary | ICD-10-CM

## 2022-12-29 NOTE — Progress Notes (Signed)
  This encounter was created in error - please disregard. No show 

## 2022-12-30 NOTE — Telephone Encounter (Signed)
Patient has request refill on medication Lorazepam. Patient last refill date 11/30/2022. Patient has Non Opioid Contract on file dated 09/02/2022. Patient medication pend and sent to Abbey Chatters, NP due to PCP Ngetich, Donalee Citrin, NP being out of office.

## 2023-01-04 ENCOUNTER — Encounter (INDEPENDENT_AMBULATORY_CARE_PROVIDER_SITE_OTHER): Payer: Medicaid Other | Admitting: Family

## 2023-01-05 NOTE — Progress Notes (Signed)
  This encounter was created in error - please disregard. No show 

## 2023-01-16 ENCOUNTER — Other Ambulatory Visit: Payer: Self-pay | Admitting: Nurse Practitioner

## 2023-01-16 DIAGNOSIS — F411 Generalized anxiety disorder: Secondary | ICD-10-CM

## 2023-01-17 NOTE — Telephone Encounter (Signed)
Patient is requesting a refill of the following medications: Requested Prescriptions   Pending Prescriptions Disp Refills   LORazepam (ATIVAN) 0.5 MG tablet [Pharmacy Med Name: LORazepam 0.5 MG Oral Tablet] 60 tablet 5    Sig: Take 1 tablet by mouth twice daily as needed for anxiety    Date of last refill: 12/30/2022  Refill amount: 60/0 refills   Treatment agreement date:  July 2024

## 2023-01-24 ENCOUNTER — Other Ambulatory Visit: Payer: Self-pay | Admitting: Family

## 2023-01-24 DIAGNOSIS — G301 Alzheimer's disease with late onset: Secondary | ICD-10-CM

## 2023-02-10 ENCOUNTER — Other Ambulatory Visit: Payer: Self-pay | Admitting: Family

## 2023-02-10 DIAGNOSIS — G301 Alzheimer's disease with late onset: Secondary | ICD-10-CM

## 2023-02-10 NOTE — Telephone Encounter (Signed)
 High Risk Warning Populated when attempting to refill, I will send to Provider for further review

## 2023-02-11 ENCOUNTER — Other Ambulatory Visit: Payer: Self-pay | Admitting: Family

## 2023-02-11 ENCOUNTER — Telehealth: Payer: Self-pay | Admitting: Family

## 2023-02-11 DIAGNOSIS — E1122 Type 2 diabetes mellitus with diabetic chronic kidney disease: Secondary | ICD-10-CM

## 2023-02-11 DIAGNOSIS — E785 Hyperlipidemia, unspecified: Secondary | ICD-10-CM

## 2023-02-11 DIAGNOSIS — I10 Essential (primary) hypertension: Secondary | ICD-10-CM

## 2023-02-11 NOTE — Telephone Encounter (Signed)
 Message routed to PCP Ngetich, Donalee Citrin, NP who schedule appointment.

## 2023-02-11 NOTE — Telephone Encounter (Signed)
 Form received from [employer] requesting to be completed.patient last seen 08/2022.vital signs and current weight required.please call and schedule visit to complete form

## 2023-02-18 ENCOUNTER — Encounter: Payer: Self-pay | Admitting: Family

## 2023-02-18 ENCOUNTER — Ambulatory Visit (INDEPENDENT_AMBULATORY_CARE_PROVIDER_SITE_OTHER): Payer: Medicaid Other | Admitting: Family

## 2023-02-18 VITALS — BP 128/82 | HR 50 | Temp 97.8°F | Resp 20 | Ht 62.5 in | Wt 210.4 lb

## 2023-02-18 DIAGNOSIS — N184 Chronic kidney disease, stage 4 (severe): Secondary | ICD-10-CM

## 2023-02-18 DIAGNOSIS — Z23 Encounter for immunization: Secondary | ICD-10-CM

## 2023-02-18 DIAGNOSIS — I10 Essential (primary) hypertension: Secondary | ICD-10-CM | POA: Diagnosis not present

## 2023-02-18 DIAGNOSIS — E785 Hyperlipidemia, unspecified: Secondary | ICD-10-CM

## 2023-02-18 DIAGNOSIS — I5032 Chronic diastolic (congestive) heart failure: Secondary | ICD-10-CM | POA: Diagnosis not present

## 2023-02-18 DIAGNOSIS — F411 Generalized anxiety disorder: Secondary | ICD-10-CM

## 2023-02-18 DIAGNOSIS — F0280 Dementia in other diseases classified elsewhere without behavioral disturbance: Secondary | ICD-10-CM

## 2023-02-18 DIAGNOSIS — M150 Primary generalized (osteo)arthritis: Secondary | ICD-10-CM

## 2023-02-18 DIAGNOSIS — R2681 Unsteadiness on feet: Secondary | ICD-10-CM

## 2023-02-18 DIAGNOSIS — E1122 Type 2 diabetes mellitus with diabetic chronic kidney disease: Secondary | ICD-10-CM | POA: Diagnosis not present

## 2023-02-18 DIAGNOSIS — G301 Alzheimer's disease with late onset: Secondary | ICD-10-CM

## 2023-02-18 DIAGNOSIS — B372 Candidiasis of skin and nail: Secondary | ICD-10-CM

## 2023-02-18 MED ORDER — EPINEPHRINE 0.3 MG/0.3ML IJ SOAJ: 0.3000 mg | INTRAMUSCULAR | 1 refills | Status: AC | PRN

## 2023-02-18 MED ORDER — LANCETS ULTRA THIN 30G MISC: 1.0000 | Freq: Every day | 3 refills | Status: DC

## 2023-02-18 MED ORDER — BLOOD GLUCOSE TEST VI STRP: 1.0000 | ORAL_STRIP | Freq: Three times a day (TID) | 5 refills | Status: AC

## 2023-02-18 MED ORDER — LANCETS MISC. MISC: 1.0000 | Freq: Three times a day (TID) | 5 refills | Status: AC

## 2023-02-18 MED ORDER — NYSTATIN 100000 UNIT/GM EX CREA: 1.0000 | TOPICAL_CREAM | Freq: Two times a day (BID) | CUTANEOUS | 3 refills | Status: DC

## 2023-02-18 MED ORDER — BLOOD GLUCOSE MONITOR KIT: PACK | 5 refills | Status: DC

## 2023-02-18 MED ORDER — LANCET DEVICE MISC: 1.0000 | Freq: Three times a day (TID) | 0 refills | Status: AC

## 2023-02-18 MED ORDER — GLUCOSE BLOOD VI STRP: 1.0000 | ORAL_STRIP | 3 refills | Status: DC | PRN

## 2023-02-18 MED ORDER — BLOOD GLUCOSE MONITORING SUPPL DEVI: 1.0000 | Freq: Three times a day (TID) | 0 refills | Status: AC

## 2023-02-18 NOTE — Progress Notes (Signed)
 Provider: Roxan Plough FNP-C   Liana Camerer, Roxan BROCKS, NP  Patient Care Team: Laylana Gerwig, Roxan BROCKS, NP as PCP - General (Family Medicine)  Extended Emergency Contact Information Primary Emergency Contact: Brittony, Billick Home Phone: [phone] Mobile Phone: [phone] Relation: Daughter Secondary Emergency Contact: Barfield, Delphine Home Phone: [phone] Mobile Phone: [phone] Relation: Daughter  Code Status:  Full Code  Goals of care: Advanced Directive information    06/08/2022    8:17 AM  Advanced Directives  Does Patient Have a Medical Advance Directive? No  Would patient like information on creating a medical advance directive? No - Patient declined     Chief Complaint  Patient presents with   Medical Management of Chronic Issues    Patient presents today for a 6 month follow-up    HPI:  Pt is a 83 y.o. female seen today for 6 months for medical management of chronic diseases. She is here with her daughter who provides additional HPI information. Daughter states needs paperwork filled for patient's adult day program.    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 Daughter requests referral to nephrologist for CKD stage IV.  No home blood sugar readings for review.  Stated no signs of hypo or hyperglycemia.  State was seen by ophthalmologist prior to end of last year.  Stated no recent fall episodes or weight changes.  She continues to require assistance with her ADLs provided by the daughter.  Has been going to adult daycare program.  Due for COVID-19 and Shingrix vaccine aware to get vaccines at the pharmacy.     Past Medical History:  Diagnosis Date   Anterolisthesis    Grade 1 of L3 on L4 on x-ray 07/18/19   Aortic regurgitation     Seen on 2D echo 07/07/16   Cataract    CKD (chronic kidney disease), stage III (HCC)    DDD (degenerative disc disease), lumbar    Diabetes mellitus without complication (HCC)    Diastolic CHF (HCC)    Grade 2/4 with LVEF of 60-65%   Diastolic dysfunction    Grade 2/4 with LVEF of 60-65% and LVH   Gout    HLD (hyperlipidemia)    Hyperparathyroidism (HCC)    Hypertension    Lumbar facet arthropathy    Memory impairment    Mitral valve regurgitation    Seen on 2D echo 07/07/16   OA (osteoarthritis)    OSA (obstructive sleep apnea)    Osteoarthritis    Polycythemia    Shingles    Sleep apnea    Thrombocytopenia (HCC)    Vitamin D deficiency    Past Surgical History:  Procedure Laterality Date   LAPAROSCOPIC HYSTERECTOMY      Allergies  Allergen Reactions   Augmentin [Amoxicillin-Pot Clavulanate] Rash    Critically high - Onset 07/13/2019   Amoxicillin     Did it involve swelling of the face/tongue/throat, SOB, or low BP? Y Did it involve sudden or severe rash/hives, skin peeling, or any reaction on the inside of your mouth or nose? Y (Rash, Swelling, SOB) Did you need to seek medical attention at a hospital or doctor's office? Y When did it last happen?  Yesterday     If all above answers are "NO", may proceed with cephalosporin use.    Shellfish Allergy      Allergies as of 02/18/2023  Reactions   Augmentin [amoxicillin-pot Clavulanate] Rash   Critically high - Onset 07/13/2019   Amoxicillin    Did it involve swelling of the face/tongue/throat, SOB, or low BP? Y Did it involve sudden or severe rash/hives, skin peeling, or any reaction on the inside of your mouth or nose? Y (Rash, Swelling, SOB) Did you need to seek medical attention at a hospital or doctor's office? Y When did it last happen?  Yesterday     If all above answers are "NO", may proceed with cephalosporin use.   Shellfish Allergy          Medication List        Accurate as of February 18, 2023   9:38 AM. If you have any questions, ask your nurse or doctor.          allopurinol  100 MG tablet Commonly known as: ZYLOPRIM  Take 1 tablet (100 mg total) by mouth daily.   amLODipine  10 MG tablet Commonly known as: NORVASC  Take 1 tablet by mouth once daily   aspirin  81 MG chewable tablet Chew 1 tablet (81 mg total) by mouth daily.   azelastine  0.1 % nasal spray Commonly known as: ASTELIN  Place 1 spray into both nostrils 2 (two) times daily. Use in each nostril as directed   blood glucose meter kit and supplies Kit Dispense based on patient and insurance preference. Use up to four times daily as directed.   divalproex  125 MG DR tablet Commonly known as: DEPAKOTE  Take 1 tablet by mouth twice daily   EPINEPHrine  0.3 mg/0.3 mL Soaj injection Commonly known as: EpiPen  2-Pak Inject 0.3 mg into the muscle as needed for anaphylaxis.   fluticasone  50 MCG/ACT nasal spray Commonly known as: FLONASE  USE 1 SPRAY(S) IN EACH NOSTRIL TWICE DAILY AS NEEDED FOR ALLERGIES   glipiZIDE  5 MG tablet Commonly known as: GLUCOTROL  Take 1 tablet by mouth once daily   glucose blood test strip 1 each by Other route as needed for other. Use as instructed   guaiFENesin  600 MG 12 hr tablet Commonly known as: MUCINEX  Take 1 tablet (600 mg total) by mouth 2 (two) times daily as needed.   hydrALAZINE  25 MG tablet Commonly known as: APRESOLINE  TAKE 1 TABLET BY MOUTH THREE TIMES DAILY   ipratropium 0.06 % nasal spray Commonly known as: ATROVENT  Place 2 sprays into both nostrils 4 (four) times daily.   Lancets Ultra Thin 30G Misc Inject 1 each into the skin daily.   loratadine  10 MG tablet Commonly known as: CLARITIN  Take 1 tablet (10 mg total) by mouth daily as needed for allergies.   LORazepam  0.5 MG tablet Commonly known as: ATIVAN  Take 1 tablet by mouth twice daily as needed for anxiety   losartan  50 MG tablet Commonly known as: COZAAR  Take 1 tablet by mouth once daily   nystatin   cream Commonly known as: MYCOSTATIN  Apply 1 Application topically 2 (two) times daily.   simvastatin  10 MG tablet Commonly known as: ZOCOR  TAKE 1 TABLET BY MOUTH AT BEDTIME        Review of Systems  Unable to perform ROS: Dementia (HPI information provided by patient's daughter present during visit)  Constitutional:  Negative for appetite change, chills, fatigue, fever and unexpected weight change.  HENT:  Negative for congestion, ear discharge, ear pain, facial swelling, hearing loss, nosebleeds, postnasal drip, rhinorrhea, sinus pressure, sinus pain, sneezing, sore throat, tinnitus and trouble swallowing.   Eyes:  Negative for pain, discharge, redness, itching and visual disturbance.  Respiratory:  Negative for cough, chest tightness, shortness of breath and wheezing.   Cardiovascular:  Negative for chest pain, palpitations and leg swelling.  Gastrointestinal:  Negative for abdominal distention, abdominal pain, constipation, diarrhea, nausea and vomiting.  Endocrine: Negative for cold intolerance, heat intolerance, polydipsia, polyphagia and polyuria.  Genitourinary:  Negative for difficulty urinating, dysuria, flank pain, frequency and urgency.       Incontinent   Musculoskeletal:  Positive for arthralgias and gait problem. Negative for back pain, joint swelling, myalgias, neck pain and neck stiffness.       Knee pain   Skin:  Negative for color change, pallor, rash and wound.  Neurological:  Negative for dizziness, syncope, speech difficulty, weakness, light-headedness, numbness and headaches.  Hematological:  Does not bruise/bleed easily.  Psychiatric/Behavioral:  Negative for agitation, behavioral problems, confusion, hallucinations and sleep disturbance. The patient is not nervous/anxious.     Immunization History  Administered Date(s) Administered   Fluad Quad(high Dose 65+) 11/08/2019   Fluad Trivalent(High Dose 65+) 02/18/2023   Influenza, High Dose Seasonal PF 10/03/2021    Influenza-Unspecified 10/31/2018, 10/16/2020   Moderna Sars-Covid-2 Vaccination 04/25/2019, 05/23/2019, 12/25/2019   Pneumococcal Conjugate-13 05/06/2015   Pneumococcal Polysaccharide-23 10/30/2013   Tdap 12/31/2013   Zoster Recombinant(Shingrix) 10/16/2020   Pertinent  Health Maintenance Due  Topic Date Due   FOOT EXAM  Never done   OPHTHALMOLOGY EXAM  11/29/2018   HEMOGLOBIN A1C  03/03/2023   INFLUENZA VACCINE  Completed   DEXA SCAN  Completed      08/07/2020    9:06 AM 03/24/2021    9:34 AM 09/28/2021    2:43 PM 10/02/2021    2:02 PM 04/14/2022   10:15 AM  Fall Risk  Falls in the past year? 0 0 0 0 1  Was there an injury with Fall? 0 0 0 0 1  Fall Risk Category Calculator 0 0 0 0 3  Fall Risk Category (Retired) Low Low Low Low   (RETIRED) Patient Fall Risk Level Low fall risk Low fall risk  Low fall risk   Patient at Risk for Falls Due to No Fall Risks No Fall Risks No Fall Risks No Fall Risks History of fall(s)  Fall risk Follow up Falls evaluation completed Falls evaluation completed Falls evaluation completed Falls evaluation completed    Functional Status Survey:    Vitals:   02/18/23 0905  BP: 128/82  Pulse: (!) 50  Resp: 20  Temp: 97.8 F (36.6 C)  SpO2: 99%  Weight: 210 lb 6.4 oz (95.4 kg)  Height: 5' 2.5 (1.588 m)   Body mass index is 37.87 kg/m. Physical Exam Vitals reviewed.  Constitutional:      General: She is not in acute distress.    Appearance: Normal appearance. She is obese. She is not ill-appearing or diaphoretic.  HENT:     Head: Normocephalic.     Right Ear: Tympanic membrane, ear canal and external ear normal. There is no impacted cerumen.     Left Ear: Tympanic membrane, ear canal and external ear normal. There is no impacted cerumen.     Nose: Nose normal. No congestion or rhinorrhea.     Mouth/Throat:     Mouth: Mucous membranes are moist.     Pharynx: Oropharynx is clear. No oropharyngeal exudate or posterior oropharyngeal erythema.   Eyes:     General: No scleral icterus.       Right eye: No discharge.        Left eye: No discharge.  Extraocular Movements: Extraocular movements intact.     Conjunctiva/sclera: Conjunctivae normal.     Pupils: Pupils are equal, round, and reactive to light.  Neck:     Vascular: No carotid bruit.  Cardiovascular:     Rate and Rhythm: Normal rate and regular rhythm.     Pulses: Normal pulses.     Heart sounds: Normal heart sounds. No murmur heard.    No friction rub. No gallop.  Pulmonary:     Effort: Pulmonary effort is normal. No respiratory distress.     Breath sounds: Normal breath sounds. No wheezing, rhonchi or rales.  Chest:     Chest wall: No tenderness.  Abdominal:     General: Bowel sounds are normal. There is no distension.     Palpations: Abdomen is soft. There is no mass.     Tenderness: There is no abdominal tenderness. There is no right CVA tenderness, left CVA tenderness, guarding or rebound.  Musculoskeletal:        General: No swelling or tenderness. Normal range of motion.     Cervical back: Normal range of motion. No rigidity or tenderness.     Right knee: Crepitus present. No swelling, effusion, erythema or ecchymosis. Normal range of motion. No tenderness.     Left knee: Crepitus present. No swelling, effusion, erythema or ecchymosis. Normal range of motion. No tenderness.     Right lower leg: No edema.     Left lower leg: No edema.  Lymphadenopathy:     Cervical: No cervical adenopathy.  Skin:    General: Skin is warm and dry.     Coloration: Skin is not pale.     Findings: No bruising, erythema, lesion or rash.  Neurological:     Mental Status: She is alert. Mental status is at baseline.     Cranial Nerves: No cranial nerve deficit.     Sensory: No sensory deficit.     Motor: No weakness.     Coordination: Coordination normal.     Gait: Gait abnormal.  Psychiatric:        Mood and Affect: Mood normal.        Speech: Speech normal.         Behavior: Behavior normal.        Thought Content: Thought content normal.        Judgment: Judgment normal.     Labs reviewed: Recent Labs    04/14/22 1331 08/31/22 1614  NA 140 143  K 4.9 4.8  CL 109 110  CO2 22 25  GLUCOSE 109* 84  BUN 32* 34*  CREATININE 1.37* 1.64*  CALCIUM 10.0 9.5   Recent Labs    04/14/22 1331 08/31/22 1614  AST 22 17  ALT 25 16  BILITOT 0.4 0.3  PROT 7.7 7.0   Recent Labs    04/14/22 1331 08/31/22 1614  WBC 5.6 6.0  NEUTROABS 2,470 2,754  HGB 11.9 11.7  HCT 36.0 35.9  MCV 94.0 94.0  PLT 346 279   Lab Results  Component Value Date   TSH 1.42 08/31/2022   Lab Results  Component Value Date   HGBA1C 6.7 (H) 08/31/2022   Lab Results  Component Value Date   CHOL 161 08/31/2022   HDL 64 08/31/2022   LDLCALC 83 08/31/2022   TRIG 60 08/31/2022   CHOLHDL 2.5 08/31/2022    Significant Diagnostic Results in last 30 days:  No results found.  Assessment/Plan 1. Type 2 diabetes mellitus with stage 4 chronic  kidney disease, without long-term current use of insulin (HCC) (Primary) Lab Results  Component Value Date   HGBA1C 6.7 (H) 08/31/2022  -Continue on glipizide  -Check blood sugars daily - Complete Metabolic Panel with eGFR - CBC with Differential/Platelet - Lipid Panel - Hemoglobin A1c - Ambulatory referral to Nephrology - blood glucose meter kit and supplies KIT; Dispense based on patient and insurance preference. Use up to four times daily as directed.  Dispense: 1 each; Refill: 5 - glucose blood test strip; 1 each by Other route as needed for other. Use as instructed  Dispense: 100 each; Refill: 3 - Lancets Ultra Thin 30G MISC; Inject 1 each into the skin daily.  Dispense: 30 each; Refill: 3  2. Essential hypertension Blood pressure stable -Continue on amlodipine , hydralazine  and losartan  Daughter would like blood pressure medication reduce.  Advised to check blood pressure at home and record then will reevaluate prior to  weaning off medication. - Complete Metabolic Panel with eGFR - CBC with Differential/Platelet  3. Chronic diastolic congestive heart failure (HCC) No signs of fluid overload Continue to monitor weight  - Complete Metabolic Panel with eGFR - CBC with Differential/Platelet  4. Hyperlipidemia LDL goal <70 Previous LDL at goal Continue on simvastatin  no side effects reported - Lipid Panel  5. Late onset Alzheimer's dementia without behavioral disturbance (HCC) Requires assistance with ADLs -Continue with adult daycare program -Continue with supportive care -Continue on Ativan  and Depakote   6. Generalized anxiety disorder Stable Continue on Ativan   7. Primary osteoarthritis involving multiple joints Worse on right knee -Continue on current over-the-counter analgesic -Follow-up with orthopedic  8. Need for influenza vaccination Afebrile -Flut shot administered by CMA no acute reaction reported.   - Flu Vaccine Trivalent High Dose (Fluad)  9. Candidiasis of skin Continue on nystatin  - nystatin  cream (MYCOSTATIN ); Apply 1 Application topically 2 (two) times daily.  Dispense: 30 g; Refill: 3  10. Unsteady gait Fall and safety precaution -Continue to work with therapy during day program  Family/ staff Communication: Reviewed plan of care with patient and daughter verbalized understanding  Labs/tests ordered:  - Complete Metabolic Panel with eGFR - CBC with Differential/Platelet - Lipid Panel - Hemoglobin A1c  Next Appointment : Return in about 6 months (around 08/18/2023) for medical mangement of chronic issues..   Merlinda Wrubel C Tula Schryver, NP

## 2023-02-18 NOTE — Patient Instructions (Addendum)
 Cardiology :  Elder Negus, MD Pager: [phone] Office: [phone]

## 2023-02-19 LAB — HEMOGLOBIN A1C
Hgb A1c MFr Bld: 6.6 %{Hb} — ABNORMAL HIGH (ref <5.7)
Mean Plasma Glucose: 143 mg/dL
eAG (mmol/L): 7.9 mmol/L

## 2023-02-19 LAB — LIPID PANEL
Cholesterol: 164 mg/dL (ref <200)
HDL: 58 mg/dL (ref >=50)
LDL Cholesterol (Calc): 85 mg/dL
Non-HDL Cholesterol (Calc): 106 mg/dL (ref <130)
Total CHOL/HDL Ratio: 2.8 (calc) (ref <5.0)
Triglycerides: 117 mg/dL (ref <150)

## 2023-02-19 LAB — COMPLETE METABOLIC PANEL WITH GFR
AG Ratio: 1.2 (calc) (ref 1.0–2.5)
ALT: 12 U/L (ref 6–29)
AST: 19 U/L (ref 10–35)
Albumin: 4.1 g/dL (ref 3.6–5.1)
Alkaline phosphatase (APISO): 86 U/L (ref 37–153)
BUN/Creatinine Ratio: 19 (calc) (ref 6–22)
BUN: 24 mg/dL (ref 7–25)
CO2: 23 mmol/L (ref 20–32)
Calcium: 9.9 mg/dL (ref 8.6–10.4)
Chloride: 108 mmol/L (ref 98–110)
Creat: 1.26 mg/dL — ABNORMAL HIGH (ref 0.60–0.95)
Globulin: 3.5 g/dL (ref 1.9–3.7)
Glucose, Bld: 141 mg/dL — ABNORMAL HIGH (ref 65–99)
Potassium: 4.7 mmol/L (ref 3.5–5.3)
Sodium: 140 mmol/L (ref 135–146)
Total Bilirubin: 0.5 mg/dL (ref 0.2–1.2)
Total Protein: 7.6 g/dL (ref 6.1–8.1)
eGFR: 42 mL/min/{1.73_m2} — ABNORMAL LOW (ref >=60)

## 2023-02-19 LAB — CBC WITH DIFFERENTIAL/PLATELET
Absolute Lymphocytes: 1961 {cells}/uL (ref 850–3900)
Absolute Monocytes: 297 {cells}/uL (ref 200–950)
Basophils Absolute: 30 {cells}/uL (ref 0–200)
Basophils Relative: 0.7 %
Eosinophils Absolute: 9 {cells}/uL — ABNORMAL LOW (ref 15–500)
Eosinophils Relative: 0.2 %
HCT: 40.1 % (ref 35.0–45.0)
Hemoglobin: 13.1 g/dL (ref 11.7–15.5)
MCH: 30.9 pg (ref 27.0–33.0)
MCHC: 32.7 g/dL (ref 32.0–36.0)
MCV: 94.6 fL (ref 80.0–100.0)
MPV: 9.9 fL (ref 7.5–12.5)
Monocytes Relative: 6.9 %
Neutro Abs: 2004 {cells}/uL (ref 1500–7800)
Neutrophils Relative %: 46.6 %
Platelets: 284 10*3/uL (ref 140–400)
RBC: 4.24 10*6/uL (ref 3.80–5.10)
RDW: 11.7 % (ref 11.0–15.0)
Total Lymphocyte: 45.6 %
WBC: 4.3 10*3/uL (ref 3.8–10.8)

## 2023-02-21 ENCOUNTER — Ambulatory Visit: Payer: Medicaid Other | Admitting: Physician Assistant

## 2023-02-21 ENCOUNTER — Telehealth: Payer: Self-pay

## 2023-02-21 ENCOUNTER — Encounter: Payer: Self-pay | Admitting: Physician Assistant

## 2023-02-21 DIAGNOSIS — M1711 Unilateral primary osteoarthritis, right knee: Secondary | ICD-10-CM

## 2023-02-21 MED ORDER — METHYLPREDNISOLONE ACETATE 40 MG/ML IJ SUSP
40.0000 mg | INTRAMUSCULAR | Status: AC | PRN
  Administered 2023-02-21: 40 mg via INTRA_ARTICULAR

## 2023-02-21 MED ORDER — LIDOCAINE HCL 1 % IJ SOLN
3.0000 mL | INTRAMUSCULAR | Status: AC | PRN
  Administered 2023-02-21: 3 mL

## 2023-02-21 NOTE — Progress Notes (Signed)
 Office Visit Note   Patient: Michelle Rowe           Date of Birth: 03-25-1940           MRN: [id number] Visit Date: 02/21/2023              Requested by: Leonarda Roxan BROCKS, NP [address] [place],  [place] [address] PCP: Leonarda Roxan BROCKS, NP  Chief Complaint  Patient presents with  . Right Knee - Pain      HPI: [name] is a pleasant 83 year old woman who comes in with her family member today.  She has a history of osteoarthritis of her knees.  Comes in requesting right knee injection today denies any injuries  Assessment & Plan: Visit Diagnoses: Osteoarthritis right knee  Plan: Right knee injected without difficulty may follow-up with me as needed  Follow-Up Instructions: No follow-ups on file.   Ortho Exam  Patient is alert, oriented, no adenopathy, well-dressed, normal affect, normal respiratory effort. Right knee she has no erythema no effusion compartments are soft and compressible she is neurovascular intact previous x-rays demonstrate tricompartmental advanced osteoarthritic changes  Imaging: No results found. No images are attached to the encounter.  Labs: Lab Results  Component Value Date   HGBA1C 6.6 (H) 02/18/2023   HGBA1C 6.7 (H) 08/31/2022   HGBA1C 6.5 (H) 04/14/2022   LABURIC 3.0 10/02/2019     Lab Results  Component Value Date   ALBUMIN 4.3 12/06/2019   ALBUMIN 3.7 07/14/2019   ALBUMIN 4.4 03/24/2018    No results found for: MG Lab Results  Component Value Date   VD25OH 35.46 12/06/2019    No results found for: PREALBUMIN    Latest Ref Rng & Units 02/18/2023    9:54 AM 08/31/2022    4:14 PM 04/14/2022    1:31 PM  CBC EXTENDED  WBC 3.8 - 10.8 Thousand/uL 4.3  6.0  5.6   RBC 3.80 - 5.10 Million/uL 4.24  3.82  3.83   Hemoglobin 11.7 - 15.5 g/dL 86.8  88.2  88.0   HCT 35.0 - 45.0 % 40.1  35.9  36.0   Platelets 140 - 400 Thousand/uL 284  279  346   NEUT# 1,500 - 7,800 cells/uL 2,004  2,754  2,470   Lymph# 850 - 3,900 cells/uL  2,766  2,660       There is no height or weight on file to calculate BMI.  Orders:  No orders of the defined types were placed in this encounter.  No orders of the defined types were placed in this encounter.    Procedures: Large Joint Inj: R knee on 02/21/2023 3:35 PM Indications: pain and diagnostic evaluation Details: 25 G 1.5 in needle, anteromedial approach  Arthrogram: No  Medications: 40 mg methylPREDNISolone  acetate 40 MG/ML; 3 mL lidocaine  1 % Outcome: tolerated well, no immediate complications Procedure, treatment alternatives, risks and benefits explained, specific risks discussed. Consent was given by the patient.    Clinical Data: No additional findings.  ROS:  All other systems negative, except as noted in the HPI. Review of Systems  Objective: Vital Signs: There were no vitals taken for this visit.  Specialty Comments:  No specialty comments available.  PMFS History: Patient Active Problem List   Diagnosis Date Noted  . Obesity, morbid (HCC) 02/18/2023  . Type 2 diabetes mellitus with stage 4 chronic kidney disease, without long-term current use of insulin (HCC) 08/18/2022  . Generalized anxiety disorder 08/18/2022  . Hyperlipidemia LDL  goal <70 08/18/2022  . Chronic diastolic congestive heart failure (HCC) 08/18/2022  . Chronic pain of right knee 08/18/2022  . 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 Urinary incontinence 08/18/2022  . Chronic gout without tophus 08/18/2022  . Pain in left shoulder 04/22/2022  . Bilateral primary osteoarthritis of knee 06/18/2021  . Diastolic dysfunction without heart failure 11/08/2019  . Essential hypertension 11/08/2019  . Cough due to ACE inhibitor 11/08/2019   Past Medical History:  Diagnosis Date  . Anterolisthesis    Grade 1 of L3 on L4 on x-ray 07/18/19  . Aortic regurgitation    Seen on 2D echo 07/07/16  . Cataract   . CKD (chronic kidney disease), stage III (HCC)   . DDD  (degenerative disc disease), lumbar   . Diabetes mellitus without complication (HCC)   . Diastolic CHF (HCC)    Grade 2/4 with LVEF of 60-65%  . Diastolic dysfunction    Grade 2/4 with LVEF of 60-65% and LVH  . Gout   . HLD (hyperlipidemia)   . Hyperparathyroidism (HCC)   . Hypertension   . Lumbar facet arthropathy   . Memory impairment   . Mitral valve regurgitation    Seen on 2D echo 07/07/16  . OA (osteoarthritis)   . OSA (obstructive sleep apnea)   . Osteoarthritis   . Polycythemia   . Shingles   . Sleep apnea   . Thrombocytopenia (HCC)   . Vitamin D deficiency     Family History  Problem Relation Age of Onset  . Diabetes Mother   . Hypertension Father   . Hypertension Brother   . Hypertension Sister   . Diabetes Sister     Past Surgical History:  Procedure Laterality Date  . LAPAROSCOPIC HYSTERECTOMY     Social History   Occupational History    Comment: NA  Tobacco Use  . Smoking status: Never  . Smokeless tobacco: Never  Substance and Sexual Activity  . Alcohol use: Never  . Drug use: Never  . Sexual activity: Not Currently

## 2023-02-21 NOTE — Telephone Encounter (Signed)
Refaxed to number provided in message.

## 2023-02-21 NOTE — Telephone Encounter (Signed)
 Patients family member called stating that [facility] medical is needing a Rx with the diagnosis code.  The Rx that was given to patient did not have the code on it.  Please fax to [facility] at  # [phone].  Please advise.  Thank you.

## 2023-03-03 ENCOUNTER — Telehealth: Payer: Self-pay

## 2023-03-03 NOTE — Telephone Encounter (Signed)
DME order didn't print for parachute.Thank you.

## 2023-03-03 NOTE — Telephone Encounter (Signed)
Patient daughter wants to know what's the status on wheelchair order? Message routed to PCP Ngetich, Donalee Citrin, NP and medical assistant Demetria.W/CMA assigned during the day order was placed

## 2023-03-03 NOTE — Telephone Encounter (Signed)
Order was just placed by [facility] today. I checked with her this morning. She was not informed by you that DME was being placed on day of visit. Message routed to PCP Ngetich, Donalee Citrin, NP

## 2023-03-03 NOTE — Telephone Encounter (Signed)
Light weight wheelchair was ordered during visit.please check status with DME company.

## 2023-03-04 ENCOUNTER — Ambulatory Visit: Payer: Medicaid Other | Admitting: Family

## 2023-05-02 ENCOUNTER — Other Ambulatory Visit: Payer: Self-pay | Admitting: Family

## 2023-05-02 ENCOUNTER — Telehealth: Payer: Self-pay

## 2023-05-02 DIAGNOSIS — Z1231 Encounter for screening mammogram for malignant neoplasm of breast: Secondary | ICD-10-CM

## 2023-05-02 NOTE — Telephone Encounter (Signed)
 I called and spoke to patient daughter and figured out why it's been 2 months since wheelchair has been delivered. Patient address in [employer] was incorrect and patient phone number was a house phone and not cell phone. Point of contact is daughter cell phone and not the house phone.

## 2023-05-02 NOTE — Telephone Encounter (Signed)
 Copied from CRM [id number]. Topic: General - Other >> May 02, 2023 10:35 AM Hamdi H wrote: Reason for CRM: Patients daughter Rinaldo Cloud called to let us know that she still hasn't received the light weight wheelchair that was ordered about 2 months ago. She lost the information for the DME company, she needed their name and phone number. Since they moved she wanted to make sure that they have her correct address and she wanted to check to see why she hasn't received the wheelchair yet. Please call patient back at [phone] to give her this information. >> May 02, 2023 12:45 PM Tiffany H wrote: Patient's daughter Rinaldo Cloud called to speak with Clydie Braun regarding order for wheelchair. Please return call ASAP.

## 2023-05-02 NOTE — Telephone Encounter (Signed)
 [facility] CHAT  Viewing:All Activity Michelle Rowe (You)  [employer] (2) just now  Old address was listed which is why product couldn't be delivered. Incorrect phone number was in order as well.  Michelle Rowe (You)  [employer] [facility] (2) 4m ago  Phone number to contact daughter Michelle Rowe [phone] Michelle Rowe (You)  [facility] (2) 42m ago  Correct address is  [address]  [place] [address]  Michelle Rowe [facility] 3/24 ? 2:30PM  The order show still pending. The logistics team has noted they have made multiple attempts to contact and left voicemails and did drive by as well but no answer and no return contact from the patient or family as of yet. Thank you.  Michelle Rowe (You)  [facility] (2) 3/24 ? 1:05PM  What is the status of this order? Patient daughter is requesting an update. Order was placed 2 months ago.  [facility] 3/21 ? 4:40PM  Scheduling Delivery: Order for [employer], 18 in x 16 in + 10 other items is in the final stages of being processed [facility] 2/3 ? 2:16PM  Delivery Unsuccessful: Order for [employer], 18 in x 16 in + 10 other items could not be delivered and needs to be rescheduled [facility] 1/11 ? 4:36PM  Scheduling Delivery: Order for [employer], 18 in x 16 in + 10 other items is in the final stages of being processed [facility] 1/11 ? 4:24PM  Awaiting Final Review: Order for [employer], 18 in x 16 in + 10 other items has been received and will be processed shortly [name] [facility] 1/11 ? 4:20PM  accepted order successfully. #[id number] [name] [facility] 1/10 ? 12:43PM  Thank you for choosing [facility]. We have received your order and will begin to review. We will communicate through the activity feed within the  [facility] order to let you know if anything else is required prior to being able to accept and move forward with your order. Again, thank you for choosing [facility].  brooke whitfield [facility] (2) 1/10 ? 12:15PM  submitted to [facility] [name] [facility] NGETICH 1/10 ? 12:15PM approved order  brooke whitfield [facility] (2) 1/10 ? 11:36AM created order

## 2023-05-03 ENCOUNTER — Ambulatory Visit

## 2023-05-03 NOTE — Telephone Encounter (Signed)
 [facility]     Lamar Laundry  [facility] 3/24 ? 4:18PM I updated the address and phone number and sent back to dispatch for further follow up for scheduling. Thank you.

## 2023-05-09 ENCOUNTER — Other Ambulatory Visit: Payer: Self-pay | Admitting: Family

## 2023-05-09 DIAGNOSIS — J302 Other seasonal allergic rhinitis: Secondary | ICD-10-CM

## 2023-05-09 NOTE — Telephone Encounter (Signed)
Message routed to PCP Ngetich, [name] C, NP  

## 2023-05-09 NOTE — Telephone Encounter (Signed)
Noted thank  You

## 2023-05-09 NOTE — Telephone Encounter (Signed)
 Copied from CRM [id number]. Topic: General - Other >> May 09, 2023  8:49 AM Prudencio Pair wrote: Reason for CRM: Patient's daughter, Rinaldo Cloud, called in wanting to know if Dr. Elam Dutch can give her a letter or something stating that she is her mother's sole caregiver due to her having dementia. If this able to be completed, she asks that it be sent to her email address, which is [email or web address]. Please give her a call to advise whether this can be completed or not. CB #: [phone].

## 2023-05-09 NOTE — Telephone Encounter (Signed)
 Care giver letter written as request.Please notify patient's daughter.send letter as requested. Printed at given to adm.staff.

## 2023-05-19 ENCOUNTER — Other Ambulatory Visit: Payer: Self-pay | Admitting: Family

## 2023-05-19 DIAGNOSIS — E1122 Type 2 diabetes mellitus with diabetic chronic kidney disease: Secondary | ICD-10-CM

## 2023-05-31 ENCOUNTER — Other Ambulatory Visit: Payer: Self-pay | Admitting: Family

## 2023-05-31 DIAGNOSIS — I10 Essential (primary) hypertension: Secondary | ICD-10-CM

## 2023-06-06 ENCOUNTER — Other Ambulatory Visit: Payer: Self-pay | Admitting: Family

## 2023-06-06 DIAGNOSIS — I10 Essential (primary) hypertension: Secondary | ICD-10-CM

## 2023-06-08 NOTE — Telephone Encounter (Signed)
 Message routed to Medical Assistant Demetria.W following this order.

## 2023-06-08 NOTE — Telephone Encounter (Signed)
 06/07/2023 03:36 PM EDT by Benjamine Brandt A - Reason for CRM: Patients daughter Dina Francisco called to let us  know that the patient still hasn't received her light weight wheelchair. She would like help figuring out how to get that for her mother also she wanted to know when is her mother due for a follow up.

## 2023-06-23 ENCOUNTER — Telehealth: Payer: Self-pay

## 2023-06-23 NOTE — Telephone Encounter (Signed)
 Copied from CRM [id number]. Topic: Clinical - Order For Equipment >> Jun 23, 2023  1:06 PM Michelle Rowe wrote: Reason for CRM: Patients daughter called in for update on lightweight wheelchair. Patient still has not received wheelchair. It appears wheelchair was ordered back in January of this year and still has not been received. Please contact patients daughter to provide update asap.  Spoke with patient daughter asking we can resend another order for the lightweight wheelchair. Please Advise   Message sent to Ngetich, Michelle C, NP

## 2023-06-23 NOTE — Telephone Encounter (Signed)
 noted

## 2023-06-23 NOTE — Telephone Encounter (Signed)
 Please verify which DME company prescription was sent to.

## 2023-06-23 NOTE — Telephone Encounter (Signed)
 Spoke with daughter over the phone to let her know that I will put the order in on Monday through [facility].   Message sent to Ngetich, [name] C, NP

## 2023-06-27 NOTE — Telephone Encounter (Signed)
 Noted

## 2023-06-27 NOTE — Telephone Encounter (Addendum)
 Spoke with patient daughter to let know that the order has been placed. Message sent to Ngetich, [name] C, NP

## 2023-06-27 NOTE — Telephone Encounter (Signed)
 Patients daughter called and wanted to check the status of her wheelchair order, could you please give her a call at [phone] about the status.   Forwarded message to [place].

## 2023-07-01 ENCOUNTER — Telehealth: Payer: Self-pay

## 2023-07-01 NOTE — Telephone Encounter (Signed)
 Copied from CRM [id number]. Topic: Clinical - Order For Equipment >> Jul 01, 2023  9:54 AM Danelle Dunning F wrote: Reason for CRM:   Patient's daughter, Dina Francisco, called in looking for a update wheelchair; She stated that the patient has been waiting six months for a resolve; Patient daughter is looking to have someone answer when the wheelchair will be delivered to her.   They are concerned due to them getting ready to start taking vacation and the chair is needed for patient mobility assistance.   Please leave detailed message if call is not answered; this is a secure line  Callback Number: [phone]

## 2023-07-02 ENCOUNTER — Other Ambulatory Visit: Payer: Self-pay | Admitting: Family

## 2023-07-02 DIAGNOSIS — G301 Alzheimer's disease with late onset: Secondary | ICD-10-CM

## 2023-07-05 NOTE — Telephone Encounter (Signed)
 High risk warning

## 2023-07-15 ENCOUNTER — Ambulatory Visit

## 2023-07-15 ENCOUNTER — Ambulatory Visit (INDEPENDENT_AMBULATORY_CARE_PROVIDER_SITE_OTHER): Admitting: Physician Assistant

## 2023-07-15 ENCOUNTER — Telehealth: Payer: Self-pay | Admitting: Family

## 2023-07-15 ENCOUNTER — Encounter: Payer: Self-pay | Admitting: Physician Assistant

## 2023-07-15 DIAGNOSIS — M170 Bilateral primary osteoarthritis of knee: Secondary | ICD-10-CM | POA: Diagnosis not present

## 2023-07-15 DIAGNOSIS — M1711 Unilateral primary osteoarthritis, right knee: Secondary | ICD-10-CM

## 2023-07-15 MED ORDER — METHYLPREDNISOLONE ACETATE 40 MG/ML IJ SUSP
40.0000 mg | INTRAMUSCULAR | Status: AC | PRN
  Administered 2023-07-15: 40 mg via INTRA_ARTICULAR

## 2023-07-15 MED ORDER — LIDOCAINE HCL 1 % IJ SOLN
3.0000 mL | INTRAMUSCULAR | Status: AC | PRN
  Administered 2023-07-15: 3 mL

## 2023-07-15 NOTE — Telephone Encounter (Signed)
 CAP/DA Forms The forms were received via fax. Put it in the providers folder for review.

## 2023-07-15 NOTE — Progress Notes (Signed)
 Office Visit Note   Patient: Michelle Rowe           Date of Birth: 01/02/1941           MRN: [id number] Visit Date: 07/15/2023              Requested by: Estil Heman, NP [address] [place],  [place] [address] PCP: Estil Heman, NP  Bilateral knee pain    HPI: Patient is a pleasant 83 year old woman who is a former patient of Dr. Aviva Lemmings.  She has a history of dementia.  She is brought in by her granddaughter today for bilateral knee injections.  She is a diabetic  Assessment & Plan: Visit Diagnoses:  1. Bilateral primary osteoarthritis of knee     Plan: Explained to her granddaughter we can do 1 knee today and 1 in 2 weeks.  Since historically the right knee has been more symptomatic we went forward with that today can follow-up in 2 weeks for examination and injection of the left knee  Follow-Up Instructions: Return in about 2 weeks (around 07/29/2023).   Ortho Exam  Patient is alert, oriented, no adenopathy, well-dressed, normal affect, normal respiratory effort. Examination of her knee shows no effusion no erythema compartments are soft and compressible no evidence of infection or cellulitis    Imaging: No results found. No images are attached to the encounter.  Labs: Lab Results  Component Value Date   HGBA1C 6.6 (H) 02/18/2023   HGBA1C 6.7 (H) 08/31/2022   HGBA1C 6.5 (H) 04/14/2022   LABURIC 3.0 10/02/2019     Lab Results  Component Value Date   ALBUMIN 4.3 12/06/2019   ALBUMIN 3.7 07/14/2019   ALBUMIN 4.4 03/24/2018    No results found for: "MG" Lab Results  Component Value Date   VD25OH 35.46 12/06/2019    No results found for: "PREALBUMIN"    Latest Ref Rng & Units 02/18/2023    9:54 AM 08/31/2022    4:14 PM 04/14/2022    1:31 PM  CBC EXTENDED  WBC 3.8 - 10.8 Thousand/uL 4.3  6.0  5.6   RBC 3.80 - 5.10 Million/uL 4.24  3.82  3.83   Hemoglobin 11.7 - 15.5 g/dL 95.2  84.1  32.4   HCT 35.0 - 45.0 % 40.1  35.9  36.0   Platelets 140 -  400 Thousand/uL 284  279  346   NEUT# 1,500 - 7,800 cells/uL 2,004  2,754  2,470   Lymph# 850 - 3,900 cells/uL  2,766  2,660      There is no height or weight on file to calculate BMI.  Orders:  No orders of the defined types were placed in this encounter.  No orders of the defined types were placed in this encounter.    Procedures: Large Joint Inj: R knee on 07/15/2023 10:32 AM Indications: pain and diagnostic evaluation Details: 25 G 1.5 in needle, anteromedial approach  Arthrogram: No  Medications: 40 mg methylPREDNISolone  acetate 40 MG/ML; 3 mL lidocaine  1 % Outcome: tolerated well, no immediate complications Procedure, treatment alternatives, risks and benefits explained, specific risks discussed. Consent was given by the patient.    Clinical Data: No additional findings.  ROS:  All other systems negative, except as noted in the HPI. Review of Systems  Objective: Vital Signs: There were no vitals taken for this visit.  Specialty Comments:  No specialty comments available.  PMFS History: Patient Active Problem List   Diagnosis Date Noted  .  Obesity, morbid (HCC) 02/18/2023  . Type 2 diabetes mellitus with stage 4 chronic kidney disease, without long-term current use of insulin (HCC) 08/18/2022  . Generalized anxiety disorder 08/18/2022  . Hyperlipidemia LDL goal <70 08/18/2022  . Chronic diastolic congestive heart failure (HCC) 08/18/2022  . Chronic pain of right knee 08/18/2022  . 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 Urinary incontinence 08/18/2022  . Chronic gout without tophus 08/18/2022  . Pain in left shoulder 04/22/2022  . Bilateral primary osteoarthritis of knee 06/18/2021  . Diastolic dysfunction without heart failure 11/08/2019  . Essential hypertension 11/08/2019  . Cough due to ACE inhibitor 11/08/2019   Past Medical History:  Diagnosis Date  . Anterolisthesis    Grade 1 of L3 on L4 on x-ray 07/18/19   . Aortic regurgitation    Seen on 2D echo 07/07/16  . Cataract   . CKD (chronic kidney disease), stage III (HCC)   . DDD (degenerative disc disease), lumbar   . Diabetes mellitus without complication (HCC)   . Diastolic CHF (HCC)    Grade 2/4 with LVEF of 60-65%  . Diastolic dysfunction    Grade 2/4 with LVEF of 60-65% and LVH  . Gout   . HLD (hyperlipidemia)   . Hyperparathyroidism (HCC)   . Hypertension   . Lumbar facet arthropathy   . Memory impairment   . Mitral valve regurgitation    Seen on 2D echo 07/07/16  . OA (osteoarthritis)   . OSA (obstructive sleep apnea)   . Osteoarthritis   . Polycythemia   . Shingles   . Sleep apnea   . Thrombocytopenia (HCC)   . Vitamin D deficiency     Family History  Problem Relation Age of Onset  . Diabetes Mother   . Hypertension Father   . Hypertension Brother   . Hypertension Sister   . Diabetes Sister     Past Surgical History:  Procedure Laterality Date  . LAPAROSCOPIC HYSTERECTOMY     Social History   Occupational History    Comment: NA  Tobacco Use  . Smoking status: Never  . Smokeless tobacco: Never  Substance and Sexual Activity  . Alcohol use: Never  . Drug use: Never  . Sexual activity: Not Currently

## 2023-07-18 NOTE — Telephone Encounter (Signed)
 Forms completed.placed in outgoing fax box.please attach medication list.

## 2023-07-19 NOTE — Telephone Encounter (Signed)
 Noted! Thank you

## 2023-07-25 ENCOUNTER — Other Ambulatory Visit: Payer: Self-pay | Admitting: Family

## 2023-07-25 DIAGNOSIS — N184 Chronic kidney disease, stage 4 (severe): Secondary | ICD-10-CM

## 2023-07-25 DIAGNOSIS — I10 Essential (primary) hypertension: Secondary | ICD-10-CM

## 2023-07-25 DIAGNOSIS — J302 Other seasonal allergic rhinitis: Secondary | ICD-10-CM

## 2023-07-25 DIAGNOSIS — F411 Generalized anxiety disorder: Secondary | ICD-10-CM

## 2023-07-25 DIAGNOSIS — E785 Hyperlipidemia, unspecified: Secondary | ICD-10-CM

## 2023-07-25 NOTE — Telephone Encounter (Signed)
 Patient has request for refill on Lorazepam . Patient last refill was 01/17/2023. Patient has contract on file dated 09/02/2022. Patient has upcoming appointment 08/19/2023. Updated contract/Sign contract was added to appointment notes. Medication pend and sent to PCP (Ngetich, [name] C, NP) for approval.

## 2023-07-28 ENCOUNTER — Ambulatory Visit

## 2023-07-29 ENCOUNTER — Ambulatory Visit: Admitting: Physician Assistant

## 2023-07-31 ENCOUNTER — Other Ambulatory Visit: Payer: Self-pay

## 2023-07-31 ENCOUNTER — Emergency Department (HOSPITAL_COMMUNITY)

## 2023-07-31 ENCOUNTER — Emergency Department (HOSPITAL_COMMUNITY)
Admission: EM | Admit: 2023-07-31 | Discharge: 2023-08-01 | Disposition: A | Discharge status: Home / Self Care | Attending: Emergency Medicine | Admitting: Emergency Medicine

## 2023-07-31 ENCOUNTER — Encounter (HOSPITAL_COMMUNITY): Payer: Self-pay

## 2023-07-31 DIAGNOSIS — R4182 Altered mental status, unspecified: Secondary | ICD-10-CM | POA: Insufficient documentation

## 2023-07-31 DIAGNOSIS — Z7982 Long term (current) use of aspirin: Secondary | ICD-10-CM | POA: Diagnosis not present

## 2023-07-31 DIAGNOSIS — R0609 Other forms of dyspnea: Secondary | ICD-10-CM | POA: Insufficient documentation

## 2023-07-31 DIAGNOSIS — I5030 Unspecified diastolic (congestive) heart failure: Secondary | ICD-10-CM | POA: Diagnosis not present

## 2023-07-31 DIAGNOSIS — E1122 Type 2 diabetes mellitus with diabetic chronic kidney disease: Secondary | ICD-10-CM | POA: Diagnosis not present

## 2023-07-31 DIAGNOSIS — Z79899 Other long term (current) drug therapy: Secondary | ICD-10-CM | POA: Insufficient documentation

## 2023-07-31 DIAGNOSIS — G309 Alzheimer's disease, unspecified: Secondary | ICD-10-CM | POA: Diagnosis not present

## 2023-07-31 DIAGNOSIS — I130 Hypertensive heart and chronic kidney disease with heart failure and stage 1 through stage 4 chronic kidney disease, or unspecified chronic kidney disease: Secondary | ICD-10-CM | POA: Insufficient documentation

## 2023-07-31 DIAGNOSIS — R600 Localized edema: Secondary | ICD-10-CM | POA: Insufficient documentation

## 2023-07-31 DIAGNOSIS — R197 Diarrhea, unspecified: Secondary | ICD-10-CM | POA: Diagnosis not present

## 2023-07-31 DIAGNOSIS — F0390 Unspecified dementia without behavioral disturbance: Secondary | ICD-10-CM | POA: Insufficient documentation

## 2023-07-31 DIAGNOSIS — N189 Chronic kidney disease, unspecified: Secondary | ICD-10-CM | POA: Diagnosis not present

## 2023-07-31 LAB — CBC
HCT: 37.4 % (ref 36.0–46.0)
Hemoglobin: 11.6 g/dL — ABNORMAL LOW (ref 12.0–15.0)
MCH: 31.4 pg (ref 26.0–34.0)
MCHC: 31 g/dL (ref 30.0–36.0)
MCV: 101.1 fL — ABNORMAL HIGH (ref 80.0–100.0)
Platelets: 212 10*3/uL (ref 150–400)
RBC: 3.7 MIL/uL — ABNORMAL LOW (ref 3.87–5.11)
RDW: 13.2 % (ref 11.5–15.5)
WBC: 6.4 10*3/uL (ref 4.0–10.5)
nRBC: 0 % (ref 0.0–0.2)

## 2023-07-31 LAB — URINALYSIS, ROUTINE W REFLEX MICROSCOPIC
Bilirubin Urine: NEGATIVE
Glucose, UA: NEGATIVE mg/dL
Hgb urine dipstick: NEGATIVE
Ketones, ur: NEGATIVE mg/dL
Leukocytes,Ua: NEGATIVE
Nitrite: NEGATIVE
Protein, ur: NEGATIVE mg/dL
Specific Gravity, Urine: 1.019 (ref 1.005–1.030)
pH: 5 (ref 5.0–8.0)

## 2023-07-31 LAB — RESP PANEL BY RT-PCR (RSV, FLU A&B, COVID)  RVPGX2
Influenza A by PCR: NEGATIVE
Influenza B by PCR: NEGATIVE
Resp Syncytial Virus by PCR: NEGATIVE
SARS Coronavirus 2 by RT PCR: NEGATIVE

## 2023-07-31 LAB — BASIC METABOLIC PANEL WITH GFR
Anion gap: 9 (ref 5–15)
BUN: 35 mg/dL — ABNORMAL HIGH (ref 8–23)
CO2: 20 mmol/L — ABNORMAL LOW (ref 22–32)
Calcium: 9.3 mg/dL (ref 8.9–10.3)
Chloride: 113 mmol/L — ABNORMAL HIGH (ref 98–111)
Creatinine, Ser: 1.51 mg/dL — ABNORMAL HIGH (ref 0.44–1.00)
GFR, Estimated: 34 mL/min — ABNORMAL LOW (ref >60)
Glucose, Bld: 103 mg/dL — ABNORMAL HIGH (ref 70–99)
Potassium: 4.5 mmol/L (ref 3.5–5.1)
Sodium: 142 mmol/L (ref 135–145)

## 2023-07-31 LAB — BRAIN NATRIURETIC PEPTIDE: B Natriuretic Peptide: 33.7 pg/mL (ref 0.0–100.0)

## 2023-07-31 LAB — CBG MONITORING, ED: Glucose-Capillary: 96 mg/dL (ref 70–99)

## 2023-07-31 NOTE — ED Notes (Signed)
 Attempted to assist pt in walking to the bathroom. Pt refused sts I'm not walking right now. Asked pts caregiver daughter at bedside asked if she could convince the pt to walk to the bathroom to which she responded you have to convince her. I'm tired. Pt was informed of need for ambulation and urine to go home. Pt still unwilling to participate in care. PA informed.

## 2023-07-31 NOTE — ED Triage Notes (Signed)
 Pt is not acting herself. Is short of breath with movement. Has ankle swelling, hx of CHF. Daughter said that her blood sugar was 240 and now it is 96.

## 2023-07-31 NOTE — ED Notes (Signed)
 Pt ambulated to and from the bathroom without difficulty. SpO2 was 97%. PR 60s.

## 2023-07-31 NOTE — ED Provider Notes (Signed)
 [place] EMERGENCY DEPARTMENT AT [facility] Provider Note   CSN: [id number] Arrival date & time: 07/31/23  2032     Patient presents with: Altered Mental Status   Michelle Rowe is a 83 y.o. female.  {Add pertinent medical, surgical, social history, OB history to HPI:5125} 83 year old female brought in by daughter from home with concern for change in mental status/not acting like self, SHOB while walking around, ankle swelling, also several days of diarrhea. Patient with history of dementia and alzheimer, DM, HTN, HLD, CKD, hperparathyroid, OA, CHF (diastolic). Daughter checked pts blood sugar at home and it was elevated at 240, normal on arrival, no meds given in interval. No reports or fever or falls. Patient without complaints and is not a reliable historian.        Prior to Admission medications   Medication Sig Start Date End Date Taking? Authorizing Provider  allopurinol  (ZYLOPRIM ) 100 MG tablet Take 1 tablet (100 mg total) by mouth daily. 04/14/22   Ngetich, [name] C, NP  amLODipine  (NORVASC ) 10 MG tablet Take 1 tablet by mouth once daily 05/31/23   Ngetich, [name] C, NP  aspirin  81 MG chewable tablet Chew 1 tablet (81 mg total) by mouth daily. 07/30/22   Ngetich, [name] C, NP  azelastine  (ASTELIN ) 0.1 % nasal spray Place 1 spray into both nostrils 2 (two) times daily. Use in each nostril as directed 10/21/21   Lorin Norris, MD  Blood Glucose Monitoring Suppl DEVI 1 each by Does not apply route in the morning, at noon, and at bedtime. May substitute to any manufacturer covered by patient's insurance. 02/18/23   Ngetich, [name] C, NP  divalproex  (DEPAKOTE ) 125 MG DR tablet Take 1 tablet by mouth twice daily 07/05/23   Ngetich, [name] C, NP  EPINEPHrine  (EPIPEN  2-PAK) 0.3 mg/0.3 mL IJ SOAJ injection Inject 0.3 mg into the muscle as needed for anaphylaxis. 02/18/23   Ngetich, [name] C, NP  fluticasone  (FLONASE ) 50 MCG/ACT nasal spray USE 1 SPRAY(S) IN EACH NOSTRIL TWICE DAILY AS  NEEDED FOR ALLERGIES 07/25/23   Ngetich, [name] C, NP  glipiZIDE  (GLUCOTROL ) 5 MG tablet Take 1 tablet by mouth once daily 07/25/23   Ngetich, [name] C, NP  glucose blood test strip 1 each by Other route as needed for other. Use as instructed 02/18/23   Ngetich, [name] C, NP  guaiFENesin  (MUCINEX ) 600 MG 12 hr tablet Take 1 tablet (600 mg total) by mouth 2 (two) times daily as needed. 09/28/21   Medina-Vargas, Monina C, NP  hydrALAZINE  (APRESOLINE ) 25 MG tablet TAKE 1 TABLET BY MOUTH THREE TIMES DAILY 07/25/23   Ngetich, [name] C, NP  ipratropium (ATROVENT ) 0.06 % nasal spray Place 2 sprays into both nostrils 4 (four) times daily. 04/14/22   Ngetich, [name] C, NP  Lancets Ultra Thin 30G MISC Inject 1 each into the skin daily. 02/18/23   Ngetich, [name] C, NP  loratadine  (CLARITIN ) 10 MG tablet Take 1 tablet (10 mg total) by mouth daily as needed for allergies. 09/28/21   Medina-Vargas, Monina C, NP  LORazepam  (ATIVAN ) 0.5 MG tablet Take 1 tablet by mouth twice daily as needed for anxiety 07/26/23   Ngetich, [name] C, NP  losartan  (COZAAR ) 50 MG tablet Take 1 tablet by mouth once daily 07/25/23   Ngetich, [name] C, NP  nystatin  cream (MYCOSTATIN ) Apply 1 Application topically 2 (two) times daily. 02/18/23   Ngetich, [name] C, NP  simvastatin  (ZOCOR ) 10 MG tablet TAKE 1 TABLET BY MOUTH EVERY  DAY AT BEDTIME 07/25/23   Ngetich, [name] C, NP    Allergies: Augmentin [amoxicillin-pot clavulanate], Amoxicillin, and Shellfish allergy     Review of Systems Level 5 caveat for dementia  Updated Vital Signs BP 114/62   Pulse 60   Temp 98.4 F (36.9 C) (Oral)   Resp 16   Ht 5' 2 (1.575 m)   Wt 97.5 kg   SpO2 99%   BMI 39.32 kg/m   Physical Exam Vitals and nursing note reviewed.  Constitutional:      General: She is not in acute distress.    Appearance: She is well-developed. She is obese. She is not diaphoretic.  HENT:     Head: Normocephalic and atraumatic.     Mouth/Throat:     Mouth: Mucous membranes are moist.    Eyes:     Conjunctiva/sclera: Conjunctivae normal.    Cardiovascular:     Rate and Rhythm: Normal rate and regular rhythm.     Heart sounds: Normal heart sounds.  Pulmonary:     Effort: Pulmonary effort is normal.     Breath sounds: Normal breath sounds.   Musculoskeletal:     Cervical back: Neck supple.     Right lower leg: Edema present.     Left lower leg: Edema present.     Comments: Pitting edema to bilateral ankles   Neurological:     General: No focal deficit present.     Mental Status: She is alert.   Psychiatric:        Behavior: Behavior normal.     (all labs ordered are listed, but only abnormal results are displayed) Labs Reviewed  BASIC METABOLIC PANEL WITH GFR - Abnormal; Notable for the following components:      Result Value   Chloride 113 (*)    CO2 20 (*)    Glucose, Bld 103 (*)    BUN 35 (*)    Creatinine, Ser 1.51 (*)    GFR, Estimated 34 (*)    All other components within normal limits  CBC - Abnormal; Notable for the following components:   RBC 3.70 (*)    Hemoglobin 11.6 (*)    MCV 101.1 (*)    All other components within normal limits  RESP PANEL BY RT-PCR (RSV, FLU A&B, COVID)  RVPGX2  BRAIN NATRIURETIC PEPTIDE  URINALYSIS, ROUTINE W REFLEX MICROSCOPIC  VALPROIC ACID LEVEL  CBG MONITORING, ED    EKG: EKG Interpretation Date/Time:  Sunday July 31 2023 21:01:47 EDT Ventricular Rate:  44 PR Interval:  212 QRS Duration:  84 QT Interval:  452 QTC Calculation: 386 R Axis:   23  Text Interpretation: Marked sinus bradycardia with 1st degree A-V block with Premature atrial complexes Cannot rule out Anterior infarct , age undetermined Abnormal ECG When compared with ECG of 08-Jul-2019 21:32, new bradycardia from prior Confirmed by Towana Sharper [phone]) on 07/31/2023 9:41:28 PM  Radiology: DG Chest 2 View Result Date: 07/31/2023 CLINICAL DATA:  Shortness of breath and high blood sugar EXAM: CHEST - 2 VIEW COMPARISON:  02/05/2020  FINDINGS: Cardiac shadow is enlarged but stable. The overall inspiratory effort is poor although no focal confluent infiltrate is seen. No effusion is noted. No bony abnormality is noted. IMPRESSION: Poor inspiratory effort.  No acute abnormality noted. Electronically Signed   By: Oneil Devonshire M.D.   On: 07/31/2023 21:48    {Document cardiac monitor, telemetry assessment procedure when appropriate:32947} Procedures   Medications Ordered in the ED - No data to display    {  Click here for ABCD2, HEART and other calculators REFRESH Note before signing:1}                              Medical Decision Making Amount and/or Complexity of Data Reviewed Labs: ordered. Radiology: ordered.   This patient presents to the ED for concern of ankle swelling, diarrhea, DOE, change in mental status, this involves an extensive number of treatment options, and is a complaint that carries with it a high risk of complications and morbidity.  The differential diagnosis includes but not limited to CHF, electrolyte/metabolic derangement, UTI, medication    Co morbidities / Chronic conditions that complicate the patient evaluation  DM, CKD, HTN, HLD, OA, gout, polycythemia, diastolic CHF   Additional history obtained:  Additional history obtained from EMR External records from outside source obtained and reviewed including grade II diastolic dysfunction with EF 60% dated 07/18/19   Lab Tests:  I Ordered, and personally interpreted labs.  The pertinent results include:  CBC with mild anemia. BMP Cr 1.51, slightly increased from prior on file. BNP WNL. Resp panel negative for covid/flu/rsv. UA ***. Valproic acid level ***   Imaging Studies ordered:  I ordered imaging studies including CXR  I independently visualized and interpreted imaging which showed no acute process  I agree with the radiologist interpretation   Cardiac Monitoring: / EKG:  The patient was maintained on a cardiac monitor.  I  personally viewed and interpreted the cardiac monitored which showed an underlying rhythm of: sinus bradycardia, rate 44   Problem List / ED Course / Critical interventions / Medication management  *** I ordered medication including ***   Reevaluation of the patient after these medicines showed that the patient *** I have reviewed the patients home medicines and have made adjustments as needed   Consultations Obtained:  I requested consultation with the ***,  and discussed lab and imaging findings as well as pertinent plan - they recommend: ***   Social Determinants of Health:  Lives with daughter, has PCP   Test / Admission - Considered:  ***   {Document critical care time when appropriate  Document review of labs and clinical decision tools ie CHADS2VASC2, etc  Document your independent review of radiology images and any outside records  Document your discussion with family members, caretakers and with consultants  Document social determinants of health affecting pt's care  Document your decision making why or why not admission, treatments were needed:32947:::1}   Final diagnoses:  None    ED Discharge Orders     None

## 2023-08-01 ENCOUNTER — Ambulatory Visit

## 2023-08-01 LAB — VALPROIC ACID LEVEL: Valproic Acid Lvl: 32 ug/mL — ABNORMAL LOW (ref 50–100)

## 2023-08-01 NOTE — Discharge Instructions (Signed)
 Please call your doctor's office in the morning to discuss your visit tonight as well as low Depakote  level. Return to the ER for worsening or concerning symptoms.

## 2023-08-01 NOTE — ED Notes (Signed)
 Unable to complete full set of VS. Pt unable to keep her arm still. Pt wheelchair to car

## 2023-08-01 NOTE — ED Notes (Signed)
 AVS provided by edp was reviewed with pt's caregiver daughter. caregiver verbalized understanding with no additional questions at this time.

## 2023-08-03 ENCOUNTER — Ambulatory Visit
Admission: RE | Admit: 2023-08-03 | Discharge: 2023-08-03 | Disposition: A | Discharge status: Home / Self Care | Source: Ambulatory Visit | Attending: Family | Admitting: Family

## 2023-08-03 ENCOUNTER — Telehealth: Payer: Self-pay | Admitting: Sports Medicine

## 2023-08-03 ENCOUNTER — Encounter: Payer: Self-pay | Admitting: Sports Medicine

## 2023-08-03 ENCOUNTER — Ambulatory Visit (INDEPENDENT_AMBULATORY_CARE_PROVIDER_SITE_OTHER): Admitting: Sports Medicine

## 2023-08-03 VITALS — BP 122/82 | HR 48 | Temp 98.1°F | Ht 62.0 in | Wt 222.2 lb

## 2023-08-03 DIAGNOSIS — I10 Essential (primary) hypertension: Secondary | ICD-10-CM

## 2023-08-03 DIAGNOSIS — Z1231 Encounter for screening mammogram for malignant neoplasm of breast: Secondary | ICD-10-CM

## 2023-08-03 DIAGNOSIS — E1122 Type 2 diabetes mellitus with diabetic chronic kidney disease: Secondary | ICD-10-CM

## 2023-08-03 DIAGNOSIS — G309 Alzheimer's disease, unspecified: Secondary | ICD-10-CM | POA: Diagnosis not present

## 2023-08-03 DIAGNOSIS — R001 Bradycardia, unspecified: Secondary | ICD-10-CM

## 2023-08-03 DIAGNOSIS — E782 Mixed hyperlipidemia: Secondary | ICD-10-CM | POA: Diagnosis not present

## 2023-08-03 DIAGNOSIS — F02818 Dementia in other diseases classified elsewhere, unspecified severity, with other behavioral disturbance: Secondary | ICD-10-CM

## 2023-08-03 DIAGNOSIS — N1831 Chronic kidney disease, stage 3a: Secondary | ICD-10-CM

## 2023-08-03 NOTE — Progress Notes (Signed)
 Careteam: Patient Care Team: Ngetich, Roxan BROCKS, NP as PCP - General (Family Medicine)  PLACE OF SERVICE:  [facility]  Advanced Directive information    Allergies  Allergen Reactions   Augmentin [Amoxicillin-Pot Clavulanate] Rash    Critically high - Onset 07/13/2019   Amoxicillin     Did it involve swelling of the face/tongue/throat, SOB, or low BP? Y Did it involve sudden or severe rash/hives, skin peeling, or any reaction on the inside of your mouth or nose? Y (Rash, Swelling, SOB) Did you need to seek medical attention at a hospital or doctor's office? Y When did it last happen?  Yesterday     If all above answers are "NO", may proceed with cephalosporin use.    Shellfish Allergy      Chief Complaint  Patient presents with   Hospitalization Follow-up     Discussed the use of AI scribe software for clinical note transcription with the patient, who gave verbal consent to proceed.  History of Present Illness    Michelle Rowe is an 83 year old female with dementia who presents for follow-up after a recent emergency room visit for high blood sugar.  She was taken to the emergency room last Sunday due to high blood sugar levels. Her caregiver reported that her blood sugar was 287 mg/dL at home, but it was 96 mg/dL upon arrival at the hospital. No confusion or change in mentation was noted at the time, and the primary concern was the elevated blood sugar.  She has a history of dementia and requires total care for bathing and toileting. She attends a day center from Monday to Friday, where she participates in activities designed for individuals with dementia. She can eat independently and perform some tasks like tying her shoes and making her bed, but she needs assistance with selecting clothes and cleaning after urination. She wears pull-ups for incontinence. She experiences agitation and curses frequently, both at home and at the day center. She takes lorazepam  (Ativan ) twice daily  as needed for agitation, which she requires every day. No hallucinations are reported, although she occasionally talks about her mother.  She has arthritis in her right knee, which has slowed her walking. She receives injections from an orthopedic specialist to manage the arthritis pain. Despite this, she enjoys dancing and music.  Her caregiver monitors her diet closely. However, she does not like drinking water, which has been a challenge in maintaining hydration. Recent labs from the emergency room showed a slight worsening in her kidney function.  No recent falls, coughing, choking while eating, depressive symptoms, significant back pain, or abdominal pain. Occasional knee pain, particularly in the right knee. Appetite is good, and no recent changes in breathing.  Review of Systems:  Review of Systems  Unable to perform ROS: Dementia  Constitutional:  Negative for fever.  HENT:  Negative for congestion.   Respiratory:  Negative for cough and shortness of breath.   Cardiovascular:  Negative for chest pain and leg swelling.  Gastrointestinal:  Negative for abdominal pain, blood in stool, heartburn, nausea and vomiting.  Genitourinary:  Negative for dysuria.  Psychiatric/Behavioral:  Positive for memory loss.    Negative unless indicated in HPI.   Past Medical History:  Diagnosis Date   Anterolisthesis    Grade 1 of L3 on L4 on x-ray 07/18/19   Aortic regurgitation    Seen on 2D echo 07/07/16   Cataract    CKD (chronic kidney disease), stage III (HCC)  DDD (degenerative disc disease), lumbar    Diabetes mellitus without complication (HCC)    Diastolic CHF (HCC)    Grade 2/4 with LVEF of 60-65%   Diastolic dysfunction    Grade 2/4 with LVEF of 60-65% and LVH   Gout    HLD (hyperlipidemia)    Hyperparathyroidism (HCC)    Hypertension    Lumbar facet arthropathy    Memory impairment    Mitral valve regurgitation    Seen on 2D echo 07/07/16   OA (osteoarthritis)    OSA  (obstructive sleep apnea)    Osteoarthritis    Polycythemia    Shingles    Sleep apnea    Thrombocytopenia (HCC)    Vitamin D deficiency    Past Surgical History:  Procedure Laterality Date   LAPAROSCOPIC HYSTERECTOMY     Social History:   reports that she has never smoked. She has never used smokeless tobacco. She reports that she does not drink alcohol and does not use drugs.  Family History  Problem Relation Age of Onset   Diabetes Mother    Hypertension Father    Hypertension Brother    Hypertension Sister    Diabetes Sister     Medications: Patient's Medications  New Prescriptions   No medications on file  Previous Medications   ALLOPURINOL  (ZYLOPRIM ) 100 MG TABLET    Take 1 tablet (100 mg total) by mouth daily.   AMLODIPINE  (NORVASC ) 10 MG TABLET    Take 1 tablet by mouth once daily   ASPIRIN  81 MG CHEWABLE TABLET    Chew 1 tablet (81 mg total) by mouth daily.   AZELASTINE  (ASTELIN ) 0.1 % NASAL SPRAY    Place 1 spray into both nostrils 2 (two) times daily. Use in each nostril as directed   BLOOD GLUCOSE MONITORING SUPPL DEVI    1 each by Does not apply route in the morning, at noon, and at bedtime. May substitute to any manufacturer covered by patient's insurance.   DIVALPROEX  (DEPAKOTE ) 125 MG DR TABLET    Take 1 tablet by mouth twice daily   EPINEPHRINE  (EPIPEN  2-PAK) 0.3 MG/0.3 ML IJ SOAJ INJECTION    Inject 0.3 mg into the muscle as needed for anaphylaxis.   FLUTICASONE  (FLONASE ) 50 MCG/ACT NASAL SPRAY    USE 1 SPRAY(S) IN EACH NOSTRIL TWICE DAILY AS NEEDED FOR ALLERGIES   GLIPIZIDE  (GLUCOTROL ) 5 MG TABLET    Take 1 tablet by mouth once daily   GLUCOSE BLOOD TEST STRIP    1 each by Other route as needed for other. Use as instructed   GUAIFENESIN  (MUCINEX ) 600 MG 12 HR TABLET    Take 1 tablet (600 mg total) by mouth 2 (two) times daily as needed.   HYDRALAZINE  (APRESOLINE ) 25 MG TABLET    TAKE 1 TABLET BY MOUTH THREE TIMES DAILY   IPRATROPIUM (ATROVENT ) 0.06 % NASAL  SPRAY    Place 2 sprays into both nostrils 4 (four) times daily.   LANCETS ULTRA THIN 30G MISC    Inject 1 each into the skin daily.   LORATADINE  (CLARITIN ) 10 MG TABLET    Take 1 tablet (10 mg total) by mouth daily as needed for allergies.   LORAZEPAM  (ATIVAN ) 0.5 MG TABLET    Take 1 tablet by mouth twice daily as needed for anxiety   LOSARTAN  (COZAAR ) 50 MG TABLET    Take 1 tablet by mouth once daily   NYSTATIN  CREAM (MYCOSTATIN )    Apply 1 Application topically 2 (  two) times daily.   SIMVASTATIN  (ZOCOR ) 10 MG TABLET    TAKE 1 TABLET BY MOUTH EVERY DAY AT BEDTIME  Modified Medications   No medications on file  Discontinued Medications   No medications on file    Physical Exam: There were no vitals filed for this visit. There is no height or weight on file to calculate BMI. BP Readings from Last 3 Encounters:  07/31/23 114/62  02/18/23 128/82  08/31/22 130/87   Wt Readings from Last 3 Encounters:  07/31/23 215 lb (97.5 kg)  02/18/23 210 lb 6.4 oz (95.4 kg)  08/31/22 206 lb (93.4 kg)    Physical Exam Constitutional:      Appearance: Normal appearance.  HENT:     Head: Normocephalic and atraumatic.   Cardiovascular:     Rate and Rhythm: Normal rate and regular rhythm.  Pulmonary:     Effort: Pulmonary effort is normal. No respiratory distress.     Breath sounds: Normal breath sounds. No wheezing.  Abdominal:     General: Bowel sounds are normal. There is no distension.     Tenderness: There is no abdominal tenderness. There is no guarding or rebound.     Comments:     Musculoskeletal:        General: No swelling.   Neurological:     Mental Status: She is alert. Mental status is at baseline.     Motor: No weakness.     Labs reviewed: Basic Metabolic Panel: Recent Labs    08/31/22 1614 02/18/23 0954 07/31/23 2136  NA 143 140 142  K 4.8 4.7 4.5  CL 110 108 113*  CO2 25 23 20*  GLUCOSE 84 141* 103*  BUN 34* 24 35*  CREATININE 1.64* 1.26* 1.51*  CALCIUM 9.5  9.9 9.3  TSH 1.42  --   --    Liver Function Tests: Recent Labs    08/31/22 1614 02/18/23 0954  AST 17 19  ALT 16 12  BILITOT 0.3 0.5  PROT 7.0 7.6   No results for input(s): LIPASE, AMYLASE in the last 8760 hours. No results for input(s): AMMONIA in the last 8760 hours. CBC: Recent Labs    08/31/22 1614 02/18/23 0954 07/31/23 2136  WBC 6.0 4.3 6.4  NEUTROABS 2,754 2,004  --   HGB 11.7 13.1 11.6*  HCT 35.9 40.1 37.4  MCV 94.0 94.6 101.1*  PLT 279 284 212   Lipid Panel: Recent Labs    08/31/22 1614 02/18/23 0954  CHOL 161 164  HDL 64 58  LDLCALC 83 85  TRIG 60 117  CHOLHDL 2.5 2.8   TSH: Recent Labs    08/31/22 1614  TSH 1.42   A1C: Lab Results  Component Value Date   HGBA1C 6.6 (H) 02/18/2023    Assessment and Plan Assessment & Plan   1. 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 Primary hypertension At goal  Cont with the same  3. Mixed hyperlipidemia Tolerating with no side effects Cont with simvastatin   4. Type 2 diabetes mellitus with stage 3a chronic kidney disease, without long-term current use of insulin (HCC) Lab Results  Component Value Date  HGBA1C 6.6 (H) 02/18/2023   HGBA1C 6.7 (H) 08/31/2022   HGBA1C 6.5 (H) 04/14/2022   Will check A1c Monitor for hypoglycemia  5. Stage 3a chronic kidney disease (HCC) Lab Results  Component Value Date   CREATININE 1.51 (H) 07/31/2023   CREATININE 1.26 (H) 02/18/2023   CREATININE 1.64 (H) 08/31/2022   Creatinine trended up  Avoid nsaids Increase oral hydration   6. Bradycardia Recent EKG from ED EKG sinus  bradycardia Will check TSH

## 2023-08-03 NOTE — Telephone Encounter (Signed)
 Pt's PCP is [name], was seen by Dr. Sherlynn in the office today. Pt's daughter would like a letter stating that she [name]) is the sole caretaker for her mother, the pt, Michelle Rowe.

## 2023-08-03 NOTE — Telephone Encounter (Signed)
 Letter has been typed by Dr.Veludandi and given to me. I have called patient daughter and she has requested that we mail this letter to her versus picking it up in office. I have placed in outgoing mail box.

## 2023-08-04 ENCOUNTER — Ambulatory Visit: Payer: Self-pay | Admitting: Sports Medicine

## 2023-08-04 LAB — HEMOGLOBIN A1C
Hgb A1c MFr Bld: 6.9 % — ABNORMAL HIGH (ref <5.7)
Mean Plasma Glucose: 151 mg/dL
eAG (mmol/L): 8.4 mmol/L

## 2023-08-04 LAB — TSH: TSH: 1.49 m[IU]/L (ref 0.40–4.50)

## 2023-08-04 NOTE — Telephone Encounter (Signed)
 Signed the letter and gave to CMA

## 2023-08-15 ENCOUNTER — Telehealth: Payer: Self-pay | Admitting: *Deleted

## 2023-08-15 DIAGNOSIS — F02818 Dementia in other diseases classified elsewhere, unspecified severity, with other behavioral disturbance: Secondary | ICD-10-CM

## 2023-08-15 DIAGNOSIS — M150 Primary generalized (osteo)arthritis: Secondary | ICD-10-CM

## 2023-08-15 DIAGNOSIS — R2681 Unsteadiness on feet: Secondary | ICD-10-CM

## 2023-08-15 NOTE — Telephone Encounter (Signed)
 Copied from CRM [id number]. Topic: Clinical - Medication Question >> Aug 15, 2023 11:41 AM Diannia H wrote: Reason for CRM: Patients daughter is calling because she is wanting to know if provider could right her a rx for Ensure. The patients daughter said care management stated to reach out to provider for this. She said they told her she could drink it as much as the provider allowed her to.

## 2023-08-16 MED ORDER — ENSURE PO LIQD: 237.0000 mL | Freq: Every day | ORAL | Status: DC

## 2023-08-16 NOTE — Telephone Encounter (Signed)
 I called patients daughter and she stated that her mother's case manager said that if we give them a script patient will not have to pay for ensure out of pocket.   I called Shenetta Tyrell (case manager) to get a fax number and she stated that if we will mail the order to the patient she will obtain it from her. Ms.Tyrell also stated that patient needs an order for a walker. Per Ms.Tyrell she will process the order for the walker through a company she is associated with.   Order pending for review and signature if provider in agreement

## 2023-08-16 NOTE — Telephone Encounter (Signed)
 Haile Toppins patient's daughter calling to speak with Ms. Shueneaka. Transferred to clinic.

## 2023-08-16 NOTE — Telephone Encounter (Signed)
 We do not do Rx for ensure but I would recommend her taking 1 day with smallest meal if she has concerns about her getting adequate nutrition.

## 2023-08-19 ENCOUNTER — Ambulatory Visit: Admitting: Family

## 2023-08-19 ENCOUNTER — Ambulatory Visit: Payer: Medicaid Other | Admitting: Family

## 2023-09-15 ENCOUNTER — Telehealth: Payer: Self-pay

## 2023-09-15 NOTE — Telephone Encounter (Signed)
 Copied from CRM #[id number]. Topic: Clinical - Order For Equipment >> Sep 15, 2023  3:37 PM Carrielelia G wrote: Raquel with [facility] calling on the the status of patient Michelle Rowe incontinence supplies: the certificate of need and letter of medical necessity   Please advise

## 2023-09-15 NOTE — Telephone Encounter (Signed)
 Cal;l returned to [facility] and I advised that we filled out a form and faxed in at the end of July and we are under the impresson that the order is good x 1 year. Per the receptionist, additional ites were added, which populated a new form that was faxed to our office on 09/09/23.  Form is in providers review and sign folder, once signed, we will submit.

## 2023-09-20 ENCOUNTER — Telehealth: Payer: Self-pay | Admitting: Family

## 2023-09-20 DIAGNOSIS — N1831 Chronic kidney disease, stage 3a: Secondary | ICD-10-CM

## 2023-09-20 NOTE — Telephone Encounter (Signed)
 Patient daughter called in stated that she missed an appointment in June for her mother at [facility] and now in order to reschedule  they need a  new Referral. Please advise and call Hopie Pellegrin when done.[email or web address]

## 2023-09-20 NOTE — Telephone Encounter (Signed)
 Nephrologist order placed.

## 2023-09-21 NOTE — Telephone Encounter (Signed)
 Called patient daughter Sharlet and no answer. Voicemail was left with office call back number.

## 2023-09-21 NOTE — Telephone Encounter (Signed)
 Patient daughter called the office back and she was notified about referral being placed by PCP Ngetich, Roxan BROCKS, NP

## 2023-09-27 ENCOUNTER — Telehealth: Payer: Self-pay

## 2023-09-27 NOTE — Telephone Encounter (Signed)
 I have faxed paper and also placed form into the to be scanned box.

## 2023-09-27 NOTE — Telephone Encounter (Signed)
 Copied from CRM #[id number]. Topic: Clinical - Order For Equipment >> Sep 15, 2023  3:37 PM [name] wrote: Raquel with [facility] calling on the the status of patient Wolters incontinence supplies: the certificate of need and letter of medical necessity   Please advise >> Sep 27, 2023 11:50 AM Zane F wrote: Caller: Almarie  Calling From: [facility]  Calling to confirm an update for the medical necessity letter needed for the patient. Call disconnected due to specialist audio issues  Please call them back with an update at [phone]  >> Sep 21, 2023  2:53 PM Ozell B wrote: See previous note. >> Sep 21, 2023  2:32 PM Charsetta H wrote: Message was sent to wrong office. >> Sep 21, 2023  2:24 PM Zane F wrote: Caller: Joann  Calling from: [employer] [facility]   Calling in to confirm if the office has sent over the letter of the medical necessity on the provider's letterhead with the outlined details sent over via fax on 09/09/2023. The details outlined are vital and required for insurance approval. Specialist did inform the caller that the office received the documentation and it is in the provider's inbox to review and to please allow us  7-10 business days to submit the letter back to their office.  Callback Number: [phone]  Fax Number: [phone]

## 2023-09-27 NOTE — Telephone Encounter (Signed)
 I have called [facility] and requested another fax for this form be sent over. Im unable to locate this form inside the office.I have placed fax into provider review and sign folder. Message routed to PCP Ngetich, Roxan BROCKS, NP

## 2023-09-27 NOTE — Telephone Encounter (Signed)
 Medical Necessity letter written for incontinent supplies.Please fax as requested [facility] [phone]

## 2023-10-09 ENCOUNTER — Other Ambulatory Visit: Payer: Self-pay | Admitting: Family

## 2023-10-09 DIAGNOSIS — I10 Essential (primary) hypertension: Secondary | ICD-10-CM

## 2023-10-26 ENCOUNTER — Encounter: Payer: Self-pay | Admitting: Family

## 2023-10-26 ENCOUNTER — Ambulatory Visit (INDEPENDENT_AMBULATORY_CARE_PROVIDER_SITE_OTHER): Admitting: Family

## 2023-10-26 VITALS — BP 128/74 | HR 78 | Temp 97.8°F | Resp 18 | Ht 62.0 in | Wt 223.6 lb

## 2023-10-26 DIAGNOSIS — B372 Candidiasis of skin and nail: Secondary | ICD-10-CM

## 2023-10-26 DIAGNOSIS — R2681 Unsteadiness on feet: Secondary | ICD-10-CM | POA: Diagnosis not present

## 2023-10-26 DIAGNOSIS — G301 Alzheimer's disease with late onset: Secondary | ICD-10-CM

## 2023-10-26 DIAGNOSIS — I10 Essential (primary) hypertension: Secondary | ICD-10-CM

## 2023-10-26 DIAGNOSIS — E785 Hyperlipidemia, unspecified: Secondary | ICD-10-CM

## 2023-10-26 DIAGNOSIS — N184 Chronic kidney disease, stage 4 (severe): Secondary | ICD-10-CM

## 2023-10-26 DIAGNOSIS — E1122 Type 2 diabetes mellitus with diabetic chronic kidney disease: Secondary | ICD-10-CM | POA: Diagnosis not present

## 2023-10-26 DIAGNOSIS — J302 Other seasonal allergic rhinitis: Secondary | ICD-10-CM

## 2023-10-26 DIAGNOSIS — M1A9XX0 Chronic gout, unspecified, without tophus (tophi): Secondary | ICD-10-CM | POA: Diagnosis not present

## 2023-10-26 DIAGNOSIS — F411 Generalized anxiety disorder: Secondary | ICD-10-CM

## 2023-10-26 DIAGNOSIS — F0280 Dementia in other diseases classified elsewhere without behavioral disturbance: Secondary | ICD-10-CM

## 2023-10-26 MED ORDER — GLIPIZIDE 5 MG PO TABS: 5.0000 mg | ORAL_TABLET | Freq: Every day | ORAL | 1 refills | Status: DC

## 2023-10-26 MED ORDER — FLUTICASONE PROPIONATE 50 MCG/ACT NA SUSP: NASAL | 1 refills | Status: AC

## 2023-10-26 MED ORDER — AMLODIPINE BESYLATE 10 MG PO TABS: 10.0000 mg | ORAL_TABLET | Freq: Every day | ORAL | 3 refills | Status: AC

## 2023-10-26 MED ORDER — DIVALPROEX SODIUM 125 MG PO DR TAB: 125.0000 mg | DELAYED_RELEASE_TABLET | Freq: Two times a day (BID) | ORAL | 1 refills | Status: DC

## 2023-10-26 MED ORDER — GUAIFENESIN ER 600 MG PO TB12: 600.0000 mg | ORAL_TABLET | Freq: Two times a day (BID) | ORAL | 0 refills | Status: AC | PRN

## 2023-10-26 MED ORDER — AZELASTINE HCL 0.1 % NA SOLN: 1.0000 | Freq: Two times a day (BID) | NASAL | 6 refills | Status: AC

## 2023-10-26 MED ORDER — ASPIRIN 81 MG PO CHEW
81.0000 mg | CHEWABLE_TABLET | Freq: Every day | ORAL | 11 refills | Status: AC
Start: 2023-10-26 — End: ?

## 2023-10-26 MED ORDER — LORATADINE 10 MG PO TABS: 10.0000 mg | ORAL_TABLET | Freq: Every day | ORAL | 3 refills | Status: AC | PRN

## 2023-10-26 MED ORDER — LORAZEPAM 0.5 MG PO TABS: 0.5000 mg | ORAL_TABLET | Freq: Two times a day (BID) | ORAL | 3 refills | Status: AC | PRN

## 2023-10-26 MED ORDER — ALLOPURINOL 100 MG PO TABS: 100.0000 mg | ORAL_TABLET | Freq: Every day | ORAL | 3 refills | Status: DC

## 2023-10-26 MED ORDER — GLUCOSE BLOOD VI STRP: 1.0000 | ORAL_STRIP | 3 refills | Status: AC | PRN

## 2023-10-26 MED ORDER — LOSARTAN POTASSIUM 50 MG PO TABS: 50.0000 mg | ORAL_TABLET | Freq: Every day | ORAL | 1 refills | Status: AC

## 2023-10-26 MED ORDER — LANCETS ULTRA THIN 30G MISC: 1.0000 | Freq: Every day | 3 refills | Status: AC

## 2023-10-26 MED ORDER — SIMVASTATIN 10 MG PO TABS: 10.0000 mg | ORAL_TABLET | Freq: Every day | ORAL | 1 refills | Status: AC

## 2023-10-26 MED ORDER — HYDRALAZINE HCL 25 MG PO TABS: 25.0000 mg | ORAL_TABLET | Freq: Three times a day (TID) | ORAL | 1 refills | Status: AC

## 2023-10-26 MED ORDER — IPRATROPIUM BROMIDE 0.06 % NA SOLN: 2.0000 | Freq: Four times a day (QID) | NASAL | 6 refills | Status: DC

## 2023-10-26 MED ORDER — NYSTATIN 100000 UNIT/GM EX CREA: 1.0000 | TOPICAL_CREAM | Freq: Two times a day (BID) | CUTANEOUS | 3 refills | Status: DC

## 2023-10-26 NOTE — Patient Instructions (Signed)
 1.Go to local pharmacy to receive Covid vaccine.

## 2023-10-26 NOTE — Progress Notes (Signed)
 Provider: Roxan Plough FNP-C   Donice Alperin, Roxan BROCKS, NP  Patient Care Team: Trecia Maring, Roxan BROCKS, NP as PCP - General (Family Medicine)  Extended Emergency Contact Information Primary Emergency Contact: Canales,Pamela Home Phone: [phone] Mobile Phone: [phone] Relation: Daughter Secondary Emergency Contact: [facility] Phone: [phone] Mobile Phone: [phone] Relation: Daughter  Code Status:  Full Code  Goals of care: Advanced Directive information    10/26/2023    9:56 AM  Advanced Directives  Does Patient Have a Medical Advance Directive? No  Would patient like information on creating a medical advance directive? No - Patient declined     Chief Complaint  Patient presents with   Follow-up    1 Month follow up.     Discussed the use of AI scribe software for clinical note transcription with the patient, who gave verbal consent to proceed.  History of Present Illness   Michelle Rowe is an 83 year old female with diabetes and hypertension who presents for a six-month follow-up visit. She is here with her daughter who is the care giver.  She has gained a small amount of weight since the last visit, currently weighing 223.6 pounds, up from 222 pounds. Her appetite remains good, and she has not experienced any falls. She continues to attend a day program from Monday to Friday.  There are concerns about her diabetes management, specifically regarding a prescription for a diabetic supplement that was not received. There is uncertainty about whether it was Glucerna or another product. Her current diabetic medication includes glipizide  5 mg.  She has been experiencing episodes of shortness of breath, particularly when walking short distances, such as to the receptionist's desk. There is no wheezing, but she does experience swelling in her ankles that comes and goes. She uses a cane for mobility and does not have a walker or rollator, which her caregiver is  considering for her.  Her current medications include hydralazine  25 mg three times daily, losartan  50 mg daily, and amlodipine  10 mg for blood pressure management. Her caregiver inquires about the possibility of reducing her blood pressure medications since her blood pressure readings have been stable.  She has been experiencing increased bowel movements and occasional diarrhea. Her caregiver notes that she complained of stomach pain the previous day, which is unusual for her. She has not been fed this morning, and there is a plan to conduct blood work to investigate further.  She uses incontinence pads at night, but there are issues with leakage despite double briefing. There is a question about obtaining additional pads to use with her pull-ups, as she often soaks through them at night.   Past Medical History:  Diagnosis Date   Anterolisthesis    Grade 1 of L3 on L4 on x-ray 07/18/19   Aortic regurgitation    Seen on 2D echo 07/07/16   Cataract    CKD (chronic kidney disease), stage III (HCC)    DDD (degenerative disc disease), lumbar    Diabetes mellitus without complication (HCC)    Diastolic CHF (HCC)    Grade 2/4 with LVEF of 60-65%   Diastolic dysfunction    Grade 2/4 with LVEF of 60-65% and LVH   Gout    HLD (hyperlipidemia)    Hyperparathyroidism (HCC)    Hypertension    Lumbar facet arthropathy    Memory impairment    Mitral valve regurgitation    Seen on 2D echo 07/07/16   OA (osteoarthritis)    OSA (obstructive sleep apnea)  Osteoarthritis    Polycythemia    Shingles    Sleep apnea    Thrombocytopenia (HCC)    Vitamin D deficiency    Past Surgical History:  Procedure Laterality Date   LAPAROSCOPIC HYSTERECTOMY      Allergies  Allergen Reactions   Augmentin [Amoxicillin-Pot Clavulanate] Rash    Critically high - Onset 07/13/2019   Amoxicillin     Did it involve swelling of the face/tongue/throat, SOB, or low BP? Y Did it involve sudden or severe rash/hives,  skin peeling, or any reaction on the inside of your mouth or nose? Y (Rash, Swelling, SOB) Did you need to seek medical attention at a hospital or doctor's office? Y When did it last happen?  Yesterday     If all above answers are "NO", may proceed with cephalosporin use.    Shellfish Allergy      Allergies as of 10/26/2023       Reactions   Augmentin [amoxicillin-pot Clavulanate] Rash   Critically high - Onset 07/13/2019   Amoxicillin    Did it involve swelling of the face/tongue/throat, SOB, or low BP? Y Did it involve sudden or severe rash/hives, skin peeling, or any reaction on the inside of your mouth or nose? Y (Rash, Swelling, SOB) Did you need to seek medical attention at a hospital or doctor's office? Y When did it last happen?  Yesterday     If all above answers are "NO", may proceed with cephalosporin use.   Shellfish Allergy          Medication List        Accurate as of October 26, 2023  9:04 PM. If you have any questions, ask your nurse or doctor.          allopurinol  100 MG tablet Commonly known as: ZYLOPRIM  Take 1 tablet (100 mg total) by mouth daily.   amLODipine  10 MG tablet Commonly known as: NORVASC  Take 1 tablet (10 mg total) by mouth daily. Needs an appointment before anymore future refills.   aspirin  81 MG chewable tablet Chew 1 tablet (81 mg total) by mouth daily.   azelastine  0.1 % nasal spray Commonly known as: ASTELIN  Place 1 spray into both nostrils 2 (two) times daily. Use in each nostril as directed   Blood Glucose Monitoring Suppl Devi 1 each by Does not apply route in the morning, at noon, and at bedtime. May substitute to any manufacturer covered by patient's insurance.   divalproex  125 MG DR tablet Commonly known as: DEPAKOTE  Take 1 tablet (125 mg total) by mouth 2 (two) times daily.   Ensure Take 237 mLs by mouth daily. With smallest meal   EPINEPHrine  0.3 mg/0.3 mL Soaj injection Commonly known as: EpiPen  2-Pak Inject 0.3  mg into the muscle as needed for anaphylaxis.   fluticasone  50 MCG/ACT nasal spray Commonly known as: FLONASE  USE 1 SPRAY(S) IN EACH NOSTRIL TWICE DAILY AS NEEDED FOR ALLERGIES   glipiZIDE  5 MG tablet Commonly known as: GLUCOTROL  Take 1 tablet (5 mg total) by mouth daily.   glucose blood test strip 1 each by Other route as needed for other. Use as instructed   guaiFENesin  600 MG 12 hr tablet Commonly known as: MUCINEX  Take 1 tablet (600 mg total) by mouth 2 (two) times daily as needed.   hydrALAZINE  25 MG tablet Commonly known as: APRESOLINE  Take 1 tablet (25 mg total) by mouth 3 (three) times daily.   ipratropium 0.06 % nasal spray Commonly known as: ATROVENT  Place 2 sprays  into both nostrils 4 (four) times daily.   Lancets Ultra Thin 30G Misc Inject 1 each into the skin daily.   loratadine  10 MG tablet Commonly known as: CLARITIN  Take 1 tablet (10 mg total) by mouth daily as needed for allergies.   LORazepam  0.5 MG tablet Commonly known as: ATIVAN  Take 1 tablet (0.5 mg total) by mouth 2 (two) times daily as needed. for anxiety   losartan  50 MG tablet Commonly known as: COZAAR  Take 1 tablet (50 mg total) by mouth daily.   nystatin  cream Commonly known as: MYCOSTATIN  Apply 1 Application topically 2 (two) times daily.   simvastatin  10 MG tablet Commonly known as: ZOCOR  Take 1 tablet (10 mg total) by mouth at bedtime.               Durable Medical Equipment  (From admission, onward)           Start     Ordered   10/26/23 0000  For home use only DME Other see comment       Comments: Rolator walker  Sig.Use daily when walking  Dx : Unsteady gait Weight : 223 lbs  Question:  Length of Need  Answer:  Lifetime   10/26/23 1044            Review of Systems  Unable to perform ROS: Dementia (Information provided by patient's daughter)  Constitutional:  Negative for appetite change, chills, fatigue, fever and unexpected weight change.  HENT:   Negative for congestion, dental problem, ear discharge, ear pain, facial swelling, hearing loss, nosebleeds, postnasal drip, rhinorrhea, sinus pressure, sinus pain, sneezing, sore throat, tinnitus and trouble swallowing.        Seasonal allergies   Eyes:  Negative for pain, discharge, redness, itching and visual disturbance.  Respiratory:  Negative for cough, chest tightness and wheezing.        Dyspnea on exertion   Cardiovascular:  Positive for leg swelling. Negative for chest pain and palpitations.  Gastrointestinal:  Negative for abdominal distention, abdominal pain, blood in stool, constipation, diarrhea, nausea and vomiting.  Endocrine: Negative for cold intolerance, heat intolerance, polydipsia, polyphagia and polyuria.  Genitourinary:  Negative for difficulty urinating, dysuria, flank pain, frequency and urgency.  Musculoskeletal:  Positive for arthralgias and gait problem. Negative for back pain, joint swelling, myalgias, neck pain and neck stiffness.  Skin:  Negative for color change, pallor, rash and wound.  Neurological:  Negative for dizziness, syncope, speech difficulty, weakness, light-headedness, numbness and headaches.  Hematological:  Does not bruise/bleed easily.  Psychiatric/Behavioral:  Negative for agitation, behavioral problems, confusion, hallucinations, self-injury, sleep disturbance and suicidal ideas. The patient is not nervous/anxious.        Memory loss    Immunization History  Administered Date(s) Administered   Fluad Quad(high Dose 65+) 11/08/2019   Fluad Trivalent(High Dose 65+) 02/18/2023, 10/05/2023   INFLUENZA, HIGH DOSE SEASONAL PF 10/03/2021   Influenza-Unspecified 10/31/2018, 10/16/2020   Moderna Sars-Covid-2 Vaccination 04/25/2019, 05/23/2019, 12/25/2019   Pneumococcal Conjugate-13 05/06/2015   Pneumococcal Polysaccharide-23 10/30/2013   Tdap 12/31/2013   Zoster Recombinant(Shingrix) 10/16/2020, 03/28/2021   Pertinent  Health Maintenance Due  Topic  Date Due   FOOT EXAM  Never done   OPHTHALMOLOGY EXAM  11/29/2018   HEMOGLOBIN A1C  02/02/2024   Influenza Vaccine  Completed   DEXA SCAN  Completed      04/14/2022   10:15 AM 08/03/2023   10:17 AM 08/03/2023   10:20 AM 08/03/2023   10:28 AM 10/26/2023  9:55 AM  Fall Risk  Falls in the past year? 1 0 0 0 0  Was there an injury with Fall? 1 0 1 1 0  Fall Risk Category Calculator 3 0 1 1 0  Patient at Risk for Falls Due to History of fall(s) No Fall Risks   No Fall Risks  Fall risk Follow up  Falls evaluation completed   Falls evaluation completed   Functional Status Survey:    Vitals:   10/26/23 1004  BP: 128/74  Pulse: 78  Resp: 18  Temp: 97.8 F (36.6 C)  SpO2: 97%  Weight: 223 lb 9.6 oz (101.4 kg)  Height: 5' 2 (1.575 m)   Body mass index is 40.9 kg/m. Physical Exam VITALS: T- 97.8, P- 78, BP- 128/74, SaO2- 97% MEASUREMENTS: Weight- 223.6. GENERAL: Alert, cooperative, well developed, no acute distress HEENT: Normocephalic, normal oropharynx, moist mucous membranes, ears normal, nose normal, no sinus tenderness NECK: Neck normal CHEST: Clear to auscultation bilaterally, no wheezes, rhonchi, or crackles CARDIOVASCULAR: Normal heart rate and rhythm, S1 and S2 normal without murmurs ABDOMEN: Soft, non-tender, non-distended, without organomegaly, normal bowel sounds EXTREMITIES: No cyanosis, mild leg swelling NEUROLOGICAL: Cranial nerves grossly intact, moves all extremities without gross motor or sensory deficit, sensation intact in feet  SKIN: No rash,no lesion or erythema   PSYCHIATRY/BEHAVIORAL: Mood stable memory loss     Labs reviewed: Recent Labs    02/18/23 0954 07/31/23 2136  NA 140 142  K 4.7 4.5  CL 108 113*  CO2 23 20*  GLUCOSE 141* 103*  BUN 24 35*  CREATININE 1.26* 1.51*  CALCIUM 9.9 9.3   Recent Labs    02/18/23 0954  AST 19  ALT 12  BILITOT 0.5  PROT 7.6   Recent Labs    02/18/23 0954 07/31/23 2136  WBC 4.3 6.4  NEUTROABS 2,004   --   HGB 13.1 11.6*  HCT 40.1 37.4  MCV 94.6 101.1*  PLT 284 212   Lab Results  Component Value Date   TSH 1.49 08/03/2023   Lab Results  Component Value Date   HGBA1C 6.9 (H) 08/03/2023   Lab Results  Component Value Date   CHOL 164 02/18/2023   HDL 58 02/18/2023   LDLCALC 85 02/18/2023   TRIG 117 02/18/2023   CHOLHDL 2.8 02/18/2023    Significant Diagnostic Results in last 30 days:  No results found.  Assessment/Plan  Type 2 diabetes mellitus with diabetic chronic kidney disease Blood sugar control is crucial to prevent further kidney damage. - Send Glycine to see if she likes it as a supplement - Continue glipizide  5 mg for diabetes - Provide diabetic supplies  Essential hypertension Hypertension is well-controlled with the current medication regimen. Blood pressure today is 128/74 mmHg. - Continue hydralazine  25 mg three times daily - Continue losartan  50 mg daily - Continue amlodipine  10 mg daily  Hyperlipidemia Hyperlipidemia is managed with simvastatin . - Continue simvastatin  10 mg daily  Lower extremity edema Intermittent lower extremity edema, currently present. No pain reported in the calf muscles. - Elevate legs when sitting in the lift chair  Dyspnea on exertion Dyspnea on exertion noted, especially when walking short distances. No wheezing reported. Swelling in the legs comes and goes. - Bilateral breath sounds clear to Auscultation  Alzheimer's disease, late onset Late onset Alzheimer's disease. Lorazepam  is used to help with anxiety and restlessness, especially at night. - Continue lorazepam  0.5 mg twice daily as needed  Generalized anxiety disorder Generalized anxiety disorder  is managed with lorazepam , which helps keep her calm, especially at night. - Continue lorazepam  0.5 mg twice daily as needed  Urinary incontinence Urinary incontinence managed with incontinence pads. Double briefed at night due to soaking through. - Continue using  incontinence pads - Discussed the possibility of using additional pads in pull-ups - No lower extremities edema noted  Chronic gout without tophus Chronic gout is managed with allopurinol . - Continue allopurinol   Candidiasis of skin and nail Intermittent candidiasis of the skin. Nystatin  cream is used for treatment when redness or breakdown occurs. - Refill nystatin  cream  Other seasonal allergic rhinitis Seasonal allergic rhinitis is managed with Claritin  and nasal sprays. Symptoms include a runny nose, especially during allergy  season. - Continue Claritin  - Continue nasal spray - Continue Flonase    Family/ staff Communication: Reviewed plan of care with patient verbalized understanding   Labs/tests ordered:  - CBC with Differential/Platelet - CMP with eGFR([facility]) - TSH - Hgb A1C - Lipid panel  Next Appointment : Return in about 6 months (around 04/24/2024) for medical mangement of chronic issues.SABRA   Spent 30 minutes of Face to face and non-face to face with patient  >50% time spent counseling; reviewing medical record; tests; labs; documentation and developing future plan of care.   Roxan JAYSON Plough, NP

## 2023-10-27 ENCOUNTER — Ambulatory Visit: Payer: Self-pay | Admitting: Family

## 2023-10-27 LAB — COMPREHENSIVE METABOLIC PANEL WITH GFR
AG Ratio: 1.1 (calc) (ref 1.0–2.5)
ALT: 29 U/L (ref 6–29)
AST: 21 U/L (ref 10–35)
Albumin: 4 g/dL (ref 3.6–5.1)
Alkaline phosphatase (APISO): 71 U/L (ref 37–153)
BUN/Creatinine Ratio: 19 (calc) (ref 6–22)
BUN: 31 mg/dL — ABNORMAL HIGH (ref 7–25)
CO2: 21 mmol/L (ref 20–32)
Calcium: 9.7 mg/dL (ref 8.6–10.4)
Chloride: 108 mmol/L (ref 98–110)
Creat: 1.66 mg/dL — ABNORMAL HIGH (ref 0.60–0.95)
Globulin: 3.5 g/dL (ref 1.9–3.7)
Glucose, Bld: 120 mg/dL — ABNORMAL HIGH (ref 65–99)
Potassium: 5.6 mmol/L — ABNORMAL HIGH (ref 3.5–5.3)
Sodium: 139 mmol/L (ref 135–146)
Total Bilirubin: 0.3 mg/dL (ref 0.2–1.2)
Total Protein: 7.5 g/dL (ref 6.1–8.1)
eGFR: 30 mL/min/1.73m2 — ABNORMAL LOW (ref >=60)

## 2023-10-27 LAB — CBC WITH DIFFERENTIAL/PLATELET
Absolute Lymphocytes: 2096 {cells}/uL (ref 850–3900)
Absolute Monocytes: 479 {cells}/uL (ref 200–950)
Basophils Absolute: 31 {cells}/uL (ref 0–200)
Basophils Relative: 0.6 %
Eosinophils Absolute: 10 {cells}/uL — ABNORMAL LOW (ref 15–500)
Eosinophils Relative: 0.2 %
HCT: 38.6 % (ref 35.0–45.0)
Hemoglobin: 12.5 g/dL (ref 11.7–15.5)
MCH: 31.2 pg (ref 27.0–33.0)
MCHC: 32.4 g/dL (ref 32.0–36.0)
MCV: 96.3 fL (ref 80.0–100.0)
MPV: 9.6 fL (ref 7.5–12.5)
Monocytes Relative: 9.4 %
Neutro Abs: 2484 {cells}/uL (ref 1500–7800)
Neutrophils Relative %: 48.7 %
Platelets: 263 Thousand/uL (ref 140–400)
RBC: 4.01 Million/uL (ref 3.80–5.10)
RDW: 12 % (ref 11.0–15.0)
Total Lymphocyte: 41.1 %
WBC: 5.1 Thousand/uL (ref 3.8–10.8)

## 2023-10-27 LAB — LIPID PANEL
Cholesterol: 156 mg/dL (ref <200)
HDL: 53 mg/dL (ref >=50)
LDL Cholesterol (Calc): 83 mg/dL
Non-HDL Cholesterol (Calc): 103 mg/dL (ref <130)
Total CHOL/HDL Ratio: 2.9 (calc) (ref <5.0)
Triglycerides: 108 mg/dL (ref <150)

## 2023-10-27 LAB — HEMOGLOBIN A1C
Hgb A1c MFr Bld: 6.7 % — ABNORMAL HIGH (ref <5.7)
Mean Plasma Glucose: 146 mg/dL
eAG (mmol/L): 8.1 mmol/L

## 2023-10-27 LAB — TSH: TSH: 1.4 m[IU]/L (ref 0.40–4.50)

## 2023-11-16 ENCOUNTER — Telehealth: Payer: Self-pay

## 2023-11-16 NOTE — Telephone Encounter (Signed)
 Please call patient daughter Sharlet and schedule for virtual visit with whomever has availability. Message routed to [employer].

## 2023-11-16 NOTE — Telephone Encounter (Signed)
 Please schedule virtual visit to discuss options for medication.

## 2023-11-16 NOTE — Telephone Encounter (Signed)
 Message routed to PCP Ngetich, Roxan BROCKS, NP. Please advise.

## 2023-11-16 NOTE — Telephone Encounter (Signed)
 Copied from CRM [id number]. Topic: Clinical - Medical Advice >> Nov 16, 2023  2:15 PM Alfonso ORN wrote: Reason for CRM: patient goes to a daycenter and received a call from the daycenter that patient is cursing the nurses ,when try to take pt to the bathroom or give medication , daughter stated pt does that to her sometimes.   Rosabell Geyer Daughter [phone] ask if [name] C,Ngetich can give daughter a call or prescribe pt some medication to send to pharmacy to calm pt down while at the daycenter , if not able to call if can prescribe medication and send to pharmacy

## 2023-11-17 ENCOUNTER — Telehealth: Payer: Self-pay | Admitting: Family

## 2023-11-17 ENCOUNTER — Ambulatory Visit: Admitting: Family

## 2023-11-17 ENCOUNTER — Encounter: Payer: Self-pay | Admitting: Family

## 2023-11-17 VITALS — BP 130/78 | HR 60 | Temp 97.8°F | Resp 19 | Ht 62.0 in | Wt 224.4 lb

## 2023-11-17 DIAGNOSIS — R410 Disorientation, unspecified: Secondary | ICD-10-CM | POA: Diagnosis not present

## 2023-11-17 DIAGNOSIS — F0280 Dementia in other diseases classified elsewhere without behavioral disturbance: Secondary | ICD-10-CM

## 2023-11-17 DIAGNOSIS — G301 Alzheimer's disease with late onset: Secondary | ICD-10-CM

## 2023-11-17 MED ORDER — REXULTI 0.5 MG PO TABS: ORAL_TABLET | ORAL | 0 refills | Status: AC

## 2023-11-17 MED ORDER — DIVALPROEX SODIUM 250 MG PO DR TAB: 250.0000 mg | DELAYED_RELEASE_TABLET | Freq: Two times a day (BID) | ORAL | 3 refills | Status: DC

## 2023-11-17 NOTE — Telephone Encounter (Signed)
 Patient's daughter request Medlist send to patient's adult care center. Please fax patient's medication list to [facility] : Attention to Director of Nursing  Fax # [phone]

## 2023-11-17 NOTE — Telephone Encounter (Signed)
The patient is scheduled for an appointment today.

## 2023-11-17 NOTE — Progress Notes (Addendum)
 Provider: Roxan Plough FNP-C  Verl Whitmore, Roxan BROCKS, NP  Patient Care Team: Terren Haberle, Roxan BROCKS, NP as PCP - General (Family Medicine)  Extended Emergency Contact Information Primary Emergency Contact: Rocchi,Pamela Home Phone: [phone] Mobile Phone: [phone] Relation: Daughter Secondary Emergency Contact: [facility] Phone: [phone] Mobile Phone: [phone] Relation: Daughter  Code Status:Full Code  Goals of care: Advanced Directive information    10/26/2023    9:56 AM  Advanced Directives  Does Patient Have a Medical Advance Directive? No  Would patient like information on creating a medical advance directive? No - Patient declined     Chief Complaint  Patient presents with   REFUSING CARE   Discussed the use of AI scribe software for clinical note transcription with the patient, who gave verbal consent to proceed.  History of Present Illness   Michelle Rowe is an 83 year old female with dementia who presents with behavioral issues at her day center.  She is experiencing behavioral issues at her day center, where she refuses assistance with toileting every two hours. This behavior is problematic for the center, which manages many participants, and it is considering suspending her participation if it continues. She attends the center from 8:00 AM to 5:30 PM. At home, she exhibits increased cursing, although her caregiver manages her behavior more effectively than the staff at the center.  She is currently on Ativan , taken twice daily, and Depakote , at a dose of 125 mg twice daily. Despite this, she does not allow the staff to assist her with toileting, although she is not aggressive or fighting them. Her caregiver reports that she initially did well on the current low dose of Depakote , but her behavior has since become more challenging.   Past Medical History:  Diagnosis Date   Anterolisthesis    Grade 1 of L3 on L4 on x-ray 07/18/19   Aortic  regurgitation    Seen on 2D echo 07/07/16   Cataract    CKD (chronic kidney disease), stage III (HCC)    DDD (degenerative disc disease), lumbar    Diabetes mellitus without complication (HCC)    Diastolic CHF (HCC)    Grade 2/4 with LVEF of 60-65%   Diastolic dysfunction    Grade 2/4 with LVEF of 60-65% and LVH   Gout    HLD (hyperlipidemia)    Hyperparathyroidism    Hypertension    Lumbar facet arthropathy    Memory impairment    Mitral valve regurgitation    Seen on 2D echo 07/07/16   OA (osteoarthritis)    OSA (obstructive sleep apnea)    Osteoarthritis    Polycythemia    Shingles    Sleep apnea    Thrombocytopenia    Vitamin D deficiency    Past Surgical History:  Procedure Laterality Date   LAPAROSCOPIC HYSTERECTOMY      Allergies  Allergen Reactions   Augmentin [Amoxicillin-Pot Clavulanate] Rash    Critically high - Onset 07/13/2019   Amoxicillin     Did it involve swelling of the face/tongue/throat, SOB, or low BP? Y Did it involve sudden or severe rash/hives, skin peeling, or any reaction on the inside of your mouth or nose? Y (Rash, Swelling, SOB) Did you need to seek medical attention at a hospital or doctor's office? Y When did it last happen?  Yesterday     If all above answers are "NO", may proceed with cephalosporin use.    Shellfish Allergy      Outpatient Encounter Medications as of  11/17/2023  Medication Sig   allopurinol  (ZYLOPRIM ) 100 MG tablet Take 1 tablet (100 mg total) by mouth daily.   amLODipine  (NORVASC ) 10 MG tablet Take 1 tablet (10 mg total) by mouth daily. Needs an appointment before anymore future refills.   aspirin  81 MG chewable tablet Chew 1 tablet (81 mg total) by mouth daily.   azelastine  (ASTELIN ) 0.1 % nasal spray Place 1 spray into both nostrils 2 (two) times daily. Use in each nostril as directed   Blood Glucose Monitoring Suppl DEVI 1 each by Does not apply route in the morning, at noon, and at bedtime. May substitute to any  manufacturer covered by patient's insurance.   Brexpiprazole (REXULTI) 0.5 MG TABS Take 1 tablet (0.5 mg total) by mouth daily for 7 days, THEN 2 tablets (1 mg total) daily for 7 days, THEN 4 tablets (2 mg total) daily for 7 days.   EPINEPHrine  (EPIPEN  2-PAK) 0.3 mg/0.3 mL IJ SOAJ injection Inject 0.3 mg into the muscle as needed for anaphylaxis.   fluticasone  (FLONASE ) 50 MCG/ACT nasal spray USE 1 SPRAY(S) IN EACH NOSTRIL TWICE DAILY AS NEEDED FOR ALLERGIES   glipiZIDE  (GLUCOTROL ) 5 MG tablet Take 1 tablet (5 mg total) by mouth daily.   glucose blood test strip 1 each by Other route as needed for other. Use as instructed   guaiFENesin  (MUCINEX ) 600 MG 12 hr tablet Take 1 tablet (600 mg total) by mouth 2 (two) times daily as needed.   hydrALAZINE  (APRESOLINE ) 25 MG tablet Take 1 tablet (25 mg total) by mouth 3 (three) times daily.   ipratropium (ATROVENT ) 0.06 % nasal spray Place 2 sprays into both nostrils 4 (four) times daily.   Lancets Ultra Thin 30G MISC Inject 1 each into the skin daily.   loratadine  (CLARITIN ) 10 MG tablet Take 1 tablet (10 mg total) by mouth daily as needed for allergies.   LORazepam  (ATIVAN ) 0.5 MG tablet Take 1 tablet (0.5 mg total) by mouth 2 (two) times daily as needed. for anxiety   losartan  (COZAAR ) 50 MG tablet Take 1 tablet (50 mg total) by mouth daily.   nystatin  cream (MYCOSTATIN ) Apply 1 Application topically 2 (two) times daily.   simvastatin  (ZOCOR ) 10 MG tablet Take 1 tablet (10 mg total) by mouth at bedtime.   [DISCONTINUED] divalproex  (DEPAKOTE ) 125 MG DR tablet Take 1 tablet (125 mg total) by mouth 2 (two) times daily.   divalproex  (DEPAKOTE ) 250 MG DR tablet Take 1 tablet (250 mg total) by mouth 2 (two) times daily.   Ensure (ENSURE) Take 237 mLs by mouth daily. With smallest meal (Patient not taking: Reported on 11/17/2023)   No facility-administered encounter medications on file as of 11/17/2023.    Review of Systems  Constitutional:  Negative for  appetite change, chills, fatigue, fever and unexpected weight change.  HENT:  Negative for congestion, dental problem, ear discharge, ear pain, facial swelling, hearing loss, nosebleeds, postnasal drip, rhinorrhea, sinus pressure, sinus pain, sneezing, sore throat, tinnitus and trouble swallowing.   Eyes:  Negative for pain, discharge, redness, itching and visual disturbance.  Respiratory:  Negative for cough, chest tightness, shortness of breath and wheezing.   Cardiovascular:  Negative for chest pain, palpitations and leg swelling.  Gastrointestinal:  Negative for abdominal distention, abdominal pain, blood in stool, constipation, diarrhea, nausea and vomiting.  Endocrine: Negative for cold intolerance, heat intolerance, polydipsia, polyphagia and polyuria.  Genitourinary:  Negative for difficulty urinating, dysuria, flank pain, frequency and urgency.  Musculoskeletal:  Negative for  arthralgias, back pain, gait problem, joint swelling, myalgias, neck pain and neck stiffness.  Skin:  Negative for color change, pallor, rash and wound.  Neurological:  Negative for dizziness, syncope, speech difficulty, weakness, light-headedness, numbness and headaches.  Hematological:  Does not bruise/bleed easily.  Psychiatric/Behavioral:  Negative for agitation, behavioral problems, confusion, hallucinations, self-injury, sleep disturbance and suicidal ideas. The patient is not nervous/anxious.     Immunization History  Administered Date(s) Administered   Fluad Quad(high Dose 65+) 11/08/2019   Fluad Trivalent(High Dose 65+) 02/18/2023, 10/05/2023   INFLUENZA, HIGH DOSE SEASONAL PF 10/03/2021   Influenza-Unspecified 10/31/2018, 10/16/2020   Moderna Sars-Covid-2 Vaccination 04/25/2019, 05/23/2019, 12/25/2019   Pneumococcal Conjugate-13 05/06/2015   Pneumococcal Polysaccharide-23 10/30/2013   Tdap 12/31/2013   Zoster Recombinant(Shingrix) 10/16/2020, 03/28/2021   Pertinent  Health Maintenance Due  Topic  Date Due   FOOT EXAM  Never done   OPHTHALMOLOGY EXAM  03/12/2021   HEMOGLOBIN A1C  04/24/2024   Influenza Vaccine  Completed   DEXA SCAN  Completed      04/14/2022   10:15 AM 08/03/2023   10:17 AM 08/03/2023   10:20 AM 08/03/2023   10:28 AM 10/26/2023    9:55 AM  Fall Risk  Falls in the past year? 1 0 0 0 0  Was there an injury with Fall? 1 0 1 1 0  Fall Risk Category Calculator 3 0 1 1 0  Patient at Risk for Falls Due to History of fall(s) No Fall Risks   No Fall Risks  Fall risk Follow up  Falls evaluation completed   Falls evaluation completed   Functional Status Survey:    Vitals:   11/17/23 1312  BP: 130/78  Pulse: 60  Resp: 19  Temp: 97.8 F (36.6 C)  SpO2: 99%  Weight: 224 lb 6.4 oz (101.8 kg)  Height: 5' 2 (1.575 m)   Body mass index is 41.04 kg/m. Physical Exam  GENERAL: Alert, cooperative, well developed, no acute distress. HEENT: Normocephalic, normal oropharynx, moist mucous membranes. CHEST: Clear to auscultation bilaterally, no wheezes, rhonchi, or crackles. CARDIOVASCULAR: Normal heart rate and rhythm, S1 and S2 normal without murmurs. ABDOMEN: Soft, non-tender to palpation, non-distended, without organomegaly, normal bowel sounds. EXTREMITIES: No cyanosis or edema. NEUROLOGICAL: Cranial nerves grossly intact, moves all extremities without gross motor or sensory deficit.  SKIN: No rash,no lesion or erythema   PSYCHIATRY/BEHAVIORAL: Memory Loss    Labs reviewed: Recent Labs    02/18/23 0954 07/31/23 2136 10/26/23 1050  NA 140 142 139  K 4.7 4.5 5.6*  CL 108 113* 108  CO2 23 20* 21  GLUCOSE 141* 103* 120*  BUN 24 35* 31*  CREATININE 1.26* 1.51* 1.66*  CALCIUM 9.9 9.3 9.7   Recent Labs    02/18/23 0954 10/26/23 1050  AST 19 21  ALT 12 29  BILITOT 0.5 0.3  PROT 7.6 7.5   Recent Labs    02/18/23 0954 07/31/23 2136 10/26/23 1050  WBC 4.3 6.4 5.1  NEUTROABS 2,004  --  2,484  HGB 13.1 11.6* 12.5  HCT 40.1 37.4 38.6  MCV 94.6 101.1*  96.3  PLT 284 212 263   Lab Results  Component Value Date   TSH 1.40 10/26/2023   Lab Results  Component Value Date   HGBA1C 6.7 (H) 10/26/2023   Lab Results  Component Value Date   CHOL 156 10/26/2023   HDL 53 10/26/2023   LDLCALC 83 10/26/2023   TRIG 108 10/26/2023   CHOLHDL 2.9 10/26/2023  Significant Diagnostic Results in last 30 days:  No results found.  Assessment/Plan  Behavioral symptoms of dementia Increased behavioral symptoms of dementia, including resistance to care, refusal to get up for bathroom breaks, and increased verbal aggression, causing significant issues at her day center. - Increase Depakote  to 250 mg in the evening and 125 mg in the morning. - Provide samples of Rexulti, starting with 0.5 mg daily for 7 days, then increase to 1 mg daily for another 7 days. Monitor response and adjust as needed. - Monitor for excessive drowsiness and adjust Depakote  dosing if necessary. - obtain urine specimen r/o UTI  Chronic right knee pain and swelling Chronic right knee pain and swelling, previously evaluated and determined to require a knee replacement, but she is not a candidate for surgery at this time. - continue current pain regimen   Family/ staff Communication: Reviewed plan of care with patient's daughter verbalized understanding   Labs/tests ordered: None   Next Appointment: Return in about 2 weeks (around 12/01/2023) for dementia .   Total time: 20 minutes. Greater than 50% of total time spent doing patient education regarding Dementia with behavioral disturbance,chronic right knee pain,health maintenance including symptom/medication management.   Roxan JAYSON Plough, NP

## 2023-11-18 NOTE — Telephone Encounter (Signed)
 Thank You.

## 2023-11-18 NOTE — Telephone Encounter (Signed)
 This has been faxed.

## 2023-11-21 ENCOUNTER — Telehealth: Payer: Self-pay

## 2023-11-21 NOTE — Telephone Encounter (Signed)
 Form completed and placed on out going fax box to be faxed as requested.

## 2023-11-21 NOTE — Telephone Encounter (Signed)
 I have completed Prior Authorization on CoverMyMeds. Papers from [facility] Medicaid have been faxed for patients Prior Authorization as well. I have placed into provider Rx folder for completion. Once papers are done please give to Clinical Intake to be faxed and scanned into patient chart. Message routed to PCP Ngetich, Roxan BROCKS, NP as FYI

## 2023-12-01 ENCOUNTER — Ambulatory Visit: Admitting: Family

## 2023-12-02 ENCOUNTER — Encounter: Admitting: Family

## 2023-12-02 NOTE — Progress Notes (Signed)
   This encounter was created in error - please disregard. No show

## 2023-12-08 ENCOUNTER — Telehealth: Payer: Self-pay

## 2023-12-08 NOTE — Telephone Encounter (Signed)
 Patient daugther Sharlet called and stated that patient needed her medication. Sharlet informed us  that pt really needs medication today and stated that [facility] informed her that they faxed information over to us  regarding medication needing to be filled out. PA was done on 10/13 and forms were signed by pts pcp and placed in outgoing fax. Informed patient that I would contact pharmacy.   Spoke with [facility] pharmacy she stated that its telling her a PA is needed as well as Medicaid needs to speak with provider regarding medication. The medication is Rexulti.

## 2023-12-08 NOTE — Telephone Encounter (Signed)
 Tried contacting ETTER Males ) pts dauther with an update on medication. Still waiting for PA , haven't received a response from pts pcp yet.

## 2023-12-08 NOTE — Telephone Encounter (Signed)
 Contacted pts insurance company they stated that the PA was denied because the pt had 1 failed preferred drug. The agent stated we can submit a new rx or another PA. Submitted new PA : Key: [id number] Routing to pts pcp for further review.

## 2023-12-08 NOTE — Telephone Encounter (Signed)
 Patient has failed medication Depakote .

## 2023-12-08 NOTE — Telephone Encounter (Signed)
Please verify which medication.

## 2023-12-09 NOTE — Telephone Encounter (Signed)
 Patient missed 2 weeks follow up appointment to evaluate after adjusting Depakote .Recommend schedule appointment to discuss treatment if Rexulti is not covered and if symptoms still persist.

## 2023-12-09 NOTE — Telephone Encounter (Signed)
 Noted , any other recommendation for me to inform patients daughter ?

## 2023-12-09 NOTE — Telephone Encounter (Signed)
 Tried contacting patients daughter regarding medication update. Unable to leave vm or get in touch.

## 2023-12-12 ENCOUNTER — Encounter: Payer: Self-pay | Admitting: Radiology

## 2023-12-13 ENCOUNTER — Telehealth: Payer: Self-pay

## 2023-12-13 NOTE — Telephone Encounter (Signed)
 Copied from CRM [id number]. Topic: Clinical - Medication Question >> Dec 13, 2023  1:34 PM Carrielelia G wrote: Reason for CRM: Please call regarding a discrepancy in a medication (name unknown by daughter)    Please advise

## 2023-12-13 NOTE — Telephone Encounter (Signed)
 Provider called [facility] was transferred three times.[employer] representative Delon stated coverage was denied will need an appeal prior to peer to peer review. May appeal at Telephone # [phone] option # 3 Fax # [phone]  Denial # [phone] for standard appeal within 30 days or fast appeal within 72 hrs.  Need : Cover sheet ,Provider Name,Address,reason for service and Member ID Please notify patient's daughter to call for appeal if Occupation service still required. Forms placed on outgoing fax box to be pickup.

## 2023-12-13 NOTE — Telephone Encounter (Addendum)
 Spoke with Michelle Rowe and she states that she is upset because her mother was started on Rexulti (not on active medication list)/given samples and it appeared to be working, now she has been off of it for 2 weeks because according to [facility] we must have dropped the ball with getting it authorized  I informed Pam that patient missed 2 week follow-up and needs to schedule an appointment (see previous phone note) and Pam states it's no need for an appointment, we need to work on getting a prior authorization for this medication  [name] please advise if a PA needs to be completed for Rexulti 1 mg? If so please add to patients medication list

## 2023-12-14 NOTE — Telephone Encounter (Addendum)
 In the meantime Michelle Rowe please advise if we should provide the patient with more samples and please identify what dose patient needs to be on

## 2023-12-14 NOTE — Telephone Encounter (Signed)
 I called Sharlet and relayed [name]'s response about her call to begin the peer-to-peer process. Sharlet verbalized understanding

## 2023-12-14 NOTE — Telephone Encounter (Signed)
 May provide samples while awaiting approval.

## 2023-12-15 NOTE — Telephone Encounter (Signed)
 Please verify with POA if patient how much she is taking current on the titration pack if she has started on 2 mg tablet.

## 2023-12-15 NOTE — Telephone Encounter (Signed)
 What sample dose would we provide for patient?

## 2023-12-16 MED ORDER — REXULTI 0.5 MG PO TABS: ORAL_TABLET | ORAL | Status: AC

## 2023-12-16 NOTE — Telephone Encounter (Signed)
 Patient had completed the titration pack and now has been off the medication completely for 2 or more weeks.

## 2023-12-16 NOTE — Telephone Encounter (Signed)
 Restart titration pack samples.

## 2023-12-16 NOTE — Addendum Note (Signed)
 Addended by: SUELLEN DEVIN BROCKS on: 12/16/2023 11:29 AM   Modules accepted: Orders

## 2023-12-16 NOTE — Telephone Encounter (Signed)
 Spoke with Pam and she plans to pick up titration pack

## 2023-12-19 ENCOUNTER — Telehealth: Payer: Self-pay | Admitting: Family

## 2023-12-19 NOTE — Telephone Encounter (Signed)
 [employer] for received states CSRA denied Rexulti  need to have failed two of Medicaid preferred -drug list.Please notify POA of denied medication.May ask for a state fair hearing by November ,17 th 2025 if needed.

## 2023-12-21 NOTE — Telephone Encounter (Signed)
 Outgoing call placed to patients daughter Joesphine), no answer, and no voicemail. We will need to make additional attempts to reach patient.

## 2023-12-26 NOTE — Telephone Encounter (Signed)
 Tried calling daughter, No Answer.

## 2023-12-27 NOTE — Telephone Encounter (Signed)
 Tried calling daughter, No Answer.

## 2024-01-04 ENCOUNTER — Telehealth: Payer: Self-pay

## 2024-01-04 NOTE — Telephone Encounter (Signed)
 Copied from CRM #[id number]. Topic: Clinical - Order For Equipment >> Jan 04, 2024  2:19 PM Cherylann RAMAN wrote: Reason for CRM: Latoya with Us  [facility] called requesting an update on Incontinence supplies for the patient. Order was faxed over on 11/12 and re-faxed on 11/19. Please contact Latoya at [phone].

## 2024-01-04 NOTE — Telephone Encounter (Signed)
 Form was in providers review and sign folder. Form completed today and placed in outgoing fax bin for administrative staff to further process.

## 2024-01-11 ENCOUNTER — Telehealth: Payer: Self-pay

## 2024-01-11 DIAGNOSIS — F0280 Dementia in other diseases classified elsewhere without behavioral disturbance: Secondary | ICD-10-CM

## 2024-01-11 NOTE — Telephone Encounter (Signed)
 Increase divalproex  to 500 mg tablet at bedtime and 250 mg tablet in the morning.

## 2024-01-11 NOTE — Telephone Encounter (Signed)
 Copied from CRM [id number]. Topic: Referral - Request for Referral >> Jan 11, 2024  8:43 AM Marda G wrote: Patents daughter calling in regards to resending an up to date referral for a Nephrology appt.   Provider:  Cathryne Nat Joshua [address] [place] [address] [phone]

## 2024-01-11 NOTE — Telephone Encounter (Signed)
 Copied from CRM #[id number]. Topic: Clinical - Prescription Issue >> Jan 11, 2024  8:28 AM [name] wrote: Daughter Michelle Rowe calling Reason for CRM:   Rexulti   was not approved for insurance to cover and out of pocket is $1600. The medicaid representative stated they are other medications that she can have that are covered or give a reason why this medication is needed.   Recommended: Aripiprazole   (no authorization needed)   Daughter is asking for medication in the mean time for her to rest at night.

## 2024-01-11 NOTE — Telephone Encounter (Signed)
 Outgoing call placed to patient, no answer, and no voicemail. We will need to make additional attempts to reach patient.    OK for E2C2 to ask question Reason for call: Question if patient was ever seen by nephrology when we placed referral in August?

## 2024-01-11 NOTE — Telephone Encounter (Signed)
 1.) Outgoing call placed to patients daughter, no answer, and no voicemail. We will need to make additional attempts to reach patient.   2.) If Michelle Rowe is in agreement with providers recommendation a new rx will need to be sent to the pharmacy with instructions below

## 2024-01-12 ENCOUNTER — Other Ambulatory Visit: Payer: Self-pay | Admitting: Family

## 2024-01-12 DIAGNOSIS — F0280 Dementia in other diseases classified elsewhere without behavioral disturbance: Secondary | ICD-10-CM

## 2024-01-12 MED ORDER — ARIPIPRAZOLE 2 MG PO TABS: 2.0000 mg | ORAL_TABLET | Freq: Every day | ORAL | 3 refills | Status: DC

## 2024-01-12 MED ORDER — ARIPIPRAZOLE 2 MG PO TABS: 2.0000 mg | ORAL_TABLET | Freq: Every day | ORAL | 3 refills | Status: AC

## 2024-01-12 NOTE — Telephone Encounter (Signed)
 Spoke with patient to give the response from Ngetich, [name] C, NP she understand recommendation  for the medication and agree for the medication increase.  Patient daughter wanted to asked Ngetich, [name] C, NP if she can send a medication for aripiprazole because it does not need a prior authorization and she stated that she is very concerned about her behavior at the Adult Day Care that she is going to be kicked out of the Day Care   Message sent to Ngetich, [name] C, NP

## 2024-01-12 NOTE — Progress Notes (Signed)
 Prescription sent

## 2024-01-12 NOTE — Telephone Encounter (Signed)
 Abilify 2 mg prescription send to pharmacy.

## 2024-01-13 NOTE — Telephone Encounter (Signed)
 Patient daughter has been notified and has no questions or concerns

## 2024-01-19 ENCOUNTER — Emergency Department (HOSPITAL_COMMUNITY)

## 2024-01-19 ENCOUNTER — Other Ambulatory Visit: Payer: Self-pay

## 2024-01-19 ENCOUNTER — Observation Stay (HOSPITAL_COMMUNITY)
Admission: EM | Admit: 2024-01-19 | Discharge: 2024-01-21 | Disposition: A | Attending: Emergency Medicine | Admitting: Emergency Medicine

## 2024-01-19 ENCOUNTER — Encounter (HOSPITAL_COMMUNITY): Payer: Self-pay

## 2024-01-19 ENCOUNTER — Ambulatory Visit: Payer: Self-pay

## 2024-01-19 DIAGNOSIS — E1122 Type 2 diabetes mellitus with diabetic chronic kidney disease: Secondary | ICD-10-CM | POA: Diagnosis present

## 2024-01-19 DIAGNOSIS — Z79899 Other long term (current) drug therapy: Secondary | ICD-10-CM | POA: Insufficient documentation

## 2024-01-19 DIAGNOSIS — M1A9XX0 Chronic gout, unspecified, without tophus (tophi): Secondary | ICD-10-CM | POA: Diagnosis present

## 2024-01-19 DIAGNOSIS — I129 Hypertensive chronic kidney disease with stage 1 through stage 4 chronic kidney disease, or unspecified chronic kidney disease: Secondary | ICD-10-CM | POA: Insufficient documentation

## 2024-01-19 DIAGNOSIS — Z794 Long term (current) use of insulin: Secondary | ICD-10-CM | POA: Insufficient documentation

## 2024-01-19 DIAGNOSIS — Z6839 Body mass index (BMI) 39.0-39.9, adult: Secondary | ICD-10-CM | POA: Insufficient documentation

## 2024-01-19 DIAGNOSIS — R296 Repeated falls: Secondary | ICD-10-CM | POA: Insufficient documentation

## 2024-01-19 DIAGNOSIS — M1990 Unspecified osteoarthritis, unspecified site: Secondary | ICD-10-CM | POA: Insufficient documentation

## 2024-01-19 DIAGNOSIS — Z7982 Long term (current) use of aspirin: Secondary | ICD-10-CM | POA: Insufficient documentation

## 2024-01-19 DIAGNOSIS — M109 Gout, unspecified: Secondary | ICD-10-CM | POA: Insufficient documentation

## 2024-01-19 DIAGNOSIS — F0280 Dementia in other diseases classified elsewhere without behavioral disturbance: Secondary | ICD-10-CM | POA: Diagnosis present

## 2024-01-19 DIAGNOSIS — W19XXXA Unspecified fall, initial encounter: Secondary | ICD-10-CM

## 2024-01-19 DIAGNOSIS — N1832 Chronic kidney disease, stage 3b: Secondary | ICD-10-CM | POA: Insufficient documentation

## 2024-01-19 DIAGNOSIS — I10 Essential (primary) hypertension: Secondary | ICD-10-CM | POA: Diagnosis present

## 2024-01-19 LAB — COMPREHENSIVE METABOLIC PANEL WITH GFR
ALT: 17 U/L (ref 0–44)
AST: 31 U/L (ref 15–41)
Albumin: 3.1 g/dL — ABNORMAL LOW (ref 3.5–5.0)
Alkaline Phosphatase: 56 U/L (ref 38–126)
Anion gap: 11 (ref 5–15)
BUN: 27 mg/dL — ABNORMAL HIGH (ref 8–23)
CO2: 22 mmol/L (ref 22–32)
Calcium: 8.9 mg/dL (ref 8.9–10.3)
Chloride: 108 mmol/L (ref 98–111)
Creatinine, Ser: 1.61 mg/dL — ABNORMAL HIGH (ref 0.44–1.00)
GFR, Estimated: 31 mL/min — ABNORMAL LOW (ref >60)
Glucose, Bld: 144 mg/dL — ABNORMAL HIGH (ref 70–99)
Potassium: 4.1 mmol/L (ref 3.5–5.1)
Sodium: 141 mmol/L (ref 135–145)
Total Bilirubin: 0.6 mg/dL (ref 0.0–1.2)
Total Protein: 7.4 g/dL (ref 6.5–8.1)

## 2024-01-19 LAB — URINALYSIS, W/ REFLEX TO CULTURE (INFECTION SUSPECTED)
Bilirubin Urine: NEGATIVE
Glucose, UA: NEGATIVE mg/dL
Hgb urine dipstick: NEGATIVE
Ketones, ur: NEGATIVE mg/dL
Nitrite: NEGATIVE
Protein, ur: NEGATIVE mg/dL
Specific Gravity, Urine: 1.02 (ref 1.005–1.030)
pH: 5 (ref 5.0–8.0)

## 2024-01-19 LAB — VALPROIC ACID LEVEL: Valproic Acid Lvl: 35 ug/mL — ABNORMAL LOW (ref 50–100)

## 2024-01-19 LAB — CBC WITH DIFFERENTIAL/PLATELET
Abs Immature Granulocytes: 0.01 K/uL (ref 0.00–0.07)
Basophils Absolute: 0 K/uL (ref 0.0–0.1)
Basophils Relative: 0 %
Eosinophils Absolute: 0 K/uL (ref 0.0–0.5)
Eosinophils Relative: 0 %
HCT: 38.5 % (ref 36.0–46.0)
Hemoglobin: 12.1 g/dL (ref 12.0–15.0)
Immature Granulocytes: 0 %
Lymphocytes Relative: 31 %
Lymphs Abs: 1.9 K/uL (ref 0.7–4.0)
MCH: 31 pg (ref 26.0–34.0)
MCHC: 31.4 g/dL (ref 30.0–36.0)
MCV: 98.7 fL (ref 80.0–100.0)
Monocytes Absolute: 0.6 K/uL (ref 0.1–1.0)
Monocytes Relative: 10 %
Neutro Abs: 3.7 K/uL (ref 1.7–7.7)
Neutrophils Relative %: 59 %
Platelets: 299 K/uL (ref 150–400)
RBC: 3.9 MIL/uL (ref 3.87–5.11)
RDW: 13 % (ref 11.5–15.5)
WBC: 6.2 K/uL (ref 4.0–10.5)
nRBC: 0 % (ref 0.0–0.2)

## 2024-01-19 LAB — CK
Total CK: 619 U/L — ABNORMAL HIGH (ref 38–234)
Total CK: 652 U/L — ABNORMAL HIGH (ref 38–234)

## 2024-01-19 LAB — CBG MONITORING, ED: Glucose-Capillary: 165 mg/dL — ABNORMAL HIGH (ref 70–99)

## 2024-01-19 MED ORDER — SODIUM CHLORIDE 0.9 % IV BOLUS
500.0000 mL | Freq: Once | INTRAVENOUS | Status: AC
  Administered 2024-01-19: 500 mL via INTRAVENOUS

## 2024-01-19 MED ORDER — LORAZEPAM 2 MG/ML IJ SOLN
1.0000 mg | Freq: Once | INTRAMUSCULAR | Status: AC
  Administered 2024-01-19: 1 mg via INTRAVENOUS
  Filled 2024-01-19: qty 1

## 2024-01-19 NOTE — ED Provider Notes (Signed)
 [place] EMERGENCY DEPARTMENT AT [facility] Provider Note   CSN: [id number] Arrival date & time: 01/19/24  1106     Patient presents with: Michelle Rowe is a 83 y.o. female.    Fall  Patient with history of severe dementia.  History comes from patient's daughter.  Reportedly had a fall last night.  Had been laying on the floor most of the night.  May have hit her head.  Has had chronic knee pain.  However also is recently complaining of abdominal pain.  Cannot really provide much history.  Has had worsening mental status with some mental status changes.  Has had recent Abilify  being started.  Also is on Depakote  and appears to have had some change in doses recently.     Prior to Admission medications  Medication Sig Start Date End Date Taking? Authorizing Provider  allopurinol  (ZYLOPRIM ) 100 MG tablet Take 1 tablet (100 mg total) by mouth daily. 10/26/23   Ngetich, [name] C, NP  amLODipine  (NORVASC ) 10 MG tablet Take 1 tablet (10 mg total) by mouth daily. Needs an appointment before anymore future refills. 10/26/23   Ngetich, [name] C, NP  ARIPiprazole  (ABILIFY ) 2 MG tablet Take 1 tablet (2 mg total) by mouth daily. 01/12/24   Ngetich, [name] C, NP  aspirin  81 MG chewable tablet Chew 1 tablet (81 mg total) by mouth daily. 10/26/23   Ngetich, [name] C, NP  azelastine  (ASTELIN ) 0.1 % nasal spray Place 1 spray into both nostrils 2 (two) times daily. Use in each nostril as directed 10/26/23   Ngetich, [name] C, NP  Blood Glucose Monitoring Suppl DEVI 1 each by Does not apply route in the morning, at noon, and at bedtime. May substitute to any manufacturer covered by patient's insurance. 02/18/23   Ngetich, [name] C, NP  divalproex  (DEPAKOTE ) 250 MG DR tablet Take 1 tablet (250 mg total) by mouth 2 (two) times daily. 11/17/23   Ngetich, [name] C, NP  EPINEPHrine  (EPIPEN  2-PAK) 0.3 mg/0.3 mL IJ SOAJ injection Inject 0.3 mg into the muscle as needed for anaphylaxis. 02/18/23   Ngetich,  [name] C, NP  fluticasone  (FLONASE ) 50 MCG/ACT nasal spray USE 1 SPRAY(S) IN EACH NOSTRIL TWICE DAILY AS NEEDED FOR ALLERGIES 10/26/23   Ngetich, [name] C, NP  glipiZIDE  (GLUCOTROL ) 5 MG tablet Take 1 tablet (5 mg total) by mouth daily. 10/26/23   Ngetich, [name] C, NP  glucose blood test strip 1 each by Other route as needed for other. Use as instructed 10/26/23   Ngetich, [name] C, NP  guaiFENesin  (MUCINEX ) 600 MG 12 hr tablet Take 1 tablet (600 mg total) by mouth 2 (two) times daily as needed. 10/26/23   Ngetich, [name] C, NP  hydrALAZINE  (APRESOLINE ) 25 MG tablet Take 1 tablet (25 mg total) by mouth 3 (three) times daily. 10/26/23   Ngetich, [name] C, NP  ipratropium (ATROVENT ) 0.06 % nasal spray Place 2 sprays into both nostrils 4 (four) times daily. 10/26/23   Ngetich, [name] C, NP  Lancets Ultra Thin 30G MISC Inject 1 each into the skin daily. 10/26/23   Ngetich, [name] C, NP  loratadine  (CLARITIN ) 10 MG tablet Take 1 tablet (10 mg total) by mouth daily as needed for allergies. 10/26/23   Ngetich, [name] C, NP  LORazepam  (ATIVAN ) 0.5 MG tablet Take 1 tablet (0.5 mg total) by mouth 2 (two) times daily as needed. for anxiety 10/26/23   Ngetich, [name] C, NP  losartan  (COZAAR ) 50 MG tablet Take  1 tablet (50 mg total) by mouth daily. 10/26/23   Ngetich, [name] C, NP  nystatin  cream (MYCOSTATIN ) Apply 1 Application topically 2 (two) times daily. 10/26/23   Ngetich, [name] C, NP  simvastatin  (ZOCOR ) 10 MG tablet Take 1 tablet (10 mg total) by mouth at bedtime. 10/26/23   Ngetich, [name] C, NP    Allergies: Augmentin [amoxicillin-pot clavulanate], Amoxicillin, and Shellfish allergy     Review of Systems  Updated Vital Signs BP 129/72 (BP Location: Right Arm)   Pulse 60   Temp 98 F (36.7 C) (Oral)   Resp 16   Ht 5' 2 (1.575 m)   Wt 101.6 kg   SpO2 100%   BMI 40.97 kg/m   Physical Exam Vitals and nursing note reviewed.  HENT:     Head: Atraumatic.  Abdominal:     Tenderness: There is abdominal tenderness.      Comments: May have some mild diffuse tenderness.  Musculoskeletal:     Cervical back: Neck supple. No tenderness.     Comments: Does have some mild tenderness over knees.  No specific hip tenderness.  No lumbar tenderness.  Neurological:     Mental Status: She is alert. Mental status is at baseline.     (all labs ordered are listed, but only abnormal results are displayed) Labs Reviewed  COMPREHENSIVE METABOLIC PANEL WITH GFR - Abnormal; Notable for the following components:      Result Value   Glucose, Bld 144 (*)    BUN 27 (*)    Creatinine, Ser 1.61 (*)    Albumin 3.1 (*)    GFR, Estimated 31 (*)    All other components within normal limits  CK - Abnormal; Notable for the following components:   Total CK 619 (*)    All other components within normal limits  CBC WITH DIFFERENTIAL/PLATELET  URINALYSIS, W/ REFLEX TO CULTURE (INFECTION SUSPECTED)  CK  VALPROIC ACID  LEVEL    EKG: None  Radiology: DG Knee Complete 4 Views Right Result Date: 01/19/2024 CLINICAL DATA:  Pain. EXAM: RIGHT KNEE - COMPLETE 4+ VIEW COMPARISON:  06/18/2021. FINDINGS: No acute fracture or dislocation. Severe lateral compartment osteoarthritis with joint space narrowing and osteophytosis. Moderate patellofemoral and medial compartment osteoarthritis. Small joint effusion. IMPRESSION: 1. No acute osseous abnormality. 2. Moderate to severe tricompartmental osteoarthritis, most pronounced in the lateral femorotibial compartment. Electronically Signed   By: Harrietta Sherry M.D.   On: 01/19/2024 13:39   CT Cervical Spine Wo Contrast Result Date: 01/19/2024 EXAM: CT Cervical Spine Without Intravenous Contrast. CLINICAL HISTORY: 83 year old female status post fall, found down on floor. Neck trauma. TECHNIQUE: Axial computed tomography images of the cervical spine without intravenous contrast. Sagittal and coronal reformations performed. Dose reduction technique was used including one or more of the following:  automated exposure control, adjustment of mA and kV according to patient size, and/or iterative reconstruction. COMPARISON: CT head 01/19/2024 12:48:07 PM reported separately. FINDINGS: BONES: Straightening and mild reversal of the normal cervical lordosis. No acute fracture or focal osseous lesion. Degenerative facet ankylosis on the right at C2-C3. Normal bone mineralization for age. Flowing anterior endplate osteophytes in the lower cervical spine. No other levels of ankylosis identified. DISCS / DEGENERATIVE CHANGES: Partially calcified degenerative ligamentous hypertrophy about the odontoid. Chronic cervical spine degeneration superimposed on degenerative C2-C3 facet ankylosis. Cervical disc degeneration appears most pronounced at C4-C5, with a partially calcified midline disc protrusion there. No significant spinal stenosis by CT. SOFT TISSUES: No prevertebral soft tissue swelling. Retropharyngeal course  of the carotid arteries in the neck with mild calcified atherosclerosis. Otherwise negative visible non-contrast neck soft tissues. Negative visible non-contrast thoracic inlet. IMPRESSION: 1. No acute traumatic injury identified in the cervical spine. 2. Chronic cervical spine degeneration superimposed on degenerative C2 C3 facet ankylosis. Electronically signed by: Helayne Hurst MD 01/19/2024 01:03 PM EST RP Workstation: [id number]   CT Head Wo Contrast Result Date: 01/19/2024 EXAM: CT Head Without Intravenous Contrast. CLINICAL HISTORY: 83 year old female with minor head trauma, found down on the floor this morning. TECHNIQUE: Axial computed tomography images of the head/brain without intravenous contrast. Dose reduction technique was used including one or more of the following: automated exposure control, adjustment of mA and kV according to patient size, and/or iterative reconstruction. CONTRAST: Without. COMPARISON: None. FINDINGS: BRAIN: Brain volume within normal limits for age. Mild for age  periventricular white matter hypodensity. No acute intraparenchymal hemorrhage. No mass lesion. No CT evidence for acute territorial infarct. No midline shift or extra-axial collection. Calcified atherosclerosis at the skull base. No suspicious intracranial vascular hyperdensity. VENTRICLES: No hydrocephalus. SINUSES AND MASTOIDS: The paranasal sinuses, tympanic cavities, and mastoids are well aerated. SOFT TISSUES: No acute orbital or scalp soft tissue injury identified. No radiopaque foreign body is seen. BONES: No acute skull fracture. IMPRESSION: 1. No acute traumatic injury identified. 2. Normal for age non-contrast CT appearance of the brain. Electronically signed by: Helayne Hurst MD 01/19/2024 01:00 PM EST RP Workstation: [id number]     Procedures   Medications Ordered in the ED  sodium chloride  0.9 % bolus 500 mL (has no administration in time range)                                    Medical Decision Making Amount and/or Complexity of Data Reviewed Labs: ordered. Radiology: ordered.   Patient with fall.  Had been complaining some knee pain.  No hip tenderness.  No lumbar tenderness.  However does have some abdominal tenderness.  This is apparently been going for a while now.  Will get CT scan to further evaluate.  CK mildly elevated at 600.  Rhabdomyolitis is considered.  Will recheck.  Will give fluid bolus.  Will also check urinalysis and Depakote  level.  If negative hopefully should be able to discharge home.  Asked to see patient again.  May have had some more confusion.  How did over does answer questions.  Has eaten.  Moving all extremities.  Appears to be mostly at baseline now.  Care will be turned over to Dr. Jerral       Final diagnoses:  None    ED Discharge Orders     None          Patsey Lot, MD 01/19/24 2245

## 2024-01-19 NOTE — ED Provider Notes (Signed)
 Care of patient received from prior provider at 10:59 PM, please see their note for complete H/P and care plan.  Received handoff per ED course.  Clinical Course as of 01/20/24 0456  Thu Jan 19, 2024  2258 Stable 83YOF with a chief complaint of fall.  Found down this AM. Down all night CK up s/p Ivf  Ctap and UA pending [CC]  2359 CBG monitoring, ED(!) [CC]  Fri Jan 20, 2024  0100 WBC: 6.2 [CC]    Clinical Course User Index [CC] Jerral Meth, MD    Reassessment: Family states that since she started the medication for anxiety/behavioral concerns recently and they have had difficulties with her being relatively lethargic throughout the week this week. This may be the etiology of her fall event last night and while she was too weak to get up on her own. Concern for polypharmacy/worsening weakness.  Patient discussed with hospitalist and arrange for admission for further care and management.  Traumatic workup ordered per prior team negative for any acute pathology. Disposition:   Based on the above findings, I believe this patient is stable for admission.    Patient/family educated about specific findings on our evaluation and explained exact reasons for admission.  Patient/family educated about clinical situation and time was allowed to answer questions.   Admission team communicated with and agreed with need for admission. Patient admitted. Patient  ready to move at this time.     Emergency Department Medication Summary:   Medications  sodium chloride  0.9 % bolus 500 mL (0 mLs Intravenous Stopped 01/19/24 2321)  LORazepam  (ATIVAN ) injection 1 mg (1 mg Intravenous Given 01/19/24 2354)  iohexol (OMNIPAQUE) 350 MG/ML injection 75 mL (75 mLs Intravenous Contrast Given 01/20/24 0029)  lactated ringers bolus 500 mL (0 mLs Intravenous Stopped 01/20/24 0259)            Jerral Meth, MD 01/20/24 [phone]

## 2024-01-19 NOTE — Telephone Encounter (Signed)
 FYI Only or Action Required?: FYI only for provider: Patient's daughter taking her to hospital.  Patient was last seen in primary care on 11/17/2023 by Ngetich, Roxan BROCKS, NP.  Called Nurse Triage reporting Fall.  Symptoms began yesterday.  Interventions attempted: Nothing.  Symptoms are: stable.  Triage Disposition: See HCP Within 4 Hours (Or PCP Triage)  Patient/caregiver understands and will follow disposition?: Yes Reason for Disposition  [1] MODERATE weakness (e.g., interferes with work, school, normal activities) AND [2] new-onset or getting worse  Answer Assessment - Initial Assessment Questions Patient's daughter Sharlet calling in today. Patient's daughter is unsure how patient fell or if she got on the ground and was not able to get back up. After granddaughter came to help get patient up. Patient's daughter looking to see Roxan today and get xray orders, PCP next available is not until tomorrow and patient should be seen today, daughter agreeable. Advised the next available is at 220 today and patient's daughter declined and said she will take her to [place].   1. MECHANISM: How did the fall happen?     Unsure  2. DOMESTIC VIOLENCE AND ELDER ABUSE SCREENING: Did you fall because someone pushed you or tried to hurt you? If Yes, ask: Are you safe now?     N/a  3. ONSET: When did the fall happen? (e.g., minutes, hours, or days ago)     Last night  4. LOCATION: What part of the body hit the ground? (e.g., back, buttocks, head, hips, knees, hands, head, stomach)     Unsure  5. INJURY: Did you hurt (injure) yourself when you fell? If Yes, ask: What did you injure? Tell me more about this? (e.g., body area; type of injury; pain severity)     Unsure  6. PAIN: Is there any pain? If Yes, ask: How bad is the pain? (e.g., Scale 0-10; or none, mild,      Patient reports no pain, but patient has trouble with R knee already  7. SIZE: For cuts, bruises, or  swelling, ask: How large is it? (e.g., inches or centimeters)      Denies  8. OTHER SYMPTOMS: Do you have any other symptoms? (e.g., dizziness, fever, weakness; new-onset or worsening).      Unsure  Protocols used: Falls and [facility]  Copied from CRM #[id number]. Topic: Clinical - Red Word Triage >> Jan 19, 2024  8:52 AM Susanna ORN wrote: Red Word that prompted transfer to Nurse Triage: Sharlet Agent, daughter of patient, called in stating that her mom fell this morning. States that she seems to be ok after they got her up. Not sure if she's bruised but wants to make sure that she's ok. Would like to see [name] Ngetich today, if possible.

## 2024-01-19 NOTE — ED Provider Triage Note (Signed)
 Emergency Medicine Provider Triage Evaluation Note  ARIYONNA TWICHELL , a 83 y.o. female  was evaluated in triage.  Pt complains of Found on floor this morning, per daughter, feels she was there overnight. Unknown if she fell or simply laid down on the floor. No vomiting. Alzheimer's, at baseline mental status.  Review of Systems  Positive:  Negative:   Physical Exam  BP 101/67 (BP Location: Left Arm)   Pulse (!) 54   Temp 97.7 F (36.5 C)   Resp 16   Ht 5' 2 (1.575 m)   Wt 101.6 kg   SpO2 100%   BMI 40.97 kg/m  Gen:   Awake, no distress   Resp:  Normal effort  MSK:   Moves extremities without difficulty  Other:    Medical Decision Making  Medically screening exam initiated at 12:25 PM.  Appropriate orders placed.  BRITANI BEATTIE was informed that the remainder of the evaluation will be completed by another provider, this initial triage assessment does not replace that evaluation, and the importance of remaining in the ED until their evaluation is complete.     Odell Balls, PA-C 01/19/24 1226

## 2024-01-19 NOTE — ED Notes (Addendum)
 Patient has been cleaned, and adjusted in bed. Patient is in the bed resting. Patient has been provided a sandwich bag bedside.

## 2024-01-19 NOTE — ED Triage Notes (Signed)
 Pt's daughter reports finding Pt on the floor this morning.  Sts she think she hit her head because a flower pot had been knocked over.  Pt has advanced dementia, but has not pinpointed any painful areas.  Daughter reports Pt needs a R knee replacement.     Pt's daughter reports bathing Pt this morning and didn't notice any lacerations, abrasions, or bruising.

## 2024-01-19 NOTE — Telephone Encounter (Signed)
 Noted

## 2024-01-20 ENCOUNTER — Emergency Department (HOSPITAL_COMMUNITY)

## 2024-01-20 ENCOUNTER — Encounter (HOSPITAL_COMMUNITY): Payer: Self-pay | Admitting: Internal Medicine

## 2024-01-20 ENCOUNTER — Ambulatory Visit: Admitting: Adult Health

## 2024-01-20 DIAGNOSIS — F03918 Unspecified dementia, unspecified severity, with other behavioral disturbance: Secondary | ICD-10-CM | POA: Insufficient documentation

## 2024-01-20 DIAGNOSIS — M6282 Rhabdomyolysis: Principal | ICD-10-CM | POA: Insufficient documentation

## 2024-01-20 DIAGNOSIS — W19XXXA Unspecified fall, initial encounter: Secondary | ICD-10-CM

## 2024-01-20 DIAGNOSIS — F0390 Unspecified dementia without behavioral disturbance: Secondary | ICD-10-CM | POA: Diagnosis present

## 2024-01-20 LAB — HEPATIC FUNCTION PANEL
ALT: 12 U/L (ref 0–44)
AST: 25 U/L (ref 15–41)
Albumin: 2.7 g/dL — ABNORMAL LOW (ref 3.5–5.0)
Alkaline Phosphatase: 51 U/L (ref 38–126)
Bilirubin, Direct: 0.2 mg/dL (ref 0.0–0.2)
Indirect Bilirubin: 0.5 mg/dL (ref 0.3–0.9)
Total Bilirubin: 0.7 mg/dL (ref 0.0–1.2)
Total Protein: 6.2 g/dL — ABNORMAL LOW (ref 6.5–8.1)

## 2024-01-20 LAB — CBC WITH DIFFERENTIAL/PLATELET
Abs Immature Granulocytes: 0.02 K/uL (ref 0.00–0.07)
Basophils Absolute: 0 K/uL (ref 0.0–0.1)
Basophils Relative: 0 %
Eosinophils Absolute: 0 K/uL (ref 0.0–0.5)
Eosinophils Relative: 0 %
HCT: 38.1 % (ref 36.0–46.0)
Hemoglobin: 11.6 g/dL — ABNORMAL LOW (ref 12.0–15.0)
Immature Granulocytes: 0 %
Lymphocytes Relative: 31 %
Lymphs Abs: 1.9 K/uL (ref 0.7–4.0)
MCH: 31.7 pg (ref 26.0–34.0)
MCHC: 30.4 g/dL (ref 30.0–36.0)
MCV: 104.1 fL — ABNORMAL HIGH (ref 80.0–100.0)
Monocytes Absolute: 0.6 K/uL (ref 0.1–1.0)
Monocytes Relative: 10 %
Neutro Abs: 3.6 K/uL (ref 1.7–7.7)
Neutrophils Relative %: 59 %
Platelets: 262 K/uL (ref 150–400)
RBC: 3.66 MIL/uL — ABNORMAL LOW (ref 3.87–5.11)
RDW: 13 % (ref 11.5–15.5)
WBC: 6.1 K/uL (ref 4.0–10.5)
nRBC: 0 % (ref 0.0–0.2)

## 2024-01-20 LAB — BASIC METABOLIC PANEL WITH GFR
Anion gap: 10 (ref 5–15)
BUN: 28 mg/dL — ABNORMAL HIGH (ref 8–23)
CO2: 21 mmol/L — ABNORMAL LOW (ref 22–32)
Calcium: 8.5 mg/dL — ABNORMAL LOW (ref 8.9–10.3)
Chloride: 108 mmol/L (ref 98–111)
Creatinine, Ser: 1.51 mg/dL — ABNORMAL HIGH (ref 0.44–1.00)
GFR, Estimated: 34 mL/min — ABNORMAL LOW (ref >60)
Glucose, Bld: 134 mg/dL — ABNORMAL HIGH (ref 70–99)
Potassium: 5 mmol/L (ref 3.5–5.1)
Sodium: 139 mmol/L (ref 135–145)

## 2024-01-20 LAB — TSH: TSH: 1.814 u[IU]/mL (ref 0.350–4.500)

## 2024-01-20 LAB — CK: Total CK: 519 U/L — ABNORMAL HIGH (ref 38–234)

## 2024-01-20 LAB — GLUCOSE, CAPILLARY
Glucose-Capillary: 198 mg/dL — ABNORMAL HIGH (ref 70–99)
Glucose-Capillary: 124 mg/dL — ABNORMAL HIGH (ref 70–99)

## 2024-01-20 LAB — CBG MONITORING, ED
Glucose-Capillary: 106 mg/dL — ABNORMAL HIGH (ref 70–99)
Glucose-Capillary: 169 mg/dL — ABNORMAL HIGH (ref 70–99)

## 2024-01-20 MED ORDER — IOHEXOL 350 MG/ML SOLN
75.0000 mL | Freq: Once | INTRAVENOUS | Status: AC | PRN
  Administered 2024-01-20: 75 mL via INTRAVENOUS

## 2024-01-20 MED ORDER — LOSARTAN POTASSIUM 50 MG PO TABS
50.0000 mg | ORAL_TABLET | Freq: Every day | ORAL | Status: DC
  Administered 2024-01-20 – 2024-01-21 (×2): 50 mg via ORAL
  Filled 2024-01-20 (×2): qty 1

## 2024-01-20 MED ORDER — LORAZEPAM 0.5 MG PO TABS
0.5000 mg | ORAL_TABLET | Freq: Two times a day (BID) | ORAL | Status: DC | PRN
  Administered 2024-01-20: 0.5 mg via ORAL
  Filled 2024-01-20: qty 1

## 2024-01-20 MED ORDER — INSULIN ASPART 100 UNIT/ML IJ SOLN
0.0000 [IU] | Freq: Three times a day (TID) | INTRAMUSCULAR | Status: DC
  Administered 2024-01-20 (×2): 2 [IU] via SUBCUTANEOUS
  Filled 2024-01-20 (×2): qty 2

## 2024-01-20 MED ORDER — ACETAMINOPHEN 650 MG RE SUPP: 650.0000 mg | Freq: Four times a day (QID) | RECTAL | Status: DC | PRN

## 2024-01-20 MED ORDER — LACTATED RINGERS IV BOLUS
500.0000 mL | Freq: Once | INTRAVENOUS | Status: AC
  Administered 2024-01-20: 500 mL via INTRAVENOUS

## 2024-01-20 MED ORDER — AMLODIPINE BESYLATE 10 MG PO TABS
10.0000 mg | ORAL_TABLET | Freq: Every day | ORAL | Status: DC
  Administered 2024-01-20 – 2024-01-21 (×2): 10 mg via ORAL
  Filled 2024-01-20: qty 1
  Filled 2024-01-20: qty 2

## 2024-01-20 MED ORDER — HEPARIN SODIUM (PORCINE) 5000 UNIT/ML IJ SOLN
5000.0000 [IU] | Freq: Three times a day (TID) | INTRAMUSCULAR | Status: DC
  Administered 2024-01-20 – 2024-01-21 (×3): 5000 [IU] via SUBCUTANEOUS
  Filled 2024-01-20 (×3): qty 1

## 2024-01-20 MED ORDER — ACETAMINOPHEN 325 MG PO TABS: 650.0000 mg | ORAL_TABLET | Freq: Four times a day (QID) | ORAL | Status: DC | PRN

## 2024-01-20 MED ORDER — ARIPIPRAZOLE 2 MG PO TABS
2.0000 mg | ORAL_TABLET | Freq: Every day | ORAL | Status: DC
  Administered 2024-01-20 – 2024-01-21 (×2): 2 mg via ORAL
  Filled 2024-01-20 (×2): qty 1

## 2024-01-20 MED ADMIN — Allopurinol Tab 100 MG: 100 mg | ORAL | NDC 29300034901

## 2024-01-20 MED ADMIN — Aspirin Chew Tab 81 MG: 81 mg | ORAL | NDC 00904679480

## 2024-01-20 MED ADMIN — Divalproex Sodium Tab Delayed Release 250 MG: 250 mg | ORAL | NDC 00904686061

## 2024-01-20 MED ADMIN — Simvastatin Tab 20 MG: 10 mg | ORAL | NDC 16714068302

## 2024-01-20 MED ADMIN — Hydralazine HCl Tab 25 MG: 25 mg | ORAL | NDC 60687082211

## 2024-01-20 MED ADMIN — Hydralazine HCl Tab 25 MG: 25 mg | ORAL | NDC 23155083301

## 2024-01-20 MED ADMIN — Hydralazine HCl Tab 25 MG: 25 mg | ORAL | NDC 00904644161

## 2024-01-20 MED FILL — Allopurinol Tab 100 MG: 100.0000 mg | ORAL | Qty: 1 | Status: AC

## 2024-01-20 MED FILL — Divalproex Sodium Tab Delayed Release 250 MG: 250.0000 mg | ORAL | Qty: 1 | Status: AC

## 2024-01-20 MED FILL — Aspirin Chew Tab 81 MG: 81.0000 mg | ORAL | Qty: 1 | Status: AC

## 2024-01-20 MED FILL — Hydralazine HCl Tab 25 MG: 25.0000 mg | ORAL | Qty: 1 | Status: AC

## 2024-01-20 MED FILL — Simvastatin Tab 20 MG: 10.0000 mg | ORAL | Qty: 1 | Status: CN

## 2024-01-20 MED FILL — Simvastatin Tab 20 MG: 10.0000 mg | ORAL | Qty: 1 | Status: AC

## 2024-01-20 NOTE — Evaluation (Signed)
 Occupational Therapy Evaluation and Discharge Patient Details Name: Michelle Rowe MRN: [id number] DOB: May 30, 1940 Today's Date: 01/20/2024   History of Present Illness   Pt is 83 yo presenting [facility] due to a fall. PMH: dementia, DM II, HTN, CKD III.     Clinical Impressions Pt typically walks without AD and is able to self feed and wash her face. She is otherwise dependent in ADLs and IADLs. She lives with her supportive daughter and goes to adult daycare 5 days a week. Pt requires multimodal cues to follow simple commands. She needs moderate assistance for bed mobility and hand held assist for ambulation +2 safety/lines in the ED environment. Pt is near her baseline. No OT needs.      If plan is discharge home, recommend the following:   A little help with walking and/or transfers;A lot of help with bathing/dressing/bathroom;Assistance with cooking/housework;Assistance with feeding;Direct supervision/assist for medications management;Direct supervision/assist for financial management;Assist for transportation;Help with stairs or ramp for entrance     Functional Status Assessment   Patient has not had a recent decline in their functional status     Equipment Recommendations   None recommended by OT     Recommendations for Other Services         Precautions/Restrictions   Precautions Precautions: Fall Recall of Precautions/Restrictions: Impaired     Mobility Bed Mobility Overal bed mobility: Needs Assistance Bed Mobility: Supine to Sit, Sit to Supine     Supine to sit: Mod assist, +2 for safety/equipment Sit to supine: Mod assist, +2 for safety/equipment   General bed mobility comments: multimodal cues to initiate,increased time, assist for LEs over edge of stretcher and to raise trunk, assist to guide trunk and for LEs back into bed    Transfers Overall transfer level: Needs assistance Equipment used: 2 person hand held assist Transfers: Sit  to/from Stand Sit to Stand: +2 physical assistance, Min assist           General transfer comment: multimodal cues, B hand held assist      Balance Overall balance assessment: Needs assistance   Sitting balance-Leahy Scale: Fair     Standing balance support: Single extremity supported Standing balance-Leahy Scale: Fair                             ADL either performed or assessed with clinical judgement   ADL Overall ADL's : At baseline                                             Vision Patient Visual Report: No change from baseline       Perception Perception: Not tested       Praxis Praxis: Impaired Praxis Impairment Details: Initiation, Ideation, Ideomotor     Pertinent Vitals/Pain Pain Assessment Pain Assessment: Faces Faces Pain Scale: No hurt     Extremity/Trunk Assessment Upper Extremity Assessment Upper Extremity Assessment: Generalized weakness;Right hand dominant   Lower Extremity Assessment Lower Extremity Assessment: Defer to PT evaluation   Cervical / Trunk Assessment Cervical / Trunk Assessment: Other exceptions (weakness, obesity)   Communication Communication Communication: Impaired Factors Affecting Communication: Reduced clarity of speech (minimally verbal)   Cognition Arousal: Alert Behavior During Therapy: WFL for tasks assessed/performed Cognition: History of cognitive impairments  Following commands: Impaired Following commands impaired: Follows one step commands inconsistently (with multimodal cues)     Cueing  General Comments   Cueing Techniques: Verbal cues;Gestural cues;Tactile cues;Visual cues      Exercises     Shoulder Instructions      Home Living Family/patient expects to be discharged to:: Private residence Living Arrangements: Children Available Help at Discharge: Family;Available 24 hours/day Type of Home: Apartment Home Access:  Elevator     Home Layout: One level     Bathroom Shower/Tub: Chief Strategy Officer: Standard     Home Equipment: Toilet riser;Wheelchair - manual   Additional Comments: pt goes to adult daycare 5 days a week      Prior Functioning/Environment Prior Level of Function : Needs assist             Mobility Comments: walks without AD, increased time due to arthritic R knee ADLs Comments: self feeds,washes her face, otherwise total care    OT Problem List:     OT Treatment/Interventions:        OT Goals(Current goals can be found in the care plan section)       OT Frequency:       Co-evaluation              AM-PAC OT 6 Clicks Daily Activity     Outcome Measure Help from another person eating meals?: A Little Help from another person taking care of personal grooming?: A Lot Help from another person toileting, which includes using toliet, bedpan, or urinal?: Total Help from another person bathing (including washing, rinsing, drying)?: Total Help from another person to put on and taking off regular upper body clothing?: Total Help from another person to put on and taking off regular lower body clothing?: Total 6 Click Score: 9   End of Session Equipment Utilized During Treatment: Gait belt Nurse Communication: Mobility status  Activity Tolerance: Patient tolerated treatment well Patient left: in bed;with call bell/phone within reach;with family/visitor present  OT Visit Diagnosis: Unsteadiness on feet (R26.81);Other symptoms and signs involving cognitive function                Time: 8886-8841 OT Time Calculation (min): 45 min Charges:  OT General Charges $OT Visit: 1 Visit OT Evaluation $OT Eval Moderate Complexity: 1 Mod  Mliss HERO, OTR/L Acute Rehabilitation Services Office: [phone]   Kennth Mliss Helling 01/20/2024, 1:46 PM

## 2024-01-20 NOTE — Plan of Care (Signed)

## 2024-01-20 NOTE — Progress Notes (Signed)
 PROGRESS NOTE  SHAARON GOLLIDAY  DOB: May 06, 1940  PCP: Leonarda Roxan BROCKS, NP FMW:[id number]  DOA: 01/19/2024  LOS: 0 days  Hospital Day: 2  Subjective: Patient was seen and examined this morning.   Elderly African-American female.  Lying on bed. Answers with few words with eyes closed. Inconsistently follows commands. Not at bedside.  Brief narrative: Michelle Rowe is a 83 y.o. female with PMH significant for obesity, OSA, DM2, HTN, HLD, CHF, CKD, hyperparathyroidism, osteoarthritis, chronic back pain Patient lives at home Per family, patient has been having increasing agitation lately.  Also having difficulty ambulation  Last month, she was placed on Rexulti  which helped to improve agitation, but the insurance did not approve and hence she could not continue it more than 2 weeks.   Last week week she was started on Abilify , Depakote  dose was increased.  12/11, patient was brought to the ED after she was found on the floor. Daughter checked a security camera and noted that the previous night, patient tripped, lost her balance and fell on the floor, laid there all night only to be found by daughter next morning.  In the ED, patient was afebrile, hemodynamically stable CT head and CT C-spine were unremarkable Patient also complained of abdominal discomfort and hence CT abdomen was done which was unremarkable.   X-rays of the knee did not show any acute finding except osteoarthritic changes Blood work showed mildly elevated CK to 619, creatinine 1.6 at baseline UA unremarkable  Admitted to [facility] for further workup and management  Assessment and plan: Fall Mild rhabdomyolysis Had a mechanical fall, unable to get up from the floor overnight CK level elevated 619 Has been started on IV fluid CK level gradually improving Recent Labs  Lab 01/19/24 1238 01/19/24 2148 01/20/24 0634  CKTOTAL 619* 652* 519*   Dementia with behavioral symptoms Per family, last month, she was placed on  Rexulti  which helped to improve agitation, but the insurance did not approve and hence she could not continue it more than 2 weeks.   Last week week she was started on Abilify , Depakote  dose was increased. Admitting physician discussed with on-call neurology Dr. Lindzen who advised that since patient's medications Abilify  and Depakote  were recently introduced and also dose increased will continue at least for 2 more weeks before any changes made. Continue Abilify  2 mg daily, Depakote  250 mg twice daily, Ativan  0.5 mg twice daily as needed  Type 2 diabetes mellitus A1c 6.7 on September 2025 PTA meds-glipizide  5 mg daily, Continue SSI/Accu-Cheks Recent Labs  Lab 01/19/24 2140 01/20/24 0834 01/20/24 1301 01/20/24 1612  GLUCAP 165* 106* 169* 198*   Hypertension PTA meds- amlodipine  10 mg daily, hydralazine  25 mg 3 times daily, losartan  50 mg daily All were resumed Blood pressure stable Continue to monitor  HLD Continue aspirin , simvastatin   CKD 3B Creatinine remains at baseline Recent Labs    02/18/23 0954 07/31/23 2136 10/26/23 1050 01/19/24 1238 01/20/24 0634  BUN 24 35* 31* 27* 28*  CREATININE 1.26* 1.51* 1.66* 1.61* 1.51*  CO2 23 20* 21 22 21*   Osteoarthritis Gout Continue allopurinol , Pain regimen ---- PRN: Tylenol   Morbid Obesity  Body mass index is 39.64 kg/m. Patient has been advised to make an attempt to improve diet and exercise patterns to aid in weight loss.    Nutrition Status:         Mobility: PT ordered in  PT Orders: Active   PT Follow up Rec: No Pt Follow Up12/01/2024 1628  Goals of care   Code Status: Full Code     DVT prophylaxis:  heparin injection 5,000 Units Start: 01/20/24 1400   Antimicrobials: None Fluid: None Consultants: None Family Communication: None at bedside  Status: Observation Level of care:  Telemetry   Patient is from: Home Needs to continue in-hospital care: Repeat labs, renal function, mental  status Anticipated d/c to: Pending PT eval      Diet:  Diet Order             Diet heart healthy/carb modified Room service appropriate? Yes; Fluid consistency: Thin  Diet effective now                   Scheduled Meds:  allopurinol   100 mg Oral Daily   amLODipine   10 mg Oral Daily   ARIPiprazole   2 mg Oral Daily   aspirin   81 mg Oral Daily   divalproex   250 mg Oral BID   heparin  5,000 Units Subcutaneous Q8H   hydrALAZINE   25 mg Oral TID   insulin aspart  0-9 Units Subcutaneous TID WC   losartan   50 mg Oral Daily   simvastatin   10 mg Oral QHS    PRN meds: acetaminophen  **OR** acetaminophen , LORazepam    Infusions:    Antimicrobials: Anti-infectives (From admission, onward)    None       Objective: Vitals:   01/20/24 1336 01/20/24 1610  BP: 115/65 114/65  Pulse: 65 69  Resp: (!) 22 18  Temp: 98.4 F (36.9 C)   SpO2: 97% 100%    Intake/Output Summary (Last 24 hours) at 01/20/2024 1747 Last data filed at 01/20/2024 0259 Gross per 24 hour  Intake 1000 ml  Output --  Net 1000 ml   Filed Weights   01/19/24 1159 01/20/24 1610  Weight: 101.6 kg 98.3 kg   Weight change:  Body mass index is 39.64 kg/m.   Physical Exam: General exam: Pleasant, elderly African-American female Skin: No rashes, lesions or ulcers. HEENT: Atraumatic, normocephalic, no obvious bleeding Lungs: Clear to auscultation bilaterally,  CVS: S1, S2, no murmur,   GI/Abd: Soft, nontender, nondistended, bowel sound present,   CNS: Answers with few words with eyes closed.  Voice clear Extremities: No pedal edema, no calf tenderness,   Data Review: I have personally reviewed the laboratory data and studies available.  F/u labs ordered Unresulted Labs (From admission, onward)     Start     Ordered   01/21/24 0500  CBC with Differential/Platelet  Tomorrow morning,   R        01/20/24 1054   01/21/24 0500  CK  Tomorrow morning,   R        01/20/24 1054   01/21/24 0500   Comprehensive metabolic panel with GFR  Tomorrow morning,   R        01/20/24 1054   01/20/24 0508  CBC  (heparin)  Once,   R       Comments: Baseline for heparin therapy IF NOT ALREADY DRAWN.  Notify MD if PLT < 100 K.    01/20/24 0509            Signed, Chapman Rota, MD [facility] 01/20/2024

## 2024-01-20 NOTE — ED Notes (Signed)
 CCMD called.

## 2024-01-20 NOTE — Evaluation (Signed)
 Physical Therapy Evaluation Patient Details Name: Michelle Rowe MRN: [id number] DOB: Apr 04, 1940 Today's Date: 01/20/2024  History of Present Illness  Pt is 83 yo presenting to [facility] on 12/11 due to a fall. PMH: dementia, DM II, HTN, CKD III.  Clinical Impression  Pt is currently presenting as Mod a for bed mobility, Min A for sit to stand and gait with HHA. Pt is presenting at baseline per daughter. Requires heavy cues for initiation of activities. Daughter is caregiver and provides support 24/7 as needed. Currently pt is presenting at baseline level of functioning and no skilled physical therapy services recommended. Pt will be discharged from skilled physical therapy services at this time; please re-consult if further needs arise.            If plan is discharge home, recommend the following: A little help with walking and/or transfers;Assist for transportation;Help with stairs or ramp for entrance;Assistance with cooking/housework;Supervision due to cognitive status     Equipment Recommendations None recommended by PT;Other (comment) (provided family with gait belt)     Functional Status Assessment Patient has not had a recent decline in their functional status     Precautions / Restrictions Precautions Precautions: Fall Recall of Precautions/Restrictions: Impaired      Mobility  Bed Mobility Overal bed mobility: Needs Assistance Bed Mobility: Supine to Sit, Sit to Supine     Supine to sit: Mod assist, +2 for safety/equipment Sit to supine: Mod assist, +2 for safety/equipment   General bed mobility comments: multimodal cues to initiate,increased time, assist for LEs over edge of stretcher and to raise trunk, assist to guide trunk and for LEs back into bed    Transfers Overall transfer level: Needs assistance Equipment used: 2 person hand held assist Transfers: Sit to/from Stand Sit to Stand: +2 physical assistance, Min assist           General transfer comment:  multimodal cues, B hand held assist    Ambulation/Gait Ambulation/Gait assistance: Min assist Gait Distance (Feet): 40 Feet Assistive device: 2 person hand held assist, 1 person hand held assist Gait Pattern/deviations: Step-through pattern, Decreased step length - right, Decreased step length - left, Wide base of support, Shuffle Gait velocity: decreased Gait velocity interpretation: <1.31 ft/sec, indicative of household ambulator   General Gait Details: very short shuffling steps with partial step through gait pattern. Pt requiring up to Mod a to wgt shift toward the L in order to clear the R foot.     Balance Overall balance assessment: Needs assistance   Sitting balance-Leahy Scale: Fair     Standing balance support: Single extremity supported Standing balance-Leahy Scale: Fair       Pertinent Vitals/Pain Pain Assessment Pain Assessment: Faces Faces Pain Scale: No hurt Breathing: normal Negative Vocalization: none Facial Expression: smiling or inexpressive Body Language: relaxed Consolability: no need to console PAINAD Score: 0 Pain Intervention(s): Monitored during session    Home Living Family/patient expects to be discharged to:: Private residence Living Arrangements: Children Available Help at Discharge: Family;Available 24 hours/day Type of Home: Apartment Home Access: Elevator       Home Layout: One level Home Equipment: Toilet riser;Wheelchair - manual Additional Comments: pt goes to adult daycare 5 days a week    Prior Function Prior Level of Function : Needs assist             Mobility Comments: walks without AD, increased time due to arthritic R knee ADLs Comments: self feeds,washes her face, otherwise total care  Extremity/Trunk Assessment   Upper Extremity Assessment Upper Extremity Assessment: Generalized weakness;Defer to OT evaluation    Lower Extremity Assessment Lower Extremity Assessment: Generalized weakness    Cervical  / Trunk Assessment Cervical / Trunk Assessment:  (weakness, obesity)  Communication   Communication Communication: Impaired Factors Affecting Communication: Reduced clarity of speech (minimally verbal)    Cognition Arousal: Alert Behavior During Therapy: WFL for tasks assessed/performed   PT - Cognitive impairments: History of cognitive impairments       Following commands: Impaired Following commands impaired: Follows one step commands inconsistently (with multimodal cues)     Cueing Cueing Techniques: Verbal cues, Gestural cues, Tactile cues, Visual cues     General Comments General comments (skin integrity, edema, etc.): daughter/caregiver present and very supportive.        Assessment/Plan    PT Assessment Patient does not need any further PT services         PT Goals (Current goals can be found in the Care Plan section)  Acute Rehab PT Goals PT Goal Formulation: All assessment and education complete, DC therapy     AM-PAC PT 6 Clicks Mobility  Outcome Measure Help needed turning from your back to your side while in a flat bed without using bedrails?: A Lot Help needed moving from lying on your back to sitting on the side of a flat bed without using bedrails?: A Lot Help needed moving to and from a bed to a chair (including a wheelchair)?: A Little Help needed standing up from a chair using your arms (e.g., wheelchair or bedside chair)?: A Little Help needed to walk in hospital room?: A Little Help needed climbing 3-5 steps with a railing? : A Lot 6 Click Score: 15    End of Session Equipment Utilized During Treatment: Gait belt Activity Tolerance: Patient tolerated treatment well Patient left: in bed;with call bell/phone within reach;with family/visitor present Nurse Communication: Mobility status      Time: 8886-8848 PT Time Calculation (min) (ACUTE ONLY): 38 min   Charges:   PT Evaluation $PT Eval Low Complexity: 1 Low   PT General Charges $$  ACUTE PT VISIT: 1 Visit         Dorothyann Maier, DPT, CLT  Acute Rehabilitation Services Office: [phone] (Secure chat preferred)   Dorothyann VEAR Maier 01/20/2024, 4:30 PM

## 2024-01-20 NOTE — H&P (Signed)
 History and Physical    Michelle Rowe FMW:[id number] DOB: 11-Feb-1940 DOA: 01/19/2024  Patient coming from: Home.  Chief Complaint: Fall.  Increased agitation.  HPI: Michelle Rowe is a 83 y.o. female with history of advanced dementia, diabetes mellitus type 2, hypertension, chronic kidney disease stage III was brought to the ER after patient had a fall at home.  As per the daughter patient has been having increasing agitation and was placed on Abilify  last week and Depakote  dose was increased at the same time.  Last month patient was placed on Rexulti  and took it for 2 weeks when patient's agitation had improved but insurance did not approve and had to be placed on Abilify  which was started last week.  Two nights back patient had a fall on the floor and was lying on the floor all night and this morning when patient's daughter checked on her she was found on the floor and was brought to the ER.  Patient's daughter checked the cameras and found that patient tripped and fell onto the floor and on falling she did hit her head..  She is finding it difficult to ambulate.  Per patient's daughter patient has not slept all night after the fall.  ED Course: In the ER CT head and C-spine were unremarkable.  Since patient also complained of abdominal discomfort CT abdomen was done which was unremarkable.  CK was mildly elevated was given a fluid bolus.  Dose of lorazepam  was given to get the scans done following which patient was being.  Creatinine is 1.6 which is at baseline.  UA is unremarkable.  EKG is pending.  Patient has chronic right knee pain for which x-rays were done which did not show anything acute except for osteoarthritic changes.  Review of Systems: As per HPI, rest all negative.   Past Medical History:  Diagnosis Date   Anterolisthesis    Grade 1 of L3 on L4 on x-ray 07/18/19   Aortic regurgitation    Seen on 2D echo 07/07/16   Cataract    CKD (chronic kidney disease), stage III (HCC)    DDD  (degenerative disc disease), lumbar    Diabetes mellitus without complication (HCC)    Diastolic CHF (HCC)    Grade 2/4 with LVEF of 60-65%   Diastolic dysfunction    Grade 2/4 with LVEF of 60-65% and LVH   Gout    HLD (hyperlipidemia)    Hyperparathyroidism    Hypertension    Lumbar facet arthropathy    Memory impairment    Mitral valve regurgitation    Seen on 2D echo 07/07/16   OA (osteoarthritis)    OSA (obstructive sleep apnea)    Osteoarthritis    Polycythemia    Shingles    Sleep apnea    Thrombocytopenia    Vitamin D deficiency     Past Surgical History:  Procedure Laterality Date   LAPAROSCOPIC HYSTERECTOMY       reports that she has never smoked. She has never used smokeless tobacco. She reports that she does not drink alcohol and does not use drugs.  Allergies[1]  Family History  Problem Relation Age of Onset   Diabetes Mother    Hypertension Father    Hypertension Brother    Hypertension Sister    Diabetes Sister     Prior to Admission medications  Medication Sig Start Date End Date Taking? Authorizing Provider  allopurinol  (ZYLOPRIM ) 100 MG tablet Take 1 tablet (100 mg total) by mouth daily.  10/26/23   Ngetich, [name] C, NP  amLODipine  (NORVASC ) 10 MG tablet Take 1 tablet (10 mg total) by mouth daily. Needs an appointment before anymore future refills. 10/26/23   Ngetich, [name] C, NP  ARIPiprazole  (ABILIFY ) 2 MG tablet Take 1 tablet (2 mg total) by mouth daily. 01/12/24   Ngetich, [name] C, NP  aspirin  81 MG chewable tablet Chew 1 tablet (81 mg total) by mouth daily. 10/26/23   Ngetich, [name] C, NP  azelastine  (ASTELIN ) 0.1 % nasal spray Place 1 spray into both nostrils 2 (two) times daily. Use in each nostril as directed 10/26/23   Ngetich, [name] C, NP  Blood Glucose Monitoring Suppl DEVI 1 each by Does not apply route in the morning, at noon, and at bedtime. May substitute to any manufacturer covered by patient's insurance. 02/18/23   Ngetich, [name] C, NP   divalproex  (DEPAKOTE ) 250 MG DR tablet Take 1 tablet (250 mg total) by mouth 2 (two) times daily. 11/17/23   Ngetich, [name] C, NP  EPINEPHrine  (EPIPEN  2-PAK) 0.3 mg/0.3 mL IJ SOAJ injection Inject 0.3 mg into the muscle as needed for anaphylaxis. 02/18/23   Ngetich, [name] C, NP  fluticasone  (FLONASE ) 50 MCG/ACT nasal spray USE 1 SPRAY(S) IN EACH NOSTRIL TWICE DAILY AS NEEDED FOR ALLERGIES 10/26/23   Ngetich, [name] C, NP  glipiZIDE  (GLUCOTROL ) 5 MG tablet Take 1 tablet (5 mg total) by mouth daily. 10/26/23   Ngetich, [name] C, NP  glucose blood test strip 1 each by Other route as needed for other. Use as instructed 10/26/23   Ngetich, [name] C, NP  guaiFENesin  (MUCINEX ) 600 MG 12 hr tablet Take 1 tablet (600 mg total) by mouth 2 (two) times daily as needed. 10/26/23   Ngetich, [name] C, NP  hydrALAZINE  (APRESOLINE ) 25 MG tablet Take 1 tablet (25 mg total) by mouth 3 (three) times daily. 10/26/23   Ngetich, [name] C, NP  ipratropium (ATROVENT ) 0.06 % nasal spray Place 2 sprays into both nostrils 4 (four) times daily. 10/26/23   Ngetich, [name] C, NP  Lancets Ultra Thin 30G MISC Inject 1 each into the skin daily. 10/26/23   Ngetich, [name] C, NP  loratadine  (CLARITIN ) 10 MG tablet Take 1 tablet (10 mg total) by mouth daily as needed for allergies. 10/26/23   Ngetich, [name] C, NP  LORazepam  (ATIVAN ) 0.5 MG tablet Take 1 tablet (0.5 mg total) by mouth 2 (two) times daily as needed. for anxiety 10/26/23   Ngetich, [name] C, NP  losartan  (COZAAR ) 50 MG tablet Take 1 tablet (50 mg total) by mouth daily. 10/26/23   Ngetich, [name] C, NP  nystatin  cream (MYCOSTATIN ) Apply 1 Application topically 2 (two) times daily. 10/26/23   Ngetich, [name] C, NP  simvastatin  (ZOCOR ) 10 MG tablet Take 1 tablet (10 mg total) by mouth at bedtime. 10/26/23   Ngetich, Roxan BROCKS, NP    Physical Exam: Constitutional: Moderately built and nourished. Vitals:   01/19/24 2337 01/20/24 0430 01/20/24 0441 01/20/24 0455  BP: (!) 105/90   109/72  Pulse: 72   61 (!) 54  Resp: 16   17  Temp: (!) 97.4 F (36.3 C)     TempSrc: Oral     SpO2: 100% 100% 100% 100%  Weight:      Height:       Eyes: Anicteric no pallor. ENMT: No discharge from the ears eyes nose or mouth. Neck: No mass felt.  No neck rigidity. Respiratory: No rhonchi or crepitations. Cardiovascular: S1-S2 heard. Abdomen: Soft  nontender bowel sound present. Musculoskeletal: No edema. Skin: No rash. Neurologic: Patient was sleeping but easily arousable oriented to her name.  Moving all extremities. Psychiatric: Oriented to her name.   Labs on Admission: I have personally reviewed following labs and imaging studies  CBC: Recent Labs  Lab 01/19/24 1238  WBC 6.2  NEUTROABS 3.7  HGB 12.1  HCT 38.5  MCV 98.7  PLT 299   Basic Metabolic Panel: Recent Labs  Lab 01/19/24 1238  NA 141  K 4.1  CL 108  CO2 22  GLUCOSE 144*  BUN 27*  CREATININE 1.61*  CALCIUM 8.9   GFR: Estimated Creatinine Clearance: 28.5 mL/min (A) (by C-G formula based on SCr of 1.61 mg/dL (H)). Liver Function Tests: Recent Labs  Lab 01/19/24 1238  AST 31  ALT 17  ALKPHOS 56  BILITOT 0.6  PROT 7.4  ALBUMIN 3.1*   No results for input(s): LIPASE, AMYLASE in the last 168 hours. No results for input(s): AMMONIA in the last 168 hours. Coagulation Profile: No results for input(s): INR, PROTIME in the last 168 hours. Cardiac Enzymes: Recent Labs  Lab 01/19/24 1238 01/19/24 2148  CKTOTAL 619* 652*   BNP (last 3 results) No results for input(s): PROBNP in the last 8760 hours. HbA1C: No results for input(s): HGBA1C in the last 72 hours. CBG: Recent Labs  Lab 01/19/24 2140  GLUCAP 165*   Lipid Profile: No results for input(s): CHOL, HDL, LDLCALC, TRIG, CHOLHDL, LDLDIRECT in the last 72 hours. Thyroid Function Tests: No results for input(s): TSH, T4TOTAL, FREET4, T3FREE, THYROIDAB in the last 72 hours. Anemia Panel: No results for input(s):  VITAMINB12, FOLATE, FERRITIN, TIBC, IRON, RETICCTPCT in the last 72 hours. Urine analysis:    Component Value Date/Time   COLORURINE YELLOW 01/19/2024 2236   APPEARANCEUR CLEAR 01/19/2024 2236   LABSPEC 1.020 01/19/2024 2236   PHURINE 5.0 01/19/2024 2236   GLUCOSEU NEGATIVE 01/19/2024 2236   HGBUR NEGATIVE 01/19/2024 2236   BILIRUBINUR NEGATIVE 01/19/2024 2236   BILIRUBINUR Negative 09/21/2019 1533   KETONESUR NEGATIVE 01/19/2024 2236   PROTEINUR NEGATIVE 01/19/2024 2236   UROBILINOGEN 0.2 09/21/2019 1533   NITRITE NEGATIVE 01/19/2024 2236   LEUKOCYTESUR TRACE (A) 01/19/2024 2236   Sepsis Labs: @LABRCNTIP (procalcitonin:4,lacticidven:4) )No results found for this or any previous visit (from the past 240 hours).   Radiological Exams on Admission: CT ABDOMEN PELVIS W CONTRAST Result Date: 01/20/2024 EXAM: CT ABDOMEN AND PELVIS WITH CONTRAST 01/20/2024 12:28:49 AM TECHNIQUE: CT of the abdomen and pelvis was performed with the administration of 75 mL of iohexol (OMNIPAQUE) 350 MG/ML injection. Multiplanar reformatted images are provided for review. Automated exposure control, iterative reconstruction, and/or weight-based adjustment of the mA/kV was utilized to reduce the radiation dose to as low as reasonably achievable. COMPARISON: None available. CLINICAL HISTORY: Abdominal pain, acute, nonlocalized. FINDINGS: LOWER CHEST: No acute abnormality. LIVER: The liver is unremarkable. GALLBLADDER AND BILE DUCTS: Gallbladder is unremarkable. No biliary ductal dilatation. SPLEEN: No acute abnormality. PANCREAS: No acute abnormality. ADRENAL GLANDS: No acute abnormality. KIDNEYS, URETERS AND BLADDER: Simple bilateral renal cysts, measuring up to 5.8 cm in the left upper kidney, benign. No follow-up is recommended. Per consensus, no follow-up is needed for simple Bosniak type 1 and 2 renal cysts, unless the patient has a malignancy history or risk factors. No stones in the kidneys or ureters.  No hydronephrosis. No perinephric or periureteral stranding. Urinary bladder is unremarkable. GI AND BOWEL: Stomach demonstrates no acute abnormality. Tiny hiatal hernia. There is no bowel  obstruction. PERITONEUM AND RETROPERITONEUM: No ascites. No free air. VASCULATURE: Aorta is normal in caliber. Atherosclerotic calcifications of the abdominal aorta. LYMPH NODES: No lymphadenopathy. REPRODUCTIVE ORGANS: Status post hysterectomy. BONES AND SOFT TISSUES: Degenerative changes of the visualized thoracolumbar spine. Moderate fat-containing periumbilical hernia. No acute osseous abnormality. No other focal soft tissue abnormality. IMPRESSION: 1. No acute findings in the abdomen or pelvis. Electronically signed by: Pinkie Pebbles MD 01/20/2024 12:40 AM EST RP Workstation: [id number]   DG Knee Complete 4 Views Right Result Date: 01/19/2024 CLINICAL DATA:  Pain. EXAM: RIGHT KNEE - COMPLETE 4+ VIEW COMPARISON:  06/18/2021. FINDINGS: No acute fracture or dislocation. Severe lateral compartment osteoarthritis with joint space narrowing and osteophytosis. Moderate patellofemoral and medial compartment osteoarthritis. Small joint effusion. IMPRESSION: 1. No acute osseous abnormality. 2. Moderate to severe tricompartmental osteoarthritis, most pronounced in the lateral femorotibial compartment. Electronically Signed   By: Harrietta Sherry M.D.   On: 01/19/2024 13:39   CT Cervical Spine Wo Contrast Result Date: 01/19/2024 EXAM: CT Cervical Spine Without Intravenous Contrast. CLINICAL HISTORY: 83 year old female status post fall, found down on floor. Neck trauma. TECHNIQUE: Axial computed tomography images of the cervical spine without intravenous contrast. Sagittal and coronal reformations performed. Dose reduction technique was used including one or more of the following: automated exposure control, adjustment of mA and kV according to patient size, and/or iterative reconstruction. COMPARISON: CT head 01/19/2024 12:48:07  PM reported separately. FINDINGS: BONES: Straightening and mild reversal of the normal cervical lordosis. No acute fracture or focal osseous lesion. Degenerative facet ankylosis on the right at C2-C3. Normal bone mineralization for age. Flowing anterior endplate osteophytes in the lower cervical spine. No other levels of ankylosis identified. DISCS / DEGENERATIVE CHANGES: Partially calcified degenerative ligamentous hypertrophy about the odontoid. Chronic cervical spine degeneration superimposed on degenerative C2-C3 facet ankylosis. Cervical disc degeneration appears most pronounced at C4-C5, with a partially calcified midline disc protrusion there. No significant spinal stenosis by CT. SOFT TISSUES: No prevertebral soft tissue swelling. Retropharyngeal course of the carotid arteries in the neck with mild calcified atherosclerosis. Otherwise negative visible non-contrast neck soft tissues. Negative visible non-contrast thoracic inlet. IMPRESSION: 1. No acute traumatic injury identified in the cervical spine. 2. Chronic cervical spine degeneration superimposed on degenerative C2 C3 facet ankylosis. Electronically signed by: Helayne Hurst MD 01/19/2024 01:03 PM EST RP Workstation: [id number]   CT Head Wo Contrast Result Date: 01/19/2024 EXAM: CT Head Without Intravenous Contrast. CLINICAL HISTORY: 83 year old female with minor head trauma, found down on the floor this morning. TECHNIQUE: Axial computed tomography images of the head/brain without intravenous contrast. Dose reduction technique was used including one or more of the following: automated exposure control, adjustment of mA and kV according to patient size, and/or iterative reconstruction. CONTRAST: Without. COMPARISON: None. FINDINGS: BRAIN: Brain volume within normal limits for age. Mild for age periventricular white matter hypodensity. No acute intraparenchymal hemorrhage. No mass lesion. No CT evidence for acute territorial infarct. No midline shift  or extra-axial collection. Calcified atherosclerosis at the skull base. No suspicious intracranial vascular hyperdensity. VENTRICLES: No hydrocephalus. SINUSES AND MASTOIDS: The paranasal sinuses, tympanic cavities, and mastoids are well aerated. SOFT TISSUES: No acute orbital or scalp soft tissue injury identified. No radiopaque foreign body is seen. BONES: No acute skull fracture. IMPRESSION: 1. No acute traumatic injury identified. 2. Normal for age non-contrast CT appearance of the brain. 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 Lindzen on-call neurologist.  Dr. Lindzen advised that since patient's medications Abilify  and Depakote  were recently introduced and also dose increased will continue at least for 2 more weeks before any changes made. Fall with mild rhabdomyolysis.  Did receive fluids.  Get physical therapy consult.  Recheck CK levels.  EKG is pending. Diabetes mellitus type 2 will keep patient on sliding scale coverage.  Check hemoglobin A1c. Hypertension on hydralazine  amlodipine  and ARB. Chronic kidney disease stage III-IV creatinine appears to be at baseline. History of gout on allopurinol . Osteoarthritis of the right knee.  DVT prophylaxis: Heparin. Code Status: Full code. Family Communication: Discussed with patient's daughter. Disposition Plan: Medical floor. Consults called: discussed with neurologist.  Physical therapy  consult. Admission status: The patient.         [1]  Allergies Allergen Reactions   Augmentin [Amoxicillin-Pot Clavulanate] Rash    Critically high - Onset 07/13/2019   Amoxicillin     Did it involve swelling of the face/tongue/throat, SOB, or low BP? Y Did it involve sudden or severe rash/hives, skin peeling, or any reaction on the inside of your mouth or nose? Y (Rash, Swelling, SOB) Did you need to seek medical attention at a hospital or doctor's office? Y When did it last happen?  Yesterday     If all above answers are NO, may proceed with cephalosporin use.    Shellfish Allergy 

## 2024-01-21 DIAGNOSIS — F03918 Unspecified dementia, unspecified severity, with other behavioral disturbance: Secondary | ICD-10-CM | POA: Diagnosis not present

## 2024-01-21 LAB — COMPREHENSIVE METABOLIC PANEL WITH GFR
ALT: 15 U/L (ref 0–44)
AST: 24 U/L (ref 15–41)
Albumin: 2.5 g/dL — ABNORMAL LOW (ref 3.5–5.0)
Alkaline Phosphatase: 48 U/L (ref 38–126)
Anion gap: 5 (ref 5–15)
BUN: 25 mg/dL — ABNORMAL HIGH (ref 8–23)
CO2: 22 mmol/L (ref 22–32)
Calcium: 8.5 mg/dL — ABNORMAL LOW (ref 8.9–10.3)
Chloride: 113 mmol/L — ABNORMAL HIGH (ref 98–111)
Creatinine, Ser: 1.62 mg/dL — ABNORMAL HIGH (ref 0.44–1.00)
GFR, Estimated: 31 mL/min — ABNORMAL LOW (ref >60)
Glucose, Bld: 109 mg/dL — ABNORMAL HIGH (ref 70–99)
Potassium: 4.5 mmol/L (ref 3.5–5.1)
Sodium: 140 mmol/L (ref 135–145)
Total Bilirubin: 0.4 mg/dL (ref 0.0–1.2)
Total Protein: 6 g/dL — ABNORMAL LOW (ref 6.5–8.1)

## 2024-01-21 LAB — CBC WITH DIFFERENTIAL/PLATELET
Abs Immature Granulocytes: 0.01 K/uL (ref 0.00–0.07)
Basophils Absolute: 0 K/uL (ref 0.0–0.1)
Basophils Relative: 1 %
Eosinophils Absolute: 0 K/uL (ref 0.0–0.5)
Eosinophils Relative: 0 %
HCT: 33.2 % — ABNORMAL LOW (ref 36.0–46.0)
Hemoglobin: 11.1 g/dL — ABNORMAL LOW (ref 12.0–15.0)
Immature Granulocytes: 0 %
Lymphocytes Relative: 57 %
Lymphs Abs: 3 K/uL (ref 0.7–4.0)
MCH: 32.6 pg (ref 26.0–34.0)
MCHC: 33.4 g/dL (ref 30.0–36.0)
MCV: 97.6 fL (ref 80.0–100.0)
Monocytes Absolute: 0.5 K/uL (ref 0.1–1.0)
Monocytes Relative: 10 %
Neutro Abs: 1.7 K/uL (ref 1.7–7.7)
Neutrophils Relative %: 32 %
Platelets: 242 K/uL (ref 150–400)
RBC: 3.4 MIL/uL — ABNORMAL LOW (ref 3.87–5.11)
RDW: 13.2 % (ref 11.5–15.5)
WBC: 5.2 K/uL (ref 4.0–10.5)
nRBC: 0 % (ref 0.0–0.2)

## 2024-01-21 LAB — GLUCOSE, CAPILLARY
Glucose-Capillary: 92 mg/dL (ref 70–99)
Glucose-Capillary: 155 mg/dL — ABNORMAL HIGH (ref 70–99)

## 2024-01-21 LAB — CK: Total CK: 308 U/L — ABNORMAL HIGH (ref 38–234)

## 2024-01-21 MED ADMIN — Allopurinol Tab 100 MG: 100 mg | ORAL | NDC 29300034901

## 2024-01-21 MED ADMIN — Divalproex Sodium Tab Delayed Release 250 MG: 250 mg | ORAL | NDC 00904686061

## 2024-01-21 MED ADMIN — Aspirin Chew Tab 81 MG: 81 mg | ORAL | NDC 00904679480

## 2024-01-21 NOTE — Discharge Summary (Signed)
 Physician Discharge Summary  Michelle Rowe FMW:[id number] DOB: 06-29-1940 DOA: 01/19/2024  PCP: Leonarda Roxan BROCKS, NP  Admit date: 01/19/2024 Discharge date: 01/21/2024  Admitted from: Home Discharge disposition: Home  Subjective: Patient was seen and examined this morning. Patient elderly African-American female.  Sitting up in recliner. Not in distress. Her daughter Sharlet was at bedside Afebrile, heart rate in 50s and 60s, blood pressure in normal range. Daughter states that patient is back to baseline and see would feel more comfortable taking care of her at home. Labs from this morning with BUN/creatinine 25/1.62, CK improving to 308  Brief narrative: [name] is a 83 y.o. female with PMH significant for obesity, OSA, DM2, HTN, HLD, CHF, CKD, hyperparathyroidism, osteoarthritis, chronic back pain Patient lives at home Per family, patient has been having increasing agitation lately.  Also having difficulty ambulation  Last month, she was placed on Rexulti  which helped to improve agitation, but the insurance did not approve and hence she could not continue it more than 2 weeks.   Last week week she was started on Abilify , Depakote  dose was increased.  12/11, patient was brought to the ED after she was found on the floor. Daughter checked a security camera and noted that the previous night, patient tripped, lost her balance and fell on the floor, laid there all night only to be found by daughter next morning.  In the ED, patient was afebrile, hemodynamically stable CT head and CT C-spine were unremarkable Patient also complained of abdominal discomfort and hence CT abdomen was done which was unremarkable.   X-rays of the knee did not show any acute finding except osteoarthritic changes Blood work showed mildly elevated CK to 619, creatinine 1.6 at baseline UA unremarkable  Admitted to [facility] for further workup and management  Hospital course: Fall Mild  rhabdomyolysis Had a mechanical fall, unable to get up from the floor overnight CK level was elevated to 619 CK improving with IV fluid.  Encourage oral hydration Seen by PT.  Ambulated independently.  No follow-up recommended Recent Labs  Lab 01/19/24 1238 01/19/24 2148 01/20/24 0634 01/21/24 0506  CKTOTAL 619* 652* 519* 308*   Dementia with behavioral symptoms Per family, last month, she was placed on Rexulti  which helped to improve agitation, but the insurance did not approve and hence she could not continue it more than 2 weeks.   Last week week she was started on Abilify , Depakote  dose was increased. Admitting physician discussed with on-call neurology Dr. Lindzen who advised that since patient's medications Abilify  and Depakote  were recently introduced and also dose increased will continue at least for 2 more weeks before any changes made. Mental status has remained stable while in the hospital.  Pleasantly demented. Continue Abilify  2 mg daily, Depakote  250 mg twice daily, Ativan  0.5 mg twice daily as needed  Type 2 diabetes mellitus A1c 6.7 on September 2025 Continue glipizide  5 mg daily before.  Hypertension PTA meds- amlodipine  10 mg daily, hydralazine  25 mg 3 times daily, losartan  50 mg daily Continue all  HLD Continue aspirin , simvastatin   CKD 3B Creatinine remains at baseline Recent Labs    02/18/23 0954 07/31/23 2136 10/26/23 1050 01/19/24 1238 01/20/24 0634 01/21/24 0506  BUN 24 35* 31* 27* 28* 25*  CREATININE 1.26* 1.51* 1.66* 1.61* 1.51* 1.62*  CO2 23 20* 21 22 21* 22   Osteoarthritis Gout Continue allopurinol , Pain regimen ---- PRN: Tylenol   Morbid Obesity  Body mass index is 39.64 kg/m. Patient has been  advised to make an attempt to improve diet and exercise patterns to aid in weight loss.   Goals of care   Code Status: Full Code   Diet:  Diet Order             Diet general           Diet heart healthy/carb modified Room service  appropriate? Yes; Fluid consistency: Thin  Diet effective now                   Nutritional status:  Body mass index is 39.64 kg/m.       Wounds:  -    Discharge Medications:   Allergies as of 01/21/2024       Reactions   Augmentin [amoxicillin-pot Clavulanate] Rash   Critically high - Onset 07/13/2019   Amoxicillin    Did it involve swelling of the face/tongue/throat, SOB, or low BP? Y Did it involve sudden or severe rash/hives, skin peeling, or any reaction on the inside of your mouth or nose? Y (Rash, Swelling, SOB) Did you need to seek medical attention at a hospital or doctor's office? Y When did it last happen?  Yesterday     If all above answers are NO, may proceed with cephalosporin use.   Shellfish Allergy          Medication List     TAKE these medications    allopurinol  100 MG tablet Commonly known as: ZYLOPRIM  Take 1 tablet (100 mg total) by mouth daily.   amLODipine  10 MG tablet Commonly known as: NORVASC  Take 1 tablet (10 mg total) by mouth daily. Needs an appointment before anymore future refills.   ARIPiprazole  2 MG tablet Commonly known as: Abilify  Take 1 tablet (2 mg total) by mouth daily.   aspirin  81 MG chewable tablet Chew 1 tablet (81 mg total) by mouth daily.   azelastine  0.1 % nasal spray Commonly known as: ASTELIN  Place 1 spray into both nostrils 2 (two) times daily. Use in each nostril as directed   Blood Glucose Monitoring Suppl Devi 1 each by Does not apply route in the morning, at noon, and at bedtime. May substitute to any manufacturer covered by patient's insurance.   divalproex  250 MG DR tablet Commonly known as: DEPAKOTE  Take 1 tablet (250 mg total) by mouth 2 (two) times daily.   EPINEPHrine  0.3 mg/0.3 mL Soaj injection Commonly known as: EpiPen  2-Pak Inject 0.3 mg into the muscle as needed for anaphylaxis.   fluticasone  50 MCG/ACT nasal spray Commonly known as: FLONASE  USE 1 SPRAY(S) IN EACH NOSTRIL TWICE  DAILY AS NEEDED FOR ALLERGIES   glipiZIDE  5 MG tablet Commonly known as: GLUCOTROL  Take 1 tablet (5 mg total) by mouth daily.   glucose blood test strip 1 each by Other route as needed for other. Use as instructed   guaiFENesin  600 MG 12 hr tablet Commonly known as: MUCINEX  Take 1 tablet (600 mg total) by mouth 2 (two) times daily as needed. What changed: reasons to take this   hydrALAZINE  25 MG tablet Commonly known as: APRESOLINE  Take 1 tablet (25 mg total) by mouth 3 (three) times daily.   ipratropium 0.06 % nasal spray Commonly known as: ATROVENT  Place 2 sprays into both nostrils 4 (four) times daily. What changed:  when to take this reasons to take this   Lancets Ultra Thin 30G Misc Inject 1 each into the skin daily.   loratadine  10 MG tablet Commonly known as: CLARITIN  Take 1 tablet (10 mg  total) by mouth daily as needed for allergies.   LORazepam  0.5 MG tablet Commonly known as: ATIVAN  Take 1 tablet (0.5 mg total) by mouth 2 (two) times daily as needed. for anxiety   losartan  50 MG tablet Commonly known as: COZAAR  Take 1 tablet (50 mg total) by mouth daily.   nystatin  cream Commonly known as: MYCOSTATIN  Apply 1 Application topically 2 (two) times daily. What changed:  when to take this reasons to take this   simvastatin  10 MG tablet Commonly known as: ZOCOR  Take 1 tablet (10 mg total) by mouth at bedtime.         Follow ups:    Follow-up Information     Ngetich, [name] C, NP Follow up.   Specialty: Family Medicine Contact information: [address] [place] [address] [phone]                 Discharge Instructions:   Discharge Instructions     Call MD for:  difficulty breathing, headache or visual disturbances   Complete by: As directed    Call MD for:  extreme fatigue   Complete by: As directed    Call MD for:  hives   Complete by: As directed    Call MD for:  persistant dizziness or light-headedness   Complete by: As  directed    Call MD for:  persistant nausea and vomiting   Complete by: As directed    Call MD for:  severe uncontrolled pain   Complete by: As directed    Call MD for:  temperature >100.4   Complete by: As directed    Diet general   Complete by: As directed    Discharge instructions   Complete by: As directed    General discharge instructions: Follow with Primary MD Ngetich, Roxan BROCKS, NP in 7 days  Please request your PCP  to go over your hospital tests, procedures, radiology results at the follow up. Please get your medicines reviewed and adjusted.  Your PCP may decide to repeat certain labs or tests as needed. Do not drive, operate heavy machinery, perform activities at heights, swimming or participation in water activities or provide baby sitting services if your were admitted for syncope or siezures until you have seen by Primary MD or a Neurologist and advised to do so again. [place]  Controlled Substance Reporting System database was reviewed. Do not drive, operate heavy machinery, perform activities at heights, swim, participate in water activities or provide baby-sitting services while on medications for pain, sleep and mood until your outpatient physician has reevaluated you and advised to do so again.  You are strongly recommended to comply with the dose, frequency and duration of prescribed medications. Activity: As tolerated with Full fall precautions use walker/cane & assistance as needed Avoid using any recreational substances like cigarette, tobacco, alcohol, or non-prescribed drug. If you experience worsening of your admission symptoms, develop shortness of breath, life threatening emergency, suicidal or homicidal thoughts you must seek medical attention immediately by calling 911 or calling your MD immediately  if symptoms less severe. You must read complete instructions/literature along with all the possible adverse reactions/side effects for all the medicines you take and  that have been prescribed to you. Take any new medicine only after you have completely understood and accepted all the possible adverse reactions/side effects.  Wear Seat belts while driving. You were cared for by a hospitalist during your hospital stay. If you have any questions about your discharge medications or  the care you received while you were in the hospital after you are discharged, you can call the unit and ask to speak with the hospitalist or the covering physician. Once you are discharged, your primary care physician will handle any further medical issues. Please note that NO REFILLS for any discharge medications will be authorized once you are discharged, as it is imperative that you return to your primary care physician (or establish a relationship with a primary care physician if you do not have one).   Increase activity slowly   Complete by: As directed        Discharge Exam:   Vitals:   01/20/24 2051 01/20/24 2237 01/21/24 0806 01/21/24 1123  BP: (!) 125/108 (!) 125/108 (!) 113/53 112/66  Pulse: 63  (!) 56 (!) 59  Resp:   16 17  Temp: 98.2 F (36.8 C)  (!) 97.1 F (36.2 C) 98.5 F (36.9 C)  TempSrc:      SpO2: 100%  100% 100%  Weight:      Height:        Body mass index is 39.64 kg/m.   General exam: Pleasant, elderly African-American female Skin: No rashes, lesions or ulcers. HEENT: Atraumatic, normocephalic, no obvious bleeding Lungs: Clear to auscultation bilaterally,  CVS: S1, S2, no murmur,   GI/Abd: Soft, nontender, nondistended, bowel sound present,   CNS: Answers with few words with eyes closed.  Voice clear Extremities: No pedal edema, no calf tenderness,    The results of significant diagnostics from this hospitalization (including imaging, microbiology, ancillary and laboratory) are listed below for reference.    Procedures and Diagnostic Studies:   CT ABDOMEN PELVIS W CONTRAST Result Date: 01/20/2024 EXAM: CT ABDOMEN AND PELVIS WITH CONTRAST  01/20/2024 12:28:49 AM TECHNIQUE: CT of the abdomen and pelvis was performed with the administration of 75 mL of iohexol  (OMNIPAQUE ) 350 MG/ML injection. Multiplanar reformatted images are provided for review. Automated exposure control, iterative reconstruction, and/or weight-based adjustment of the mA/kV was utilized to reduce the radiation dose to as low as reasonably achievable. COMPARISON: None available. CLINICAL HISTORY: Abdominal pain, acute, nonlocalized. FINDINGS: LOWER CHEST: No acute abnormality. LIVER: The liver is unremarkable. GALLBLADDER AND BILE DUCTS: Gallbladder is unremarkable. No biliary ductal dilatation. SPLEEN: No acute abnormality. PANCREAS: No acute abnormality. ADRENAL GLANDS: No acute abnormality. KIDNEYS, URETERS AND BLADDER: Simple bilateral renal cysts, measuring up to 5.8 cm in the left upper kidney, benign. No follow-up is recommended. Per consensus, no follow-up is needed for simple Bosniak type 1 and 2 renal cysts, unless the patient has a malignancy history or risk factors. No stones in the kidneys or ureters. No hydronephrosis. No perinephric or periureteral stranding. Urinary bladder is unremarkable. GI AND BOWEL: Stomach demonstrates no acute abnormality. Tiny hiatal hernia. There is no bowel obstruction. PERITONEUM AND RETROPERITONEUM: No ascites. No free air. VASCULATURE: Aorta is normal in caliber. Atherosclerotic calcifications of the abdominal aorta. LYMPH NODES: No lymphadenopathy. REPRODUCTIVE ORGANS: Status post hysterectomy. BONES AND SOFT TISSUES: Degenerative changes of the visualized thoracolumbar spine. Moderate fat-containing periumbilical hernia. No acute osseous abnormality. No other focal soft tissue abnormality. IMPRESSION: 1. No acute findings in the abdomen or pelvis. Electronically signed by: Pinkie Pebbles MD 01/20/2024 12:40 AM EST RP Workstation: [id number]   DG Knee Complete 4 Views Right Result Date: 01/19/2024 CLINICAL DATA:  Pain. EXAM: RIGHT  KNEE - COMPLETE 4+ VIEW COMPARISON:  06/18/2021. FINDINGS: No acute fracture or dislocation. Severe lateral compartment osteoarthritis with joint space narrowing and osteophytosis. Moderate  patellofemoral and medial compartment osteoarthritis. Small joint effusion. IMPRESSION: 1. No acute osseous abnormality. 2. Moderate to severe tricompartmental osteoarthritis, most pronounced in the lateral femorotibial compartment. Electronically Signed   By: Harrietta Sherry M.D.   On: 01/19/2024 13:39   CT Cervical Spine Wo Contrast Result Date: 01/19/2024 EXAM: CT Cervical Spine Without Intravenous Contrast. CLINICAL HISTORY: 83 year old female status post fall, found down on floor. Neck trauma. TECHNIQUE: Axial computed tomography images of the cervical spine without intravenous contrast. Sagittal and coronal reformations performed. Dose reduction technique was used including one or more of the following: automated exposure control, adjustment of mA and kV according to patient size, and/or iterative reconstruction. COMPARISON: CT head 01/19/2024 12:48:07 PM reported separately. FINDINGS: BONES: Straightening and mild reversal of the normal cervical lordosis. No acute fracture or focal osseous lesion. Degenerative facet ankylosis on the right at C2-C3. Normal bone mineralization for age. Flowing anterior endplate osteophytes in the lower cervical spine. No other levels of ankylosis identified. DISCS / DEGENERATIVE CHANGES: Partially calcified degenerative ligamentous hypertrophy about the odontoid. Chronic cervical spine degeneration superimposed on degenerative C2-C3 facet ankylosis. Cervical disc degeneration appears most pronounced at C4-C5, with a partially calcified midline disc protrusion there. No significant spinal stenosis by CT. SOFT TISSUES: No prevertebral soft tissue swelling. Retropharyngeal course of the carotid arteries in the neck with mild calcified atherosclerosis. Otherwise negative visible non-contrast  neck soft tissues. Negative visible non-contrast thoracic inlet. IMPRESSION: 1. No acute traumatic injury identified in the cervical spine. 2. Chronic cervical spine degeneration superimposed on degenerative C2 C3 facet ankylosis. Electronically signed by: Helayne Hurst MD 01/19/2024 01:03 PM EST RP Workstation: [id number]   CT Head Wo Contrast Result Date: 01/19/2024 EXAM: CT Head Without Intravenous Contrast. CLINICAL HISTORY: 83 year old female with minor head trauma, found down on the floor this morning. TECHNIQUE: Axial computed tomography images of the head/brain without intravenous contrast. Dose reduction technique was used including one or more of the following: automated exposure control, adjustment of mA and kV according to patient size, and/or iterative reconstruction. CONTRAST: Without. COMPARISON: None. FINDINGS: BRAIN: Brain volume within normal limits for age. Mild for age periventricular white matter hypodensity. No acute intraparenchymal hemorrhage. No mass lesion. No CT evidence for acute territorial infarct. No midline shift or extra-axial collection. Calcified atherosclerosis at the skull base. No suspicious intracranial vascular hyperdensity. VENTRICLES: No hydrocephalus. SINUSES AND MASTOIDS: The paranasal sinuses, tympanic cavities, and mastoids are well aerated. SOFT TISSUES: No acute orbital or scalp soft tissue injury identified. No radiopaque foreign body is seen. BONES: No acute skull fracture. IMPRESSION: 1. No acute traumatic injury identified. 2. Normal for age non-contrast CT appearance of the brain. Electronically signed by: Helayne Hurst MD 01/19/2024 01:00 PM EST RP Workstation: [id number]     Labs:   Basic Metabolic Panel: Recent Labs  Lab 01/19/24 1238 01/20/24 0634 01/21/24 0506  NA 141 139 140  K 4.1 5.0 4.5  CL 108 108 113*  CO2 22 21* 22  GLUCOSE 144* 134* 109*  BUN 27* 28* 25*  CREATININE 1.61* 1.51* 1.62*  CALCIUM 8.9 8.5* 8.5*   GFR Estimated  Creatinine Clearance: 27.8 mL/min (A) (by C-G formula based on SCr of 1.62 mg/dL (H)). Liver Function Tests: Recent Labs  Lab 01/19/24 1238 01/20/24 0634 01/21/24 0506  AST 31 25 24   ALT 17 12 15   ALKPHOS 56 51 48  BILITOT 0.6 0.7 0.4  PROT 7.4 6.2* 6.0*  ALBUMIN 3.1* 2.7* 2.5*   No results for input(s): LIPASE, AMYLASE  in the last 168 hours. No results for input(s): AMMONIA in the last 168 hours. Coagulation profile No results for input(s): INR, PROTIME in the last 168 hours.  CBC: Recent Labs  Lab 01/19/24 1238 01/20/24 0634 01/21/24 0506  WBC 6.2 6.1 5.2  NEUTROABS 3.7 3.6 1.7  HGB 12.1 11.6* 11.1*  HCT 38.5 38.1 33.2*  MCV 98.7 104.1* 97.6  PLT 299 262 242   Cardiac Enzymes: Recent Labs  Lab 01/19/24 1238 01/19/24 2148 01/20/24 0634 01/21/24 0506  CKTOTAL 619* 652* 519* 308*   BNP: Invalid input(s): POCBNP CBG: Recent Labs  Lab 01/20/24 1301 01/20/24 1612 01/20/24 2028 01/21/24 0806 01/21/24 1122  GLUCAP 169* 198* 124* 92 155*   D-Dimer No results for input(s): DDIMER in the last 72 hours. Hgb A1c No results for input(s): HGBA1C in the last 72 hours. Lipid Profile No results for input(s): CHOL, HDL, LDLCALC, TRIG, CHOLHDL, LDLDIRECT in the last 72 hours. Thyroid  function studies Recent Labs    01/20/24 0634  TSH 1.814   Anemia work up No results for input(s): VITAMINB12, FOLATE, FERRITIN, TIBC, IRON, RETICCTPCT in the last 72 hours. Microbiology No results found for this or any previous visit (from the past 240 hours).  Time coordinating discharge: 45 minutes  Signed: Orlandus Borowski  [facility] 01/21/2024, 11:46 AM

## 2024-01-21 NOTE — Plan of Care (Signed)
°  Problem: Coping: Goal: Ability to adjust to condition or change in health will improve Outcome: Progressing   Problem: Nutritional: Goal: Maintenance of adequate nutrition will improve Outcome: Progressing   Problem: Tissue Perfusion: Goal: Adequacy of tissue perfusion will improve Outcome: Progressing   Problem: Skin Integrity: Goal: Risk for impaired skin integrity will decrease Outcome: Progressing   Problem: Clinical Measurements: Goal: Cardiovascular complication will be avoided Outcome: Progressing

## 2024-01-26 ENCOUNTER — Ambulatory Visit: Payer: Self-pay

## 2024-01-26 ENCOUNTER — Ambulatory Visit: Admitting: Orthopedic Surgery

## 2024-01-26 ENCOUNTER — Telehealth: Payer: Self-pay | Admitting: Family

## 2024-01-26 ENCOUNTER — Encounter: Payer: Self-pay | Admitting: Orthopedic Surgery

## 2024-01-26 VITALS — BP 120/80 | HR 70 | Temp 97.3°F | Ht 62.0 in | Wt 213.8 lb

## 2024-01-26 DIAGNOSIS — F0280 Dementia in other diseases classified elsewhere without behavioral disturbance: Secondary | ICD-10-CM

## 2024-01-26 DIAGNOSIS — Z91148 Patient's other noncompliance with medication regimen for other reason: Secondary | ICD-10-CM

## 2024-01-26 DIAGNOSIS — G301 Alzheimer's disease with late onset: Secondary | ICD-10-CM | POA: Diagnosis not present

## 2024-01-26 DIAGNOSIS — R2681 Unsteadiness on feet: Secondary | ICD-10-CM

## 2024-01-26 DIAGNOSIS — R350 Frequency of micturition: Secondary | ICD-10-CM | POA: Diagnosis not present

## 2024-01-26 MED ORDER — SULFAMETHOXAZOLE-TRIMETHOPRIM 400-80 MG PO TABS: 1.0000 | ORAL_TABLET | Freq: Two times a day (BID) | ORAL | 0 refills | Status: AC

## 2024-01-26 MED ORDER — DIVALPROEX SODIUM 125 MG PO CSDR: 250.0000 mg | DELAYED_RELEASE_CAPSULE | Freq: Two times a day (BID) | ORAL | 1 refills | Status: AC

## 2024-01-26 NOTE — Progress Notes (Signed)
 Careteam: Patient Care Team: Ngetich, Roxan BROCKS, NP as PCP - General (Family Medicine)  Seen by: Greig Cluster, AGNP-C  PLACE OF SERVICE:  [facility]  Advanced Directive information    Allergies[1]  Chief Complaint  Patient presents with   Acute Visit    Urinary symptoms, urine odor for 3 days.     HPI: Patient is a 83 y.o. female seen today for acute visit due to urinary frequency and odor.   Daughter present during encounter.   Michelle Rowe is an 83 year old female with dementia who presents with symptoms suggestive of a urinary tract infection. She is accompanied by her daughter, who is also her primary caregiver.  Her daughter reports a strong, unusual smell and cloudy appearance of her urine, first noticed yesterday. There is an increase in confusion and behavioral changes. She was recently hospitalized, and no UTI was detected at that time.  Her daughter is unsure if she is experiencing any pain, including back pain, which can be associated with UTIs. Her fluid intake has decreased, particularly her water consumption, which is not typical for her. At home, she is on a diet that includes only water and milk due to kidney concerns.  Unable to obtain urine specimen in office due to incontinence and advanced dementia.   Her current medications include Depakote , which is difficult for her to take due to its thick coating. Her daughter inquires about the possibility of changing her medications to liquid form to facilitate easier administration.   She has lost approximately 10-11 pounds, which her daughter believes may aid in her mobility. She has a history of knee issues but unable to have surgery due to age. Her mobility is further affected by a shuffling gait and slow gait.   She attends a day center for dementia care in [place], which provides her with social interaction and activities. Her daughter is proactive in managing her care, including dietary restrictions and  medication administration.   Review of Systems:  Review of Systems  Unable to perform ROS: Dementia    Past Medical History:  Diagnosis Date   Anterolisthesis    Grade 1 of L3 on L4 on x-ray 07/18/19   Aortic regurgitation    Seen on 2D echo 07/07/16   Cataract    CKD (chronic kidney disease), stage III (HCC)    DDD (degenerative disc disease), lumbar    Diabetes mellitus without complication (HCC)    Diastolic CHF (HCC)    Grade 2/4 with LVEF of 60-65%   Diastolic dysfunction    Grade 2/4 with LVEF of 60-65% and LVH   Gout    HLD (hyperlipidemia)    Hyperparathyroidism    Hypertension    Lumbar facet arthropathy    Memory impairment    Mitral valve regurgitation    Seen on 2D echo 07/07/16   OA (osteoarthritis)    OSA (obstructive sleep apnea)    Osteoarthritis    Polycythemia    Shingles    Sleep apnea    Thrombocytopenia    Vitamin D deficiency    Past Surgical History:  Procedure Laterality Date   LAPAROSCOPIC HYSTERECTOMY     Social History:   reports that she has never smoked. She has never used smokeless tobacco. She reports that she does not drink alcohol and does not use drugs.  Family History  Problem Relation Age of Onset   Diabetes Mother    Hypertension Father    Hypertension Brother  Hypertension Sister    Diabetes Sister     Medications: Patient's Medications  New Prescriptions   No medications on file  Previous Medications   ALLOPURINOL  (ZYLOPRIM ) 100 MG TABLET    Take 1 tablet (100 mg total) by mouth daily.   AMLODIPINE  (NORVASC ) 10 MG TABLET    Take 1 tablet (10 mg total) by mouth daily. Needs an appointment before anymore future refills.   ARIPIPRAZOLE  (ABILIFY ) 2 MG TABLET    Take 1 tablet (2 mg total) by mouth daily.   ASPIRIN  81 MG CHEWABLE TABLET    Chew 1 tablet (81 mg total) by mouth daily.   AZELASTINE  (ASTELIN ) 0.1 % NASAL SPRAY    Place 1 spray into both nostrils 2 (two) times daily. Use in each nostril as directed   BLOOD  GLUCOSE MONITORING SUPPL DEVI    1 each by Does not apply route in the morning, at noon, and at bedtime. May substitute to any manufacturer covered by patient's insurance.   DIVALPROEX  (DEPAKOTE ) 250 MG DR TABLET    Take 1 tablet (250 mg total) by mouth 2 (two) times daily.   EPINEPHRINE  (EPIPEN  2-PAK) 0.3 MG/0.3 ML IJ SOAJ INJECTION    Inject 0.3 mg into the muscle as needed for anaphylaxis.   FLUTICASONE  (FLONASE ) 50 MCG/ACT NASAL SPRAY    USE 1 SPRAY(S) IN EACH NOSTRIL TWICE DAILY AS NEEDED FOR ALLERGIES   GLIPIZIDE  (GLUCOTROL ) 5 MG TABLET    Take 1 tablet (5 mg total) by mouth daily.   GLUCOSE BLOOD TEST STRIP    1 each by Other route as needed for other. Use as instructed   GUAIFENESIN  (MUCINEX ) 600 MG 12 HR TABLET    Take 1 tablet (600 mg total) by mouth 2 (two) times daily as needed.   HYDRALAZINE  (APRESOLINE ) 25 MG TABLET    Take 1 tablet (25 mg total) by mouth 3 (three) times daily.   IPRATROPIUM (ATROVENT ) 0.06 % NASAL SPRAY    Place 2 sprays into both nostrils 4 (four) times daily.   LANCETS ULTRA THIN 30G MISC    Inject 1 each into the skin daily.   LORATADINE  (CLARITIN ) 10 MG TABLET    Take 1 tablet (10 mg total) by mouth daily as needed for allergies.   LORAZEPAM  (ATIVAN ) 0.5 MG TABLET    Take 1 tablet (0.5 mg total) by mouth 2 (two) times daily as needed. for anxiety   LOSARTAN  (COZAAR ) 50 MG TABLET    Take 1 tablet (50 mg total) by mouth daily.   NYSTATIN  CREAM (MYCOSTATIN )    Apply 1 Application topically 2 (two) times daily.   SIMVASTATIN  (ZOCOR ) 10 MG TABLET    Take 1 tablet (10 mg total) by mouth at bedtime.  Modified Medications   No medications on file  Discontinued Medications   No medications on file    Physical Exam:  Vitals:   01/26/24 1049  BP: 120/80  Pulse: 70  Temp: (!) 97.3 F (36.3 C)  SpO2: 98%  Weight: 213 lb 12.8 oz (97 kg)  Height: 5' 2 (1.575 m)   Body mass index is 39.1 kg/m. Wt Readings from Last 3 Encounters:  01/26/24 213 lb 12.8 oz (97  kg)  01/20/24 216 lb 11.4 oz (98.3 kg)  11/17/23 224 lb 6.4 oz (101.8 kg)    Physical Exam Vitals reviewed.  Constitutional:      General: She is not in acute distress. HENT:     Head: Normocephalic.  Eyes:  General:        Right eye: No discharge.        Left eye: No discharge.  Cardiovascular:     Rate and Rhythm: Normal rate and regular rhythm.     Pulses: Normal pulses.     Heart sounds: Normal heart sounds.  Abdominal:     Palpations: Abdomen is soft.     Tenderness: There is no right CVA tenderness or left CVA tenderness.  Musculoskeletal:     Cervical back: Neck supple.     Right lower leg: Edema present.     Left lower leg: Edema present.     Comments: Non pitting  Skin:    General: Skin is warm.  Neurological:     General: No focal deficit present.     Mental Status: She is alert. Mental status is at baseline.     Gait: Gait abnormal.  Psychiatric:     Comments: Aphasia, alert to self/familiar face, able to follow some commands     Labs reviewed: Basic Metabolic Panel: Recent Labs    08/03/23 1036 10/26/23 1050 01/19/24 1238 01/20/24 0634 01/21/24 0506  NA  --  139 141 139 140  K  --  5.6* 4.1 5.0 4.5  CL  --  108 108 108 113*  CO2  --  21 22 21* 22  GLUCOSE  --  120* 144* 134* 109*  BUN  --  31* 27* 28* 25*  CREATININE  --  1.66* 1.61* 1.51* 1.62*  CALCIUM  --  9.7 8.9 8.5* 8.5*  TSH 1.49 1.40  --  1.814  --    Liver Function Tests: Recent Labs    01/19/24 1238 01/20/24 0634 01/21/24 0506  AST 31 25 24   ALT 17 12 15   ALKPHOS 56 51 48  BILITOT 0.6 0.7 0.4  PROT 7.4 6.2* 6.0*  ALBUMIN 3.1* 2.7* 2.5*   No results for input(s): LIPASE, AMYLASE in the last 8760 hours. No results for input(s): AMMONIA in the last 8760 hours. CBC: Recent Labs    01/19/24 1238 01/20/24 0634 01/21/24 0506  WBC 6.2 6.1 5.2  NEUTROABS 3.7 3.6 1.7  HGB 12.1 11.6* 11.1*  HCT 38.5 38.1 33.2*  MCV 98.7 104.1* 97.6  PLT 299 262 242   Lipid  Panel: Recent Labs    02/18/23 0954 10/26/23 1050  CHOL 164 156  HDL 58 53  LDLCALC 85 83  TRIG 117 108  CHOLHDL 2.8 2.9   TSH: Recent Labs    08/03/23 1036 10/26/23 1050 01/20/24 0634  TSH 1.49 1.40 1.814   A1C: Lab Results  Component Value Date   HGBA1C 6.7 (H) 10/26/2023     Assessment/Plan 1. Urine frequency (Primary) - onset x 3 days - unable to obtain urine due to incontinence and advanced dementia - increased confusion and foul odor per daughter - encourage hydration with water - discussed cranberry supplement for prevention  - sulfamethoxazole -trimethoprim  (BACTRIM ) 400-80 MG tablet; Take 1 tablet by mouth 2 (two) times daily for 5 days.  Dispense: 10 tablet; Refill: 0  2. 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  Dispense: 180 capsule; Refill: 1  3.  Unsteady gait - see above  - shuffling gait associated with dementia - h/o right knee pain> difficulty getting in car - ambulates long distances with wheelchair - For home use only DME wheelchair cushion (seat and back) - Ambulatory referral to Occupational Therapy  4. Poor compliance with medication - daughter interested in liquid options or reducing medications if possible  - AMB Referral [facility] Care Management  Total time: 32 minutes. Greater than 50% of total time spent doing patient education regarding dementia, urine odor, incontinence, medication compliance, shuffling gait, and dementia including symptom/medication management.    Next appt: Visit date not found  Michelle Rowe  [facility] [phone]      [1]   Allergies Allergen Reactions   Augmentin [Amoxicillin-Pot Clavulanate] Rash    Critically high - Onset 07/13/2019   Amoxicillin     Did it involve swelling of the face/tongue/throat, SOB, or low BP? Y Did it involve sudden or severe rash/hives, skin peeling, or any reaction on the inside of your mouth or nose? Y (Rash, Swelling, SOB) Did you need to seek medical attention at a hospital or doctor's office? Y When did it last happen?  Yesterday     If all above answers are NO, may proceed with cephalosporin use.    Shellfish Allergy 

## 2024-01-26 NOTE — Telephone Encounter (Signed)
 Noted

## 2024-01-26 NOTE — Telephone Encounter (Signed)
 FYI Only or Action Required?: FYI only for provider: appointment scheduled on 01/26/2024.  Patient was last seen in primary care on 11/17/2023 by Ngetich, Roxan BROCKS, NP.  Called Nurse Triage reporting Urinary Frequency.  Symptoms began several days ago.  Interventions attempted: Nothing.  Symptoms are: stable.  Triage Disposition: See Physician Within 24 Hours  Patient/caregiver understands and will follow disposition?: Yes   Reason for Disposition  Bad or foul-smelling urine  Answer Assessment - Initial Assessment Questions Patients daughter Sharlet [facility]) calling in today requesting appointment any provider in office for patients urinary symptoms.   1. SYMPTOM: What's the main symptom you're concerned about? (e.g., frequency, incontinence)     Urinary symptoms , urine foul odor 2. ONSET: When did the  urinary odor   start?     Urine odor started 2 days ago it was different now gotten stronger whole room: unbearable  3. PAIN: Is there any pain? If Yes, ask: How bad is it? (Scale: 1-10; mild, moderate, severe)     Unable to assess , has dementia hx per daughter can't tell if its hurting  4. CAUSE: What do you think is causing the symptoms?     Daughter is concerned for UTI 5. OTHER SYMPTOMS: Do you have any other symptoms? (e.g., blood in urine, fever, flank pain, pain with urination)     Denies blood visible in urine , fever,increased weakness  Protocols used: Urinary Symptoms-A-AH Copied from CRM #[id number]. Topic: Clinical - Red Word Triage >> Jan 26, 2024  8:27 AM Miquel SAILOR wrote: Red Word that prompted transfer to Nurse Triage: PT daughter Sharlet stated PT was Admitted 12/12 discharged 12/13 reason: fall. Now she has Possible UTI as of 12/18. Strong smell from urine/Also has dementia and Alzheimer's. daughter is care giver.

## 2024-01-26 NOTE — Telephone Encounter (Signed)
 Patient came in to see provider and had form for Adult Day Care that she emailed to me that need to be completed and faxed back when done. Please advise given to Michelle Rowe in CI

## 2024-01-26 NOTE — Telephone Encounter (Signed)
 Form has been faxed.

## 2024-01-26 NOTE — Patient Instructions (Signed)
 Consider cranberry juice or cranberry chew/supplement  Referral made to pharmacist to discuss medications  Will prescribe antibiotic for suspected UTI> please complete all of it

## 2024-02-06 ENCOUNTER — Ambulatory Visit: Payer: Self-pay

## 2024-02-06 ENCOUNTER — Other Ambulatory Visit: Payer: Self-pay

## 2024-02-06 ENCOUNTER — Ambulatory Visit: Admitting: Family

## 2024-02-06 ENCOUNTER — Inpatient Hospital Stay (HOSPITAL_BASED_OUTPATIENT_CLINIC_OR_DEPARTMENT_OTHER)
Admission: EM | Admit: 2024-02-06 | Discharge: 2024-02-09 | DRG: 683 | Disposition: A | Discharge status: Home / Self Care | Attending: Internal Medicine | Admitting: Internal Medicine

## 2024-02-06 ENCOUNTER — Encounter (HOSPITAL_BASED_OUTPATIENT_CLINIC_OR_DEPARTMENT_OTHER): Payer: Self-pay | Admitting: Emergency Medicine

## 2024-02-06 ENCOUNTER — Emergency Department (HOSPITAL_BASED_OUTPATIENT_CLINIC_OR_DEPARTMENT_OTHER)

## 2024-02-06 ENCOUNTER — Encounter: Payer: Self-pay | Admitting: Family

## 2024-02-06 ENCOUNTER — Emergency Department (HOSPITAL_BASED_OUTPATIENT_CLINIC_OR_DEPARTMENT_OTHER): Admitting: Radiology

## 2024-02-06 VITALS — BP 124/68 | HR 56 | Temp 97.2°F | Ht 62.0 in

## 2024-02-06 DIAGNOSIS — I10 Essential (primary) hypertension: Secondary | ICD-10-CM | POA: Diagnosis present

## 2024-02-06 DIAGNOSIS — Z7982 Long term (current) use of aspirin: Secondary | ICD-10-CM

## 2024-02-06 DIAGNOSIS — R2681 Unsteadiness on feet: Secondary | ICD-10-CM | POA: Diagnosis not present

## 2024-02-06 DIAGNOSIS — E860 Dehydration: Secondary | ICD-10-CM | POA: Diagnosis present

## 2024-02-06 DIAGNOSIS — G301 Alzheimer's disease with late onset: Secondary | ICD-10-CM | POA: Diagnosis present

## 2024-02-06 DIAGNOSIS — G4733 Obstructive sleep apnea (adult) (pediatric): Secondary | ICD-10-CM | POA: Diagnosis present

## 2024-02-06 DIAGNOSIS — K521 Toxic gastroenteritis and colitis: Secondary | ICD-10-CM | POA: Diagnosis present

## 2024-02-06 DIAGNOSIS — N184 Chronic kidney disease, stage 4 (severe): Secondary | ICD-10-CM | POA: Diagnosis present

## 2024-02-06 DIAGNOSIS — N179 Acute kidney failure, unspecified: Secondary | ICD-10-CM | POA: Diagnosis present

## 2024-02-06 DIAGNOSIS — R8290 Unspecified abnormal findings in urine: Secondary | ICD-10-CM | POA: Diagnosis present

## 2024-02-06 DIAGNOSIS — F03918 Unspecified dementia, unspecified severity, with other behavioral disturbance: Secondary | ICD-10-CM

## 2024-02-06 DIAGNOSIS — R197 Diarrhea, unspecified: Secondary | ICD-10-CM | POA: Diagnosis not present

## 2024-02-06 DIAGNOSIS — E6609 Other obesity due to excess calories: Secondary | ICD-10-CM | POA: Diagnosis present

## 2024-02-06 DIAGNOSIS — Z66 Do not resuscitate: Secondary | ICD-10-CM | POA: Diagnosis present

## 2024-02-06 DIAGNOSIS — Z880 Allergy status to penicillin: Secondary | ICD-10-CM

## 2024-02-06 DIAGNOSIS — R350 Frequency of micturition: Secondary | ICD-10-CM

## 2024-02-06 DIAGNOSIS — Z79899 Other long term (current) drug therapy: Secondary | ICD-10-CM

## 2024-02-06 DIAGNOSIS — E785 Hyperlipidemia, unspecified: Secondary | ICD-10-CM | POA: Diagnosis present

## 2024-02-06 DIAGNOSIS — B9620 Unspecified Escherichia coli [E. coli] as the cause of diseases classified elsewhere: Secondary | ICD-10-CM | POA: Diagnosis present

## 2024-02-06 DIAGNOSIS — Z6839 Body mass index (BMI) 39.0-39.9, adult: Secondary | ICD-10-CM

## 2024-02-06 DIAGNOSIS — Z8249 Family history of ischemic heart disease and other diseases of the circulatory system: Secondary | ICD-10-CM

## 2024-02-06 DIAGNOSIS — N390 Urinary tract infection, site not specified: Principal | ICD-10-CM | POA: Diagnosis present

## 2024-02-06 DIAGNOSIS — M109 Gout, unspecified: Secondary | ICD-10-CM | POA: Diagnosis present

## 2024-02-06 DIAGNOSIS — I130 Hypertensive heart and chronic kidney disease with heart failure and stage 1 through stage 4 chronic kidney disease, or unspecified chronic kidney disease: Secondary | ICD-10-CM | POA: Diagnosis present

## 2024-02-06 DIAGNOSIS — E1122 Type 2 diabetes mellitus with diabetic chronic kidney disease: Secondary | ICD-10-CM | POA: Diagnosis present

## 2024-02-06 DIAGNOSIS — Z881 Allergy status to other antibiotic agents status: Secondary | ICD-10-CM

## 2024-02-06 DIAGNOSIS — F0280 Dementia in other diseases classified elsewhere without behavioral disturbance: Secondary | ICD-10-CM | POA: Diagnosis present

## 2024-02-06 DIAGNOSIS — E66812 Obesity, class 2: Secondary | ICD-10-CM | POA: Diagnosis present

## 2024-02-06 DIAGNOSIS — N170 Acute kidney failure with tubular necrosis: Principal | ICD-10-CM | POA: Diagnosis present

## 2024-02-06 DIAGNOSIS — Z833 Family history of diabetes mellitus: Secondary | ICD-10-CM

## 2024-02-06 DIAGNOSIS — F05 Delirium due to known physiological condition: Secondary | ICD-10-CM | POA: Diagnosis present

## 2024-02-06 DIAGNOSIS — R410 Disorientation, unspecified: Secondary | ICD-10-CM

## 2024-02-06 DIAGNOSIS — N1832 Chronic kidney disease, stage 3b: Secondary | ICD-10-CM | POA: Diagnosis present

## 2024-02-06 DIAGNOSIS — T3695XA Adverse effect of unspecified systemic antibiotic, initial encounter: Secondary | ICD-10-CM | POA: Diagnosis present

## 2024-02-06 DIAGNOSIS — Z91013 Allergy to seafood: Secondary | ICD-10-CM

## 2024-02-06 DIAGNOSIS — Z87440 Personal history of urinary (tract) infections: Secondary | ICD-10-CM

## 2024-02-06 DIAGNOSIS — R531 Weakness: Secondary | ICD-10-CM

## 2024-02-06 DIAGNOSIS — I5032 Chronic diastolic (congestive) heart failure: Secondary | ICD-10-CM | POA: Diagnosis present

## 2024-02-06 DIAGNOSIS — E875 Hyperkalemia: Secondary | ICD-10-CM | POA: Diagnosis present

## 2024-02-06 LAB — POC URINALSYSI DIPSTICK (AUTOMATED)
Bilirubin, UA: POSITIVE
Glucose, UA: NEGATIVE
Ketones, UA: NEGATIVE
Nitrite, UA: NEGATIVE
Protein, UA: POSITIVE — AB
Spec Grav, UA: 1.01
Urobilinogen, UA: 4 U/dL — AB
pH, UA: 8

## 2024-02-06 LAB — CBC
HCT: 37.9 % (ref 36.0–46.0)
Hemoglobin: 12.2 g/dL (ref 12.0–15.0)
MCH: 31.4 pg (ref 26.0–34.0)
MCHC: 32.2 g/dL (ref 30.0–36.0)
MCV: 97.4 fL (ref 80.0–100.0)
Platelets: 255 K/uL (ref 150–400)
RBC: 3.89 MIL/uL (ref 3.87–5.11)
RDW: 13.6 % (ref 11.5–15.5)
WBC: 5.1 K/uL (ref 4.0–10.5)
nRBC: 0 % (ref 0.0–0.2)

## 2024-02-06 LAB — URINALYSIS, W/ REFLEX TO CULTURE (INFECTION SUSPECTED)
Bilirubin Urine: NEGATIVE
Glucose, UA: NEGATIVE mg/dL
Ketones, ur: NEGATIVE mg/dL
Nitrite: POSITIVE — AB
Protein, ur: 30 mg/dL — AB
Specific Gravity, Urine: 1.01 (ref 1.005–1.030)
WBC, UA: >50 WBC/hpf (ref 0–5)
pH: 7 (ref 5.0–8.0)

## 2024-02-06 LAB — COMPREHENSIVE METABOLIC PANEL WITH GFR
ALT: 14 U/L (ref 0–44)
AST: 23 U/L (ref 15–41)
Albumin: 4 g/dL (ref 3.5–5.0)
Alkaline Phosphatase: 75 U/L (ref 38–126)
Anion gap: 12 (ref 5–15)
BUN: 41 mg/dL — ABNORMAL HIGH (ref 8–23)
CO2: 21 mmol/L — ABNORMAL LOW (ref 22–32)
Calcium: 9.9 mg/dL (ref 8.9–10.3)
Chloride: 103 mmol/L (ref 98–111)
Creatinine, Ser: 2.86 mg/dL — ABNORMAL HIGH (ref 0.44–1.00)
GFR, Estimated: 16 mL/min — ABNORMAL LOW
Glucose, Bld: 89 mg/dL (ref 70–99)
Potassium: 5.9 mmol/L — ABNORMAL HIGH (ref 3.5–5.1)
Sodium: 136 mmol/L (ref 135–145)
Total Bilirubin: 0.5 mg/dL (ref 0.0–1.2)
Total Protein: 7.8 g/dL (ref 6.5–8.1)

## 2024-02-06 LAB — LACTIC ACID, PLASMA: Lactic Acid, Venous: 1.3 mmol/L (ref 0.5–1.9)

## 2024-02-06 LAB — CBG MONITORING, ED: Glucose-Capillary: 143 mg/dL — ABNORMAL HIGH (ref 70–99)

## 2024-02-06 LAB — TROPONIN T, HIGH SENSITIVITY
Troponin T High Sensitivity: 21 ng/L — ABNORMAL HIGH (ref 0–19)
Troponin T High Sensitivity: 16 ng/L (ref 0–19)

## 2024-02-06 LAB — PRO BRAIN NATRIURETIC PEPTIDE: Pro Brain Natriuretic Peptide: 91.4 pg/mL

## 2024-02-06 LAB — LIPASE, BLOOD: Lipase: 55 U/L — ABNORMAL HIGH (ref 11–51)

## 2024-02-06 MED ORDER — GLIPIZIDE 5 MG PO TABS
5.0000 mg | ORAL_TABLET | Freq: Every day | ORAL | Status: DC
  Administered 2024-02-06 – 2024-02-07 (×2): 5 mg via ORAL

## 2024-02-06 MED ORDER — SODIUM CHLORIDE 0.9 % IV BOLUS
1000.0000 mL | Freq: Once | INTRAVENOUS | Status: AC
  Administered 2024-02-06: 1000 mL via INTRAVENOUS

## 2024-02-06 MED ORDER — DIVALPROEX SODIUM 125 MG PO CSDR
250.0000 mg | DELAYED_RELEASE_CAPSULE | Freq: Two times a day (BID) | ORAL | Status: DC
  Administered 2024-02-07 – 2024-02-09 (×6): 250 mg via ORAL
  Filled 2024-02-06 (×4): qty 2

## 2024-02-06 MED ORDER — HYDRALAZINE HCL 25 MG PO TABS
25.0000 mg | ORAL_TABLET | Freq: Three times a day (TID) | ORAL | Status: DC
  Administered 2024-02-07: 25 mg via ORAL
  Filled 2024-02-06: qty 1

## 2024-02-06 MED ORDER — SODIUM CHLORIDE 0.9 % IV SOLN
1.0000 g | Freq: Once | INTRAVENOUS | Status: AC
  Administered 2024-02-06: 1 g via INTRAVENOUS

## 2024-02-06 MED ORDER — VANCOMYCIN HCL IN DEXTROSE 1-5 GM/200ML-% IV SOLN
1000.0000 mg | Freq: Once | INTRAVENOUS | Status: AC
  Administered 2024-02-06: 1000 mg via INTRAVENOUS
  Filled 2024-02-06: qty 200

## 2024-02-06 MED ORDER — LORAZEPAM 0.5 MG PO TABS
0.5000 mg | ORAL_TABLET | Freq: Two times a day (BID) | ORAL | Status: DC | PRN
  Administered 2024-02-07: 0.5 mg via ORAL
  Filled 2024-02-06: qty 1

## 2024-02-06 MED ORDER — AMLODIPINE BESYLATE 5 MG PO TABS
10.0000 mg | ORAL_TABLET | Freq: Every day | ORAL | Status: DC
  Administered 2024-02-07: 10 mg via ORAL
  Filled 2024-02-06 (×2): qty 2

## 2024-02-06 MED ORDER — ARIPIPRAZOLE 2 MG PO TABS
2.0000 mg | ORAL_TABLET | Freq: Every day | ORAL | Status: DC
  Administered 2024-02-06 – 2024-02-08 (×3): 2 mg via ORAL
  Filled 2024-02-06 (×4): qty 1

## 2024-02-06 MED ORDER — SIMVASTATIN 10 MG PO TABS
10.0000 mg | ORAL_TABLET | Freq: Every day | ORAL | Status: DC
  Administered 2024-02-07 – 2024-02-08 (×3): 10 mg via ORAL
  Filled 2024-02-06 (×3): qty 1

## 2024-02-06 MED ORDER — LOSARTAN POTASSIUM 25 MG PO TABS
50.0000 mg | ORAL_TABLET | Freq: Every day | ORAL | Status: DC
  Administered 2024-02-06: 50 mg via ORAL
  Filled 2024-02-06: qty 2

## 2024-02-06 MED ORDER — ASPIRIN 81 MG PO CHEW
81.0000 mg | CHEWABLE_TABLET | Freq: Every day | ORAL | Status: DC
  Administered 2024-02-06 – 2024-02-09 (×4): 81 mg via ORAL
  Filled 2024-02-06 (×4): qty 1

## 2024-02-06 MED ORDER — ALLOPURINOL 100 MG PO TABS
100.0000 mg | ORAL_TABLET | Freq: Every day | ORAL | Status: DC
  Administered 2024-02-06 – 2024-02-09 (×4): 100 mg via ORAL
  Filled 2024-02-06 (×4): qty 1

## 2024-02-06 NOTE — ED Provider Triage Note (Signed)
 Emergency Medicine Provider Triage Evaluation Note  Michelle Rowe , a 83 y.o. female  was evaluated in triage.  Pt complains of recently dc'd for UTI and AMS. Worsening symptoms today. POA concerned for dehydration. Poor PO intake. Concerned for diarrhea, a little bit of chest pain, generalized weakness. Also with worsening BL LE swelling. PCP sent to ED.   Review of Systems  Positive:  Negative:  Physical Exam  There were no vitals taken for this visit. Gen:   Awake, no distress   Resp:  Normal effort  MSK:   Moves extremities without difficulty  Other:    Medical Decision Making  Medically screening exam initiated at 3:42 PM.  Appropriate orders placed.  Michelle Rowe was informed that the remainder of the evaluation will be completed by another provider, this initial triage assessment does not replace that evaluation, and the importance of remaining in the ED until their evaluation is complete.     Michelle Rowe, [place] 02/06/24 1544

## 2024-02-06 NOTE — ED Provider Notes (Addendum)
 " [place] EMERGENCY DEPARTMENT AT [facility] Provider Note   CSN: [id number] Arrival date & time: 02/06/24  1524     Patient presents with: No chief complaint on file.   Michelle Rowe is a 83 y.o. female.  With a history of dementia, type 2 diabetes and recurrent UTIs who presents to the ED for suspected UTI.  Patient has suffered from ongoing weakness and generalized confusion at home.  She was seen in the ED on December 11 after a fall.  Her traumatic workup was negative at that time and she was subsequently admitted for acute kidney injury.  She was discharged to the care of her daughter at home.  It sounds like she did well for couple days but then became increasingly confused and agitated.  No recurrent falls but she is very unsteady on her feet.  Seen by PCP office recently completed 5-day course of Bactrim  after being seen on the 18th.  PCP urinalysis positive for UTI today and she was directed to the ED for further evaluation.  Daughter also voiced concern for dehydration and diarrhea.  No fevers chills or respiratory symptoms at home.  Patient unable to contribute additional history secondary to dementia.    HPI     Prior to Admission medications  Medication Sig Start Date End Date Taking? Authorizing Provider  allopurinol  (ZYLOPRIM ) 100 MG tablet Take 1 tablet (100 mg total) by mouth daily. 10/26/23   Ngetich, [name] C, NP  amLODipine  (NORVASC ) 10 MG tablet Take 1 tablet (10 mg total) by mouth daily. Needs an appointment before anymore future refills. 10/26/23   Ngetich, [name] C, NP  ARIPiprazole  (ABILIFY ) 2 MG tablet Take 1 tablet (2 mg total) by mouth daily. 01/12/24   Ngetich, [name] C, NP  aspirin  81 MG chewable tablet Chew 1 tablet (81 mg total) by mouth daily. 10/26/23   Ngetich, [name] C, NP  azelastine  (ASTELIN ) 0.1 % nasal spray Place 1 spray into both nostrils 2 (two) times daily. Use in each nostril as directed 10/26/23   Ngetich, [name] C, NP  Blood Glucose  Monitoring Suppl DEVI 1 each by Does not apply route in the morning, at noon, and at bedtime. May substitute to any manufacturer covered by patient's insurance. 02/18/23   Ngetich, [name] C, NP  divalproex  (DEPAKOTE  SPRINKLE) 125 MG capsule Take 2 capsules (250 mg total) by mouth 2 (two) times daily. 01/26/24   Fargo, Amy E, NP  EPINEPHrine  (EPIPEN  2-PAK) 0.3 mg/0.3 mL IJ SOAJ injection Inject 0.3 mg into the muscle as needed for anaphylaxis. 02/18/23   Ngetich, [name] C, NP  fluticasone  (FLONASE ) 50 MCG/ACT nasal spray USE 1 SPRAY(S) IN EACH NOSTRIL TWICE DAILY AS NEEDED FOR ALLERGIES 10/26/23   Ngetich, [name] C, NP  glipiZIDE  (GLUCOTROL ) 5 MG tablet Take 1 tablet (5 mg total) by mouth daily. 10/26/23   Ngetich, [name] C, NP  glucose blood test strip 1 each by Other route as needed for other. Use as instructed 10/26/23   Ngetich, [name] C, NP  guaiFENesin  (MUCINEX ) 600 MG 12 hr tablet Take 1 tablet (600 mg total) by mouth 2 (two) times daily as needed. 10/26/23   Ngetich, [name] C, NP  hydrALAZINE  (APRESOLINE ) 25 MG tablet Take 1 tablet (25 mg total) by mouth 3 (three) times daily. 10/26/23   Ngetich, [name] C, NP  Lancets Ultra Thin 30G MISC Inject 1 each into the skin daily. 10/26/23   Ngetich, [name] C, NP  loratadine  (CLARITIN ) 10 MG tablet Take  1 tablet (10 mg total) by mouth daily as needed for allergies. 10/26/23   Ngetich, [name] C, NP  LORazepam  (ATIVAN ) 0.5 MG tablet Take 1 tablet (0.5 mg total) by mouth 2 (two) times daily as needed. for anxiety 10/26/23   Ngetich, [name] C, NP  losartan  (COZAAR ) 50 MG tablet Take 1 tablet (50 mg total) by mouth daily. 10/26/23   Ngetich, [name] C, NP  simvastatin  (ZOCOR ) 10 MG tablet Take 1 tablet (10 mg total) by mouth at bedtime. 10/26/23   Ngetich, [name] C, NP    Allergies: Augmentin [amoxicillin-pot clavulanate], Amoxicillin, and Shellfish allergy     Review of Systems  Updated Vital Signs BP 112/62 (BP Location: Left Arm)   Pulse (!) 58   Temp 97.6 F (36.4 C)  (Oral)   Resp 18   SpO2 100%   Physical Exam Vitals and nursing note reviewed.  HENT:     Head: Normocephalic and atraumatic.  Eyes:     Pupils: Pupils are equal, round, and reactive to light.  Cardiovascular:     Rate and Rhythm: Normal rate and regular rhythm.  Pulmonary:     Effort: Pulmonary effort is normal.     Breath sounds: Normal breath sounds.  Abdominal:     Palpations: Abdomen is soft.     Tenderness: There is no abdominal tenderness.  Musculoskeletal:     Comments: No midline tenderness step-off deformity back No sacral wound  Skin:    General: Skin is warm and dry.  Neurological:     Mental Status: She is alert.     Comments: Oriented to self only, baseline Symmetric motor strength bilateral upper and lower extremities with sensation tact light touch throughout  Psychiatric:        Mood and Affect: Mood normal.     (all labs ordered are listed, but only abnormal results are displayed) Labs Reviewed  COMPREHENSIVE METABOLIC PANEL WITH GFR - Abnormal; Notable for the following components:      Result Value   Potassium 5.9 (*)    CO2 21 (*)    BUN 41 (*)    Creatinine, Ser 2.86 (*)    GFR, Estimated 16 (*)    All other components within normal limits  LIPASE, BLOOD - Abnormal; Notable for the following components:   Lipase 55 (*)    All other components within normal limits  URINALYSIS, W/ REFLEX TO CULTURE (INFECTION SUSPECTED) - Abnormal; Notable for the following components:   Hgb urine dipstick TRACE (*)    Protein, ur 30 (*)    Nitrite POSITIVE (*)    Leukocytes,Ua LARGE (*)    Bacteria, UA RARE (*)    All other components within normal limits  TROPONIN T, HIGH SENSITIVITY - Abnormal; Notable for the following components:   Troponin T High Sensitivity 21 (*)    All other components within normal limits  CULTURE, BLOOD (ROUTINE X 2)  CULTURE, BLOOD (ROUTINE X 2)  URINE CULTURE  CBC  PRO BRAIN NATRIURETIC PEPTIDE  LACTIC ACID, PLASMA   TROPONIN T, HIGH SENSITIVITY    EKG: None  Radiology: DG Chest Port 1 View Result Date: 02/06/2024 EXAM: 1 VIEW(S) XRAY OF THE CHEST 02/06/2024 05:18:29 PM COMPARISON: 07/31/2023 CLINICAL HISTORY: Chest pain FINDINGS: LUNGS AND PLEURA: Low lung volumes. No focal pulmonary opacity. No pleural effusion. No pneumothorax. HEART AND MEDIASTINUM: No acute abnormality of the cardiac and mediastinal silhouettes. BONES AND SOFT TISSUES: Degenerative changes of shoulders. No acute osseous abnormality. IMPRESSION: 1. Low lung  volumes. No acute cardiopulmonary abnormality. Electronically signed by: Morgane Naveau MD 02/06/2024 07:30 PM EST RP Workstation: [id number]     Procedures   Medications Ordered in the ED  allopurinol  (ZYLOPRIM ) tablet 100 mg (100 mg Oral Given 02/06/24 1930)  amLODipine  (NORVASC ) tablet 10 mg (0 mg Oral Hold 02/06/24 1949)  ARIPiprazole  (ABILIFY ) tablet 2 mg (2 mg Oral Given 02/06/24 1929)  aspirin  chewable tablet 81 mg (81 mg Oral Given 02/06/24 1930)  divalproex  (DEPAKOTE  SPRINKLE) capsule 250 mg (has no administration in time range)  glipiZIDE  (GLUCOTROL ) tablet 5 mg (has no administration in time range)  hydrALAZINE  (APRESOLINE ) tablet 25 mg (has no administration in time range)  LORazepam  (ATIVAN ) tablet 0.5 mg (has no administration in time range)  simvastatin  (ZOCOR ) tablet 10 mg (has no administration in time range)  aztreonam  (AZACTAM ) 1 g in sodium chloride  0.9 % 100 mL IVPB (0 g Intravenous Stopped 02/06/24 1728)  vancomycin  (VANCOCIN ) IVPB 1000 mg/200 mL premix (0 mg Intravenous Stopped 02/06/24 1833)  sodium chloride  0.9 % bolus 1,000 mL (0 mLs Intravenous Stopped 02/06/24 1743)    Clinical Course as of 02/06/24 1956  Mon Feb 06, 2024  1931 Labs consistent with UTI.  Acute kidney injury.  Hyperkalemia.  Provided IV fluid bolus and antibiotics here.  Will send urine for culture.  Will admit to medicine for treatment refractory UTI altered mental status [MP]   1956 Discussed Dr. Charlton who accepts patient for admission [MP]    Clinical Course User Index [MP] Pamella Ozell LABOR, DO                                 Medical Decision Making 83 year old female with history as above presenting to the ED given concern for UTI weakness diarrhea dehydration.  UA consistent with UTI at PCP office and she was directed here for further evaluation.  Recently completed course of Bactrim  at home.  Given story consistent with refractory UTI despite outpatient treatment persistent weakness at home patient may require admission.  No repeated falls.  Will start on antibiotics to cover for UTI, provide IV fluids and obtain laboratory workup along with another chest x-ray.  Amount and/or Complexity of Data Reviewed Labs: ordered.  Risk OTC drugs. Prescription drug management. Decision regarding hospitalization.        Final diagnoses:  Lower urinary tract infectious disease  Confusion  AKI (acute kidney injury)  Hyperkalemia    ED Discharge Orders     None          Pamella Ozell LABOR, DO 02/06/24 1932    Pamella Ozell LABOR, DO 02/06/24 1956    Pamella Ozell LABOR, DO 02/06/24 1956  "

## 2024-02-06 NOTE — Progress Notes (Signed)
 "  Provider: Lanita Stammen FNP-C  Wendal Wilkie, Roxan BROCKS, NP  Patient Care Team: Jo-Ann Johanning, Roxan BROCKS, NP as PCP - General (Family Medicine)  Extended Emergency Contact Information Primary Emergency Contact: Brunkow,Pamela Home Phone: [phone] Relation: Daughter Secondary Emergency Contact: [facility] Phone: [phone] Mobile Phone: [phone] Relation: Daughter  Code Status:  Full Code  Goals of care: Advanced Directive information    01/19/2024   11:55 PM  Advanced Directives  Does Patient Have a Medical Advance Directive? Yes  Type of Advance Directive Healthcare Power of Attorney  Does patient want to make changes to medical advance directive? No - Patient declined     Chief Complaint  Patient presents with   Urine     Odor. Patient saw Amy 01/26/24 and was given a rx but its not helping    Diarrhea    Constant    History of Present Illness   Michelle Rowe is an 83 year old female with dementia who presents with generalized weakness, diarrhea, and increased confusion.  She was recently admitted to the hospital on December 11th due to a fall and was discharged the following Saturday. During her hospital stay, she experienced irritability and required sedation due to prolonged wakefulness. Upon discharge, her daughter noticed a foul smell and cloudiness in her urine, leading to a visit to urgent care where she was prescribed Bactrim  for a suspected UTI. The Bactrim  was taken for five days but did not alleviate the symptoms, as the urine continued to have a foul odor and cloudiness.  She has been experiencing diarrhea since Friday, with episodes continuing intermittently. She has also been rubbing her stomach. There is no report of blood in the urine, but she wears briefs, making it difficult to observe directly.  She has been experiencing increased confusion and irritability, with difficulty sleeping at night and staying awake during the day. Her daughter notes  that she has not been eating well, which is unusual for her, as she typically eats all her food.  She experiences shortness of breath when walking and noticeable swelling in her legs. Her daughter reports that she was previously dehydrated and required IV fluids during her hospital stay. She is not drinking enough water at home, which is concerning given her current symptoms.         Past Medical History:  Diagnosis Date   Anterolisthesis    Grade 1 of L3 on L4 on x-ray 07/18/19   Aortic regurgitation    Seen on 2D echo 07/07/16   Cataract    CKD (chronic kidney disease), stage III (HCC)    DDD (degenerative disc disease), lumbar    Diabetes mellitus without complication (HCC)    Diastolic CHF (HCC)    Grade 2/4 with LVEF of 60-65%   Diastolic dysfunction    Grade 2/4 with LVEF of 60-65% and LVH   Gout    HLD (hyperlipidemia)    Hyperparathyroidism    Hypertension    Lumbar facet arthropathy    Memory impairment    Mitral valve regurgitation    Seen on 2D echo 07/07/16   OA (osteoarthritis)    OSA (obstructive sleep apnea)    Osteoarthritis    Polycythemia    Shingles    Sleep apnea    Thrombocytopenia    Vitamin D deficiency    Past Surgical History:  Procedure Laterality Date   LAPAROSCOPIC HYSTERECTOMY      Allergies[1]  Outpatient Encounter Medications as of 02/06/2024  Medication Sig  allopurinol  (ZYLOPRIM ) 100 MG tablet Take 1 tablet (100 mg total) by mouth daily.   amLODipine  (NORVASC ) 10 MG tablet Take 1 tablet (10 mg total) by mouth daily. Needs an appointment before anymore future refills.   ARIPiprazole  (ABILIFY ) 2 MG tablet Take 1 tablet (2 mg total) by mouth daily.   aspirin  81 MG chewable tablet Chew 1 tablet (81 mg total) by mouth daily.   azelastine  (ASTELIN ) 0.1 % nasal spray Place 1 spray into both nostrils 2 (two) times daily. Use in each nostril as directed   Blood Glucose Monitoring Suppl DEVI 1 each by Does not apply route in the morning, at  noon, and at bedtime. May substitute to any manufacturer covered by patient's insurance.   divalproex  (DEPAKOTE  SPRINKLE) 125 MG capsule Take 2 capsules (250 mg total) by mouth 2 (two) times daily.   EPINEPHrine  (EPIPEN  2-PAK) 0.3 mg/0.3 mL IJ SOAJ injection Inject 0.3 mg into the muscle as needed for anaphylaxis.   fluticasone  (FLONASE ) 50 MCG/ACT nasal spray USE 1 SPRAY(S) IN EACH NOSTRIL TWICE DAILY AS NEEDED FOR ALLERGIES   glipiZIDE  (GLUCOTROL ) 5 MG tablet Take 1 tablet (5 mg total) by mouth daily.   glucose blood test strip 1 each by Other route as needed for other. Use as instructed   guaiFENesin  (MUCINEX ) 600 MG 12 hr tablet Take 1 tablet (600 mg total) by mouth 2 (two) times daily as needed.   hydrALAZINE  (APRESOLINE ) 25 MG tablet Take 1 tablet (25 mg total) by mouth 3 (three) times daily.   Lancets Ultra Thin 30G MISC Inject 1 each into the skin daily.   loratadine  (CLARITIN ) 10 MG tablet Take 1 tablet (10 mg total) by mouth daily as needed for allergies.   LORazepam  (ATIVAN ) 0.5 MG tablet Take 1 tablet (0.5 mg total) by mouth 2 (two) times daily as needed. for anxiety   losartan  (COZAAR ) 50 MG tablet Take 1 tablet (50 mg total) by mouth daily.   simvastatin  (ZOCOR ) 10 MG tablet Take 1 tablet (10 mg total) by mouth at bedtime.   No facility-administered encounter medications on file as of 02/06/2024.    Review of Systems  Unable to perform ROS: Dementia (HPI provided by daughter)    Immunization History  Administered Date(s) Administered   Fluad Quad(high Dose 65+) 11/08/2019   Fluad Trivalent(High Dose 65+) 02/18/2023, 10/05/2023   INFLUENZA, HIGH DOSE SEASONAL PF 10/03/2021   Influenza-Unspecified 10/31/2018, 10/16/2020   Moderna Sars-Covid-2 Vaccination 04/25/2019, 05/23/2019, 12/25/2019   Pneumococcal Conjugate-13 05/06/2015   Pneumococcal Polysaccharide-23 10/30/2013   Tdap 12/31/2013   Zoster Recombinant(Shingrix) 10/16/2020, 03/28/2021   Pertinent  Health Maintenance  Due  Topic Date Due   FOOT EXAM  Never done   OPHTHALMOLOGY EXAM  03/12/2021   HEMOGLOBIN A1C  04/24/2024   Influenza Vaccine  Completed   Bone Density Scan  Completed      08/03/2023   10:17 AM 08/03/2023   10:20 AM 08/03/2023   10:28 AM 10/26/2023    9:55 AM 01/26/2024   12:37 PM  Fall Risk  Falls in the past year? 0 0 0 0 1  Was there an injury with Fall? 0  1  1  0  0  Fall Risk Category Calculator 0 1 1 0 1  Patient at Risk for Falls Due to No Fall Risks   No Fall Risks Impaired balance/gait  Fall risk Follow up Falls evaluation completed   Falls evaluation completed Falls evaluation completed     Data saved with  a previous flowsheet row definition   Functional Status Survey:    Vitals:   02/06/24 1413  BP: 124/68  Pulse: (!) 56  Temp: (!) 97.2 F (36.2 C)  TempSrc: Temporal  SpO2: 100%  Height: 5' 2 (1.575 m)   Body mass index is 39.1 kg/m. Physical Exam Vitals reviewed.  Constitutional:      General: She is sleeping. She is not in acute distress.    Appearance: Normal appearance. She is normal weight. She is not ill-appearing or diaphoretic.     Comments: Slept throughout the visit   HENT:     Head: Normocephalic.     Nose: Nose normal. No congestion or rhinorrhea.     Mouth/Throat:     Mouth: Mucous membranes are moist.     Pharynx: Oropharynx is clear. No oropharyngeal exudate or posterior oropharyngeal erythema.  Eyes:     General: No scleral icterus.       Right eye: No discharge.        Left eye: No discharge.     Extraocular Movements: Extraocular movements intact.     Conjunctiva/sclera: Conjunctivae normal.     Pupils: Pupils are equal, round, and reactive to light.  Neck:     Vascular: No carotid bruit.  Cardiovascular:     Rate and Rhythm: Normal rate and regular rhythm.     Pulses: Normal pulses.     Heart sounds: Normal heart sounds. No murmur heard.    No friction rub. No gallop.  Pulmonary:     Effort: Pulmonary effort is normal. No  respiratory distress.     Breath sounds: Normal breath sounds. No wheezing, rhonchi or rales.  Chest:     Chest wall: No tenderness.  Abdominal:     General: Bowel sounds are normal. There is no distension.     Palpations: Abdomen is soft. There is no mass.     Tenderness: There is no abdominal tenderness. There is no right CVA tenderness, left CVA tenderness, guarding or rebound.  Musculoskeletal:        General: No swelling or tenderness. Normal range of motion.     Cervical back: Normal range of motion. No rigidity or tenderness.     Right lower leg: Edema present.     Left lower leg: Edema present.  Lymphadenopathy:     Cervical: No cervical adenopathy.  Skin:    General: Skin is warm and dry.     Coloration: Skin is not pale.     Findings: No bruising, erythema, lesion or rash.  Neurological:     Mental Status: She is oriented to person, place, and time.     Cranial Nerves: No cranial nerve deficit.     Sensory: No sensory deficit.     Motor: No weakness.     Coordination: Coordination normal.     Gait: Gait abnormal.  Psychiatric:        Mood and Affect: Mood normal.        Speech: Speech normal.        Behavior: Behavior normal.        Cognition and Memory: Cognition is impaired. Memory is impaired.    Physical Exam   VITALS: T- 97.2, P- 56, BP- 124/68, SaO2- 100% GENERAL: Drowsy but responsive, cooperative, well developed, no acute distress. HEENT: Normocephalic, normal oropharynx, moist mucous membranes. CHEST: Clear to auscultation bilaterally, no wheezes, rhonchi, or crackles. CARDIOVASCULAR: Normal heart rate and rhythm, S1 and S2 normal without murmurs. ABDOMEN: Soft, non-tender,  non-distended, without organomegaly, normal bowel sounds. EXTREMITIES: Bilateral leg swelling, no cyanosis. NEUROLOGICAL: Cranial nerves grossly intact, moves all extremities without gross motor or sensory deficit.  SKIN: No rash,no lesion or erythema   PSYCHIATRY/BEHAVIORAL: Mood  stable   Labs reviewed: Recent Labs    01/19/24 1238 01/20/24 0634 01/21/24 0506  NA 141 139 140  K 4.1 5.0 4.5  CL 108 108 113*  CO2 22 21* 22  GLUCOSE 144* 134* 109*  BUN 27* 28* 25*  CREATININE 1.61* 1.51* 1.62*  CALCIUM 8.9 8.5* 8.5*   Recent Labs    01/19/24 1238 01/20/24 0634 01/21/24 0506  AST 31 25 24   ALT 17 12 15   ALKPHOS 56 51 48  BILITOT 0.6 0.7 0.4  PROT 7.4 6.2* 6.0*  ALBUMIN 3.1* 2.7* 2.5*   Recent Labs    01/19/24 1238 01/20/24 0634 01/21/24 0506  WBC 6.2 6.1 5.2  NEUTROABS 3.7 3.6 1.7  HGB 12.1 11.6* 11.1*  HCT 38.5 38.1 33.2*  MCV 98.7 104.1* 97.6  PLT 299 262 242   Lab Results  Component Value Date   TSH 1.814 01/20/2024   Lab Results  Component Value Date   HGBA1C 6.7 (H) 10/26/2023   Lab Results  Component Value Date   CHOL 156 10/26/2023   HDL 53 10/26/2023   LDLCALC 83 10/26/2023   TRIG 108 10/26/2023   CHOLHDL 2.9 10/26/2023    Significant Diagnostic Results in last 30 days:  CT ABDOMEN PELVIS W CONTRAST Result Date: 01/20/2024 EXAM: CT ABDOMEN AND PELVIS WITH CONTRAST 01/20/2024 12:28:49 AM TECHNIQUE: CT of the abdomen and pelvis was performed with the administration of 75 mL of iohexol  (OMNIPAQUE ) 350 MG/ML injection. Multiplanar reformatted images are provided for review. Automated exposure control, iterative reconstruction, and/or weight-based adjustment of the mA/kV was utilized to reduce the radiation dose to as low as reasonably achievable. COMPARISON: None available. CLINICAL HISTORY: Abdominal pain, acute, nonlocalized. FINDINGS: LOWER CHEST: No acute abnormality. LIVER: The liver is unremarkable. GALLBLADDER AND BILE DUCTS: Gallbladder is unremarkable. No biliary ductal dilatation. SPLEEN: No acute abnormality. PANCREAS: No acute abnormality. ADRENAL GLANDS: No acute abnormality. KIDNEYS, URETERS AND BLADDER: Simple bilateral renal cysts, measuring up to 5.8 cm in the left upper kidney, benign. No follow-up is recommended.  Per consensus, no follow-up is needed for simple Bosniak type 1 and 2 renal cysts, unless the patient has a malignancy history or risk factors. No stones in the kidneys or ureters. No hydronephrosis. No perinephric or periureteral stranding. Urinary bladder is unremarkable. GI AND BOWEL: Stomach demonstrates no acute abnormality. Tiny hiatal hernia. There is no bowel obstruction. PERITONEUM AND RETROPERITONEUM: No ascites. No free air. VASCULATURE: Aorta is normal in caliber. Atherosclerotic calcifications of the abdominal aorta. LYMPH NODES: No lymphadenopathy. REPRODUCTIVE ORGANS: Status post hysterectomy. BONES AND SOFT TISSUES: Degenerative changes of the visualized thoracolumbar spine. Moderate fat-containing periumbilical hernia. No acute osseous abnormality. No other focal soft tissue abnormality. IMPRESSION: 1. No acute findings in the abdomen or pelvis. Electronically signed by: Pinkie Pebbles MD 01/20/2024 12:40 AM EST RP Workstation: [id number]   DG Knee Complete 4 Views Right Result Date: 01/19/2024 CLINICAL DATA:  Pain. EXAM: RIGHT KNEE - COMPLETE 4+ VIEW COMPARISON:  06/18/2021. FINDINGS: No acute fracture or dislocation. Severe lateral compartment osteoarthritis with joint space narrowing and osteophytosis. Moderate patellofemoral and medial compartment osteoarthritis. Small joint effusion. IMPRESSION: 1. No acute osseous abnormality. 2. Moderate to severe tricompartmental osteoarthritis, most pronounced in the lateral femorotibial compartment. Electronically Signed   By: Harrietta  Lateef M.D.   On: 01/19/2024 13:39   CT Cervical Spine Wo Contrast Result Date: 01/19/2024 EXAM: CT Cervical Spine Without Intravenous Contrast. CLINICAL HISTORY: 83 year old female status post fall, found down on floor. Neck trauma. TECHNIQUE: Axial computed tomography images of the cervical spine without intravenous contrast. Sagittal and coronal reformations performed. Dose reduction technique was used including  one or more of the following: automated exposure control, adjustment of mA and kV according to patient size, and/or iterative reconstruction. COMPARISON: CT head 01/19/2024 12:48:07 PM reported separately. FINDINGS: BONES: Straightening and mild reversal of the normal cervical lordosis. No acute fracture or focal osseous lesion. Degenerative facet ankylosis on the right at C2-C3. Normal bone mineralization for age. Flowing anterior endplate osteophytes in the lower cervical spine. No other levels of ankylosis identified. DISCS / DEGENERATIVE CHANGES: Partially calcified degenerative ligamentous hypertrophy about the odontoid. Chronic cervical spine degeneration superimposed on degenerative C2-C3 facet ankylosis. Cervical disc degeneration appears most pronounced at C4-C5, with a partially calcified midline disc protrusion there. No significant spinal stenosis by CT. SOFT TISSUES: No prevertebral soft tissue swelling. Retropharyngeal course of the carotid arteries in the neck with mild calcified atherosclerosis. Otherwise negative visible non-contrast neck soft tissues. Negative visible non-contrast thoracic inlet. IMPRESSION: 1. No acute traumatic injury identified in the cervical spine. 2. Chronic cervical spine degeneration superimposed on degenerative C2 C3 facet ankylosis. Electronically signed by: Helayne Hurst MD 01/19/2024 01:03 PM EST RP Workstation: [id number]   CT Head Wo Contrast Result Date: 01/19/2024 EXAM: CT Head Without Intravenous Contrast. CLINICAL HISTORY: 83 year old female with minor head trauma, found down on the floor this morning. TECHNIQUE: Axial computed tomography images of the head/brain without intravenous contrast. Dose reduction technique was used including one or more of the following: automated exposure control, adjustment of mA and kV according to patient size, and/or iterative reconstruction. CONTRAST: Without. COMPARISON: None. FINDINGS: BRAIN: Brain volume within normal limits  for age. Mild for age periventricular white matter hypodensity. No acute intraparenchymal hemorrhage. No mass lesion. No CT evidence for acute territorial infarct. No midline shift or extra-axial collection. Calcified atherosclerosis at the skull base. No suspicious intracranial vascular hyperdensity. VENTRICLES: No hydrocephalus. SINUSES AND MASTOIDS: The paranasal sinuses, tympanic cavities, and mastoids are well aerated. SOFT TISSUES: No acute orbital or scalp soft tissue injury identified. No radiopaque foreign body is seen. BONES: No acute skull fracture. IMPRESSION: 1. No acute traumatic injury identified. 2. Normal for age non-contrast CT appearance of the brain. Electronically signed by: Helayne Hurst MD 01/19/2024 01:00 PM EST RP Workstation: [id number]    Assessment/Plan  Altered mental status with generalized weakness and unsteady gait Altered mental status with generalized weakness and unsteady gait, likely due to suspected urinary tract infection, dehydration, and possible dementia. Recent hospitalization for similar symptoms. Current symptoms include drowsiness, inability to sleep, and difficulty walking. No acute pain reported. - Sent to emergency department for further evaluation and management - will need blood work to assess for dehydration and other potential causes - consider cardiac evaluation due to shortness of breath and leg swelling  Suspected urinary tract infection Persistent foul-smelling and cloudy urine despite a 5-day course of Bactrim . Urine analysis shows negative nitrates but positive leukocytes, indicating possible infection. Awaiting urine culture results to guide antibiotic therapy. - Sent urine sample for culture to identify specific pathogen - Await culture results to determine appropriate antibiotic therapy  Diarrhea with dehydration risk Intermittent diarrhea with risk of dehydration, especially given her age and current symptoms. No abdominal pain reported,  but she has been rubbing her stomach. Dehydration risk is heightened by her inability to eat and drink adequately. - Ensure adequate hydration, possibly requiring IV fluids if dehydrated - Monitor for signs of dehydration and adjust fluid intake accordingly  Dementia with increased confusion Dementia with increased confusion, likely exacerbated by current infections and dehydration. She is not eating or drinking adequately, which may contribute to her confusion and altered mental status. - Ensure adequate nutrition and hydration  Family/ staff Communication: Reviewed plan of care with patient verbalized understanding   Labs/tests ordered:  - Urine Culture; Future - POC Urinalysis Dipstick   Next Appointment: Return if symptoms worsen or fail to improve.   Total time: 30 minutes. Greater than 50% of total time spent doing patient education regarding Altered mental status with generalized weakness and unsteady gait,Diarrhea with dehydration risk,Dementia with increased confusion and health maintenance including symptom/medication management.   Tamanika Heiney C Roshaun Pound, NP    [1]  Allergies Allergen Reactions   Augmentin [Amoxicillin-Pot Clavulanate] Rash    Critically high - Onset 07/13/2019   Amoxicillin     Did it involve swelling of the face/tongue/throat, SOB, or low BP? Y Did it involve sudden or severe rash/hives, skin peeling, or any reaction on the inside of your mouth or nose? Y (Rash, Swelling, SOB) Did you need to seek medical attention at a hospital or doctor's office? Y When did it last happen?  Yesterday     If all above answers are NO, may proceed with cephalosporin use.    Shellfish Allergy     "

## 2024-02-06 NOTE — Telephone Encounter (Signed)
 FYI Only or Action Required?: FYI only for provider: appointment scheduled on today. Daughter/caller would also like Amy to place a UA and daughter will bring a clean catch to the clinic today. Please call pt daughter back today and advise. Pt is scheduled with PCP at 1400, pt would like Amy to write for new abx.   Patient was last seen in primary care on 01/26/2024 by Gil Greig BRAVO, NP.  Called Nurse Triage reporting Urinary Tract Infection.  Symptoms began several weeks ago.  Interventions attempted: Prescription medications: abx.  Symptoms are: unchanged.  Triage Disposition: See Physician Within 24 Hours  Patient/caregiver understands and will follow disposition?: Yes, will follow disposition  Copied from CRM #[id number]. Topic: Clinical - Red Word Triage >> Feb 06, 2024  8:31 AM Farrel B wrote: [facility] that prompted transfer to Nurse Triage: worsening UTI, 3 stage of dementia, extremely smelly, up all night saw the provider on 01/26/24   ----------------------------------------------------------------------- From previous Reason for Contact - Scheduling: Patient/patient representative is calling to schedule an appointment. Refer to attachments for appointment information. >> Feb 06, 2024  8:41 AM Antonio DEL wrote: Daughter returning call,currently on hold for NT >> Feb 06, 2024  8:38 AM Farrel B wrote: Daughter disconnected call in regards to mother with UTI Reason for Disposition  Age > 50 years  Answer Assessment - Initial Assessment Questions 1. SEVERITY: How bad is the pain?  (e.g., Scale 1-10; mild, moderate, or severe)     Smelly urine,  2. FREQUENCY: How many times have you had painful urination today?      Has been awake numerous times throughout the night 3. PATTERN: Is pain present every time you urinate or just sometimes?      Has had odorous urine since last week when she was seen  Daughter states that she was not able to give a UA with visit last week but  daughter states that she was given oral abx last week and does not believe that is helping. Pt is still having odorous urine, and was up all night with urine. Daughter states that she would like to bring in a urine sample today and have it run, because the medication that she took did not help at all. Daughter states she will not be going back to the [facility] because last time she waited there 11 hours and it is not safe because my mom touches everything, it is not mining engineer.  Protocols used: Urination Pain - Female-A-AH

## 2024-02-06 NOTE — ED Triage Notes (Signed)
 Seen today by pcp Sent for eval Recent hospitalization on abt for UTI Reporting some chest pain Reports symptoms since discharge  Dementia daughter with patient

## 2024-02-06 NOTE — Progress Notes (Signed)
 Plan of Care Note for accepted transfer   Patient: Michelle Rowe MRN: [id number]   DOA: 02/06/2024  Facility requesting transfer: [facility]   Requesting Provider: Dr. Pamella   Reason for transfer: AKI, acute encephalopathy   Facility course: 83 yr old female with dementia, HTN, HLD, DM, CKD 3B and recent treatment for UTI who presents with increased confusion. SCr is 2.86 (1.62 on 01/21/24) and potassium 5.9. UA notable for bacteriuria, pyuria, and nitrites.   Blood and urine cultures were collected and she was treated with IVF, vancomycin , and aztreonam .   Plan of care: The patient is accepted for admission to Telemetry unit, at [facility].   Author: Evalene GORMAN Sprinkles, MD 02/06/2024  Check [email or web address] for on-call coverage.  Nursing staff, Please call [facility] Admits & Consults System-Wide number on Amion as soon as patient's arrival, so appropriate admitting provider can evaluate the pt.

## 2024-02-06 NOTE — Patient Instructions (Signed)
 Please go to ED for evaluation of generalized weakness

## 2024-02-07 ENCOUNTER — Encounter (HOSPITAL_COMMUNITY): Payer: Self-pay | Admitting: Internal Medicine

## 2024-02-07 DIAGNOSIS — E875 Hyperkalemia: Secondary | ICD-10-CM | POA: Diagnosis present

## 2024-02-07 DIAGNOSIS — E6609 Other obesity due to excess calories: Secondary | ICD-10-CM | POA: Diagnosis present

## 2024-02-07 DIAGNOSIS — I5032 Chronic diastolic (congestive) heart failure: Secondary | ICD-10-CM | POA: Diagnosis present

## 2024-02-07 DIAGNOSIS — N390 Urinary tract infection, site not specified: Secondary | ICD-10-CM | POA: Diagnosis present

## 2024-02-07 DIAGNOSIS — Z880 Allergy status to penicillin: Secondary | ICD-10-CM | POA: Diagnosis not present

## 2024-02-07 DIAGNOSIS — Z881 Allergy status to other antibiotic agents status: Secondary | ICD-10-CM | POA: Diagnosis not present

## 2024-02-07 DIAGNOSIS — Z8249 Family history of ischemic heart disease and other diseases of the circulatory system: Secondary | ICD-10-CM | POA: Diagnosis not present

## 2024-02-07 DIAGNOSIS — N1832 Chronic kidney disease, stage 3b: Secondary | ICD-10-CM | POA: Diagnosis not present

## 2024-02-07 DIAGNOSIS — N179 Acute kidney failure, unspecified: Secondary | ICD-10-CM | POA: Diagnosis present

## 2024-02-07 DIAGNOSIS — F05 Delirium due to known physiological condition: Secondary | ICD-10-CM | POA: Diagnosis present

## 2024-02-07 DIAGNOSIS — N184 Chronic kidney disease, stage 4 (severe): Secondary | ICD-10-CM | POA: Diagnosis present

## 2024-02-07 DIAGNOSIS — Z91013 Allergy to seafood: Secondary | ICD-10-CM | POA: Diagnosis not present

## 2024-02-07 DIAGNOSIS — G301 Alzheimer's disease with late onset: Secondary | ICD-10-CM | POA: Diagnosis present

## 2024-02-07 DIAGNOSIS — Z7982 Long term (current) use of aspirin: Secondary | ICD-10-CM | POA: Diagnosis not present

## 2024-02-07 DIAGNOSIS — I130 Hypertensive heart and chronic kidney disease with heart failure and stage 1 through stage 4 chronic kidney disease, or unspecified chronic kidney disease: Secondary | ICD-10-CM | POA: Diagnosis present

## 2024-02-07 DIAGNOSIS — E860 Dehydration: Secondary | ICD-10-CM | POA: Diagnosis present

## 2024-02-07 DIAGNOSIS — Z6839 Body mass index (BMI) 39.0-39.9, adult: Secondary | ICD-10-CM | POA: Diagnosis not present

## 2024-02-07 DIAGNOSIS — E1122 Type 2 diabetes mellitus with diabetic chronic kidney disease: Secondary | ICD-10-CM | POA: Diagnosis present

## 2024-02-07 DIAGNOSIS — K521 Toxic gastroenteritis and colitis: Secondary | ICD-10-CM | POA: Diagnosis present

## 2024-02-07 DIAGNOSIS — F0280 Dementia in other diseases classified elsewhere without behavioral disturbance: Secondary | ICD-10-CM | POA: Diagnosis present

## 2024-02-07 DIAGNOSIS — N170 Acute kidney failure with tubular necrosis: Secondary | ICD-10-CM | POA: Diagnosis present

## 2024-02-07 DIAGNOSIS — B9620 Unspecified Escherichia coli [E. coli] as the cause of diseases classified elsewhere: Secondary | ICD-10-CM | POA: Diagnosis present

## 2024-02-07 DIAGNOSIS — Z833 Family history of diabetes mellitus: Secondary | ICD-10-CM | POA: Diagnosis not present

## 2024-02-07 DIAGNOSIS — E66812 Obesity, class 2: Secondary | ICD-10-CM | POA: Diagnosis present

## 2024-02-07 DIAGNOSIS — E785 Hyperlipidemia, unspecified: Secondary | ICD-10-CM | POA: Diagnosis present

## 2024-02-07 DIAGNOSIS — Z66 Do not resuscitate: Secondary | ICD-10-CM | POA: Diagnosis present

## 2024-02-07 DIAGNOSIS — N178 Other acute kidney failure: Secondary | ICD-10-CM | POA: Diagnosis not present

## 2024-02-07 LAB — CBG MONITORING, ED
Glucose-Capillary: 164 mg/dL — ABNORMAL HIGH (ref 70–99)
Glucose-Capillary: 99 mg/dL (ref 70–99)

## 2024-02-07 LAB — GLUCOSE, CAPILLARY
Glucose-Capillary: 129 mg/dL — ABNORMAL HIGH (ref 70–99)
Glucose-Capillary: 160 mg/dL — ABNORMAL HIGH (ref 70–99)

## 2024-02-07 MED ORDER — ONDANSETRON HCL 4 MG PO TABS: 4.0000 mg | ORAL_TABLET | Freq: Four times a day (QID) | ORAL | Status: DC | PRN

## 2024-02-07 MED ORDER — ACETAMINOPHEN 325 MG PO TABS: 650.0000 mg | ORAL_TABLET | Freq: Four times a day (QID) | ORAL | Status: DC | PRN

## 2024-02-07 MED ORDER — ACETAMINOPHEN 650 MG RE SUPP: 650.0000 mg | Freq: Four times a day (QID) | RECTAL | Status: DC | PRN

## 2024-02-07 MED ORDER — HEPARIN SODIUM (PORCINE) 5000 UNIT/ML IJ SOLN
5000.0000 [IU] | Freq: Three times a day (TID) | INTRAMUSCULAR | Status: DC
  Administered 2024-02-07 – 2024-02-09 (×5): 5000 [IU] via SUBCUTANEOUS
  Filled 2024-02-07 (×6): qty 1

## 2024-02-07 MED ORDER — SODIUM CHLORIDE 0.9 % IV SOLN: INTRAVENOUS | Status: DC

## 2024-02-07 MED ORDER — INSULIN ASPART 100 UNIT/ML IJ SOLN
0.0000 [IU] | Freq: Three times a day (TID) | INTRAMUSCULAR | Status: DC
  Administered 2024-02-07 – 2024-02-08 (×3): 1 [IU] via SUBCUTANEOUS
  Administered 2024-02-08: 2 [IU] via SUBCUTANEOUS
  Filled 2024-02-07 (×3): qty 1

## 2024-02-07 MED ORDER — ONDANSETRON HCL 4 MG/2ML IJ SOLN: 4.0000 mg | Freq: Four times a day (QID) | INTRAMUSCULAR | Status: DC | PRN

## 2024-02-07 NOTE — ED Notes (Signed)
 I have just given report to Art Therapist at [facility].

## 2024-02-07 NOTE — ED Notes (Signed)
 Patient had an episode of urinary incontinence. Patient cleaned and dressed in clean gown and clean sheets.

## 2024-02-07 NOTE — ED Notes (Signed)
 Pt's daughter at bedside giving bed bath. Pt's daughter notified that after her care, we will update her vitals and help turn her in bed.

## 2024-02-07 NOTE — ED Notes (Signed)
 Pt given cranberry juice and encouraged intake per daughter.

## 2024-02-07 NOTE — Progress Notes (Signed)
 Patient arrived to unit. She is ambulatory, 1 assist. Pt is alert oriented to self only.

## 2024-02-07 NOTE — ED Notes (Signed)
 [place] with cl called for transport

## 2024-02-07 NOTE — ED Notes (Signed)
 She is sleeping soundly. Her daughter remains with her.

## 2024-02-07 NOTE — H&P (Addendum)
 "  ADMISSION HISTORY AND PHYSICAL   Michelle Rowe FMW:[id number] DOB: 08-15-1940 DOA: 02/06/2024  PCP: Leonarda Roxan BROCKS, NP Patient coming from: home via [facility]   Chief Complaint: worsening confusion   HPI:  83 year old with a history of advanced dementia w/ recently worsening agitation and confusion, obesity, OSA, HTN, HLD, DM2, CKD stage IIIb, and recent inpatient treatment for a UTI (12/11-13/2025) who presented to [facility] 12/29 with worsening confusion.  In the ER she was found to have a creatinine of 2.86 compared to a baseline of 1.6 on 01/21/2024, with potassium of 5.9.  UA was abnormal. Patient has been seen as an outpatient twice since her discharge, with complaints of urinary frequency and strong urine odor, as well as diarrhea following use of bactrim . At the time of her second outpatient visit 02/06/24) she was found to be markedly dehydraged and very weak in general, and was referred to the ER.   Assessment/Plan  Acute renal failure on CKD Stage IIIb Baseline crt 1.5-1.6 earlier this month - prerenal in etiology +/- component of ATN - hydrate and follow trend   Hyperkalemia  Due to AKI - should improve quickly with hydration and improvement in renal function - monitor   Advanced dementia with acute delirium Acute delirium likely related to poor intake and dehydration in setting of abx associated diarrhea, as well as recurrent UTIs - extent to which her mental status will improve is not clear - may need to consider a different living environment if she does not improve with correction of her acute issues   Diarrhea Likely due to antibiotic use - no clinical sx to suggest an acute infectious etiology at this time - monitor   Recurrent UTIs / abnormal UA at admission  At this time it is not possible to know if the patient is having UTI sx due to her dementia - given that she has received mutliple frequent doses of abx I have opted to not treat her with  abx for now - will obtain urine culture and follow, to rule out a resistant organism   DM2 Follow CBG - no indication for strict control at this time - wish to avoid extremes of CBG - hold usual home glucotrol  - monitor on SSI only   HTN No indication for strict BP control in this setting - follow trend   DNR / NO CODE BLUE Discussed with daughter/POA at bedside - she agreed with me that subjecting this lady to CPR/intubation would be inappropriate in the setting of her advanced dementia - she has agreed to DNR status    DVT prophylaxis: SQ heparin   Code Status:   Code Status: Limited: Do not attempt resuscitation (DNR) -DNR-LIMITED -Do Not Intubate/DNI  Family Communication: spoke w/ daughter/POA at bedside  Disposition Plan:  Admit to Inpatient   Review of Systems: As per HPI otherwise 10 point review of systems negative.   Past Medical History:  Diagnosis Date   Anterolisthesis    Grade 1 of L3 on L4 on x-ray 07/18/19   Aortic regurgitation    Seen on 2D echo 07/07/16   Cataract    CKD (chronic kidney disease), stage III (HCC)    DDD (degenerative disc disease), lumbar    Diabetes mellitus without complication (HCC)    Diastolic CHF (HCC)    Grade 2/4 with LVEF of 60-65%   Diastolic dysfunction    Grade 2/4 with LVEF of 60-65% and LVH   Gout    HLD (  hyperlipidemia)    Hyperparathyroidism    Hypertension    Lumbar facet arthropathy    Memory impairment    Mitral valve regurgitation    Seen on 2D echo 07/07/16   OA (osteoarthritis)    OSA (obstructive sleep apnea)    Osteoarthritis    Polycythemia    Shingles    Sleep apnea    Thrombocytopenia    Vitamin D deficiency     Past Surgical History:  Procedure Laterality Date   LAPAROSCOPIC HYSTERECTOMY      Family History  Family History  Problem Relation Age of Onset   Diabetes Mother    Hypertension Father    Hypertension Brother    Hypertension Sister    Diabetes Sister     Social History   reports that  she has never smoked. She has never used smokeless tobacco. She reports that she does not drink alcohol and does not use drugs.  Allergies Allergies[1]  Prior to Admission medications  Medication Sig Start Date End Date Taking? Authorizing Provider  allopurinol  (ZYLOPRIM ) 100 MG tablet Take 1 tablet (100 mg total) by mouth daily. 10/26/23   Ngetich, [name] C, NP  amLODipine  (NORVASC ) 10 MG tablet Take 1 tablet (10 mg total) by mouth daily. Needs an appointment before anymore future refills. 10/26/23   Ngetich, [name] C, NP  ARIPiprazole  (ABILIFY ) 2 MG tablet Take 1 tablet (2 mg total) by mouth daily. 01/12/24   Ngetich, [name] C, NP  aspirin  81 MG chewable tablet Chew 1 tablet (81 mg total) by mouth daily. 10/26/23   Ngetich, [name] C, NP  azelastine  (ASTELIN ) 0.1 % nasal spray Place 1 spray into both nostrils 2 (two) times daily. Use in each nostril as directed 10/26/23   Ngetich, [name] C, NP  Blood Glucose Monitoring Suppl DEVI 1 each by Does not apply route in the morning, at noon, and at bedtime. May substitute to any manufacturer covered by patient's insurance. 02/18/23   Ngetich, [name] C, NP  divalproex  (DEPAKOTE  SPRINKLE) 125 MG capsule Take 2 capsules (250 mg total) by mouth 2 (two) times daily. 01/26/24   Fargo, Amy E, NP  EPINEPHrine  (EPIPEN  2-PAK) 0.3 mg/0.3 mL IJ SOAJ injection Inject 0.3 mg into the muscle as needed for anaphylaxis. 02/18/23   Ngetich, [name] C, NP  fluticasone  (FLONASE ) 50 MCG/ACT nasal spray USE 1 SPRAY(S) IN EACH NOSTRIL TWICE DAILY AS NEEDED FOR ALLERGIES 10/26/23   Ngetich, [name] C, NP  glipiZIDE  (GLUCOTROL ) 5 MG tablet Take 1 tablet (5 mg total) by mouth daily. 10/26/23   Ngetich, [name] C, NP  glucose blood test strip 1 each by Other route as needed for other. Use as instructed 10/26/23   Ngetich, [name] C, NP  guaiFENesin  (MUCINEX ) 600 MG 12 hr tablet Take 1 tablet (600 mg total) by mouth 2 (two) times daily as needed. 10/26/23   Ngetich, [name] C, NP  hydrALAZINE  (APRESOLINE )  25 MG tablet Take 1 tablet (25 mg total) by mouth 3 (three) times daily. 10/26/23   Ngetich, [name] C, NP  Lancets Ultra Thin 30G MISC Inject 1 each into the skin daily. 10/26/23   Ngetich, [name] C, NP  loratadine  (CLARITIN ) 10 MG tablet Take 1 tablet (10 mg total) by mouth daily as needed for allergies. 10/26/23   Ngetich, [name] C, NP  LORazepam  (ATIVAN ) 0.5 MG tablet Take 1 tablet (0.5 mg total) by mouth 2 (two) times daily as needed. for anxiety 10/26/23   Ngetich, [name] C, NP  losartan  (COZAAR ) 50  MG tablet Take 1 tablet (50 mg total) by mouth daily. 10/26/23   Ngetich, [name] C, NP  simvastatin  (ZOCOR ) 10 MG tablet Take 1 tablet (10 mg total) by mouth at bedtime. 10/26/23   Ngetich, Roxan BROCKS, NP    Physical Exam: Vitals:   02/07/24 0400 02/07/24 0637 02/07/24 0739 02/07/24 1025  BP:  (!) 153/74 (!) 98/57 117/60  Pulse: (!) 39  (!) 40 (!) 55  Resp: 17 17 18 16   Temp:   (!) 97 F (36.1 C)   TempSrc:   Axillary   SpO2: 100% 100% 100% 100%   General: No acute respiratory distress - alert but confused  Lungs: Clear to auscultation bilaterally without wheezes or crackles Cardiovascular: Regular rate and rhythm without murmur gallop or rub normal S1 and S2 Abdomen: Nontender, nondistended, soft, bowel sounds positive, no rebound, no ascites, no appreciable mass Extremities: No significant cyanosis, clubbing, or edema bilateral lower extremities   Labs on Admission:   CBC: Recent Labs  Lab 02/06/24 1622  WBC 5.1  HGB 12.2  HCT 37.9  MCV 97.4  PLT 255   Basic Metabolic Panel: Recent Labs  Lab 02/06/24 1622  NA 136  K 5.9*  CL 103  CO2 21*  GLUCOSE 89  BUN 41*  CREATININE 2.86*  CALCIUM 9.9   GFR: Estimated Creatinine Clearance: 15.6 mL/min (A) (by C-G formula based on SCr of 2.86 mg/dL (H)).  Liver Function Tests: Recent Labs  Lab 02/06/24 1622  AST 23  ALT 14  ALKPHOS 75  BILITOT 0.5  PROT 7.8  ALBUMIN 4.0   Recent Labs  Lab 02/06/24 1622  LIPASE 55*     CBG: Recent Labs  Lab 02/06/24 2221 02/07/24 0249 02/07/24 0835  GLUCAP 143* 164* 99    Urine analysis:    Component Value Date/Time   COLORURINE YELLOW 02/06/2024 1622   APPEARANCEUR CLEAR 02/06/2024 1622   LABSPEC 1.010 02/06/2024 1622   PHURINE 7.0 02/06/2024 1622   GLUCOSEU NEGATIVE 02/06/2024 1622   HGBUR TRACE (A) 02/06/2024 1622   BILIRUBINUR NEGATIVE 02/06/2024 1622   BILIRUBINUR positive 02/06/2024 1434   KETONESUR NEGATIVE 02/06/2024 1622   PROTEINUR 30 (A) 02/06/2024 1622   PROTEINUR Positive (A) 02/06/2024 1434   UROBILINOGEN 4.0 (A) 02/06/2024 1434   NITRITE POSITIVE (A) 02/06/2024 1622   NITRITE negative 02/06/2024 1434   LEUKOCYTESUR LARGE (A) 02/06/2024 1622   LEUKOCYTESUR Large (3+) (A) 02/06/2024 1434     Radiological Exams on Admission: DG Chest Port 1 View Result Date: 02/06/2024 IMPRESSION: 1. Low lung volumes. No acute cardiopulmonary abnormality. Electronically signed by: Morgane Naveau MD 02/06/2024 07:30 PM EST RP Workstation: [id number]    Reyes IVAR Moores, MD [facility] Office  [phone] Pager - Text Page per Amion as per below:  On-Call/Text Page:      [email or web address]  If 7PM-7AM, please contact night-coverage [email or web address] 02/07/2024, 11:15 AM        [1]  Allergies Allergen Reactions   Augmentin [Amoxicillin-Pot Clavulanate] Rash    Critically high - Onset 07/13/2019   Amoxicillin     Did it involve swelling of the face/tongue/throat, SOB, or low BP? Y Did it involve sudden or severe rash/hives, skin peeling, or any reaction on the inside of your mouth or nose? Y (Rash, Swelling, SOB) Did you need to seek medical attention at a hospital or doctor's office? Y When did it last happen?  Yesterday     If all above answers are NO, may proceed with cephalosporin  use.    Shellfish Allergy     "

## 2024-02-08 ENCOUNTER — Ambulatory Visit: Payer: Self-pay | Admitting: Family

## 2024-02-08 DIAGNOSIS — N178 Other acute kidney failure: Secondary | ICD-10-CM

## 2024-02-08 DIAGNOSIS — E875 Hyperkalemia: Secondary | ICD-10-CM | POA: Diagnosis present

## 2024-02-08 DIAGNOSIS — Z66 Do not resuscitate: Secondary | ICD-10-CM | POA: Diagnosis present

## 2024-02-08 DIAGNOSIS — R8290 Unspecified abnormal findings in urine: Secondary | ICD-10-CM | POA: Diagnosis present

## 2024-02-08 DIAGNOSIS — E6609 Other obesity due to excess calories: Secondary | ICD-10-CM

## 2024-02-08 DIAGNOSIS — N1832 Chronic kidney disease, stage 3b: Secondary | ICD-10-CM | POA: Diagnosis not present

## 2024-02-08 DIAGNOSIS — R197 Diarrhea, unspecified: Secondary | ICD-10-CM | POA: Diagnosis present

## 2024-02-08 LAB — BASIC METABOLIC PANEL WITH GFR
Anion gap: 11 (ref 5–15)
BUN: 23 mg/dL (ref 8–23)
CO2: 19 mmol/L — ABNORMAL LOW (ref 22–32)
Calcium: 9.2 mg/dL (ref 8.9–10.3)
Chloride: 109 mmol/L (ref 98–111)
Creatinine, Ser: 1.79 mg/dL — ABNORMAL HIGH (ref 0.44–1.00)
GFR, Estimated: 27 mL/min — ABNORMAL LOW
Glucose, Bld: 86 mg/dL (ref 70–99)
Potassium: 5.7 mmol/L — ABNORMAL HIGH (ref 3.5–5.1)
Sodium: 139 mmol/L (ref 135–145)

## 2024-02-08 LAB — AMMONIA: Ammonia: 30 umol/L (ref 9–35)

## 2024-02-08 LAB — URINE CULTURE
Culture: >=100000 — AB
MICRO NUMBER:: 17404979
SPECIMEN QUALITY:: ADEQUATE

## 2024-02-08 LAB — CBC
HCT: 35.7 % — ABNORMAL LOW (ref 36.0–46.0)
Hemoglobin: 11.3 g/dL — ABNORMAL LOW (ref 12.0–15.0)
MCH: 31.7 pg (ref 26.0–34.0)
MCHC: 31.7 g/dL (ref 30.0–36.0)
MCV: 100 fL (ref 80.0–100.0)
Platelets: 237 K/uL (ref 150–400)
RBC: 3.57 MIL/uL — ABNORMAL LOW (ref 3.87–5.11)
RDW: 13.7 % (ref 11.5–15.5)
WBC: 5 K/uL (ref 4.0–10.5)
nRBC: 0 % (ref 0.0–0.2)

## 2024-02-08 LAB — GLUCOSE, CAPILLARY
Glucose-Capillary: 160 mg/dL — ABNORMAL HIGH (ref 70–99)
Glucose-Capillary: 146 mg/dL — ABNORMAL HIGH (ref 70–99)
Glucose-Capillary: 135 mg/dL — ABNORMAL HIGH (ref 70–99)
Glucose-Capillary: 138 mg/dL — ABNORMAL HIGH (ref 70–99)

## 2024-02-08 LAB — HEMOGLOBIN A1C
Hgb A1c MFr Bld: 6.5 % — ABNORMAL HIGH (ref 4.8–5.6)
Mean Plasma Glucose: 139.85 mg/dL

## 2024-02-08 MED ORDER — SODIUM CHLORIDE 0.9 % IV SOLN
1.0000 g | INTRAVENOUS | Status: DC
  Administered 2024-02-08: 1 g via INTRAVENOUS
  Filled 2024-02-08: qty 10

## 2024-02-08 MED ORDER — HYDRALAZINE HCL 25 MG PO TABS
25.0000 mg | ORAL_TABLET | Freq: Three times a day (TID) | ORAL | Status: DC
  Administered 2024-02-08 – 2024-02-09 (×2): 25 mg via ORAL
  Filled 2024-02-08 (×2): qty 1

## 2024-02-08 MED ORDER — AMLODIPINE BESYLATE 10 MG PO TABS
10.0000 mg | ORAL_TABLET | Freq: Every day | ORAL | Status: DC
  Administered 2024-02-08 – 2024-02-09 (×2): 10 mg via ORAL
  Filled 2024-02-08 (×2): qty 1

## 2024-02-08 NOTE — Progress Notes (Signed)
 " Progress Note   Patient: Michelle Rowe FMW:[id number] DOB: [birth year] DOA: 02/06/2024     1 DOS: the patient was seen and examined on 02/08/2024   Brief hospital course: 83yo with h/o advanced dementia with recently worsening confusion and agitation, OSA, HTN, HLD, T2DM, and stage 3b CKD who presented on 12/29 for worsening confusion.  She was previously hospitalized from 12/11-13 for UTI.   Noted to have AKI (creatinine 2.86, baseline 1.6) with abnormal UA.  She also is noted to have diarrhea following recent use of Bactrim .        Assessment & Plan Acute renal failure superimposed on stage 3b chronic kidney disease (HCC) Baseline creatinine 1.5-1.6 earlier this month  Creatinine 2.86 on presentation Prerenal in etiology +/- component of ATN  Improved to 1.79 today Recheck in AM Abnormal urinalysis At this time it is not possible to know if the patient is having UTI symptoms due to her dementia Urine culture + for E coli Will treat with Ceftriaxone -> Cefadroxil 500 mg BID for 7 total days Late onset Alzheimer's dementia without behavioral disturbance (HCC) Acute delirium likely related to poor intake and dehydration in setting of abx associated diarrhea, as well as recurrent UTIs  She lives with her daughter, who feels comfortable caring for her She is basically back to baseline Will continue to monitor for 1 more day, anticipate dc to home tomorrow Continue Abilify , Depakote  sprinkles, Ativan  Hyperlipidemia LDL goal <70 Continue simvastatin  Essential hypertension Holding amlodipine , hydralazine , losartan  for now BP up to 152/136 Resume amlodipine  and hydralazine  Type 2 diabetes mellitus with stage 4 chronic kidney disease, without long-term current use of insulin  (HCC) A1c 6.5, good control Hold Glipizide  Cover with moderate-scale SSI Carb modified diet  Hyperkalemia Due to AKI  Slowly improving with hydration Recheck BMP in AM Diarrhea Likely due to antibiotic use -  no clinical sx to suggest an acute infectious etiology at this time Class 2 obesity due to excess calories with body mass index (BMI) of 39.0 to 39.9 in adult Body mass index is 39.07 kg/m.SABRA  Weight loss should be encouraged Outpatient PCP/bariatric medicine f/u encouraged Significantly low or high BMI is associated with higher medical risk including morbidity and mortality  DNR (do not resuscitate) Discussed with daughter/POA at bedside - she has agreed to DNR status  Out of facility DNR form was placed in the chart       Consultants: None  Procedures: None  Antibiotics: Aztreonam  x 1 Vancomycin  x 1   30 Day Unplanned Readmission Risk Score    Flowsheet Row ED to Hosp-Admission (Current) from 02/06/2024 in [facility]  30 Day Unplanned Readmission Risk Score (%) 17.48 Filed at 02/08/2024 0400    This score is the patient's risk of an unplanned readmission within 30 days of being discharged (0 -100%). The score is based on dignosis, age, lab data, medications, orders, and past utilization.   Low:  0-14.9   Medium: 15-21.9   High: 22-29.9   Extreme: 30 and above           Subjective: Pleasantly confused.  Eating, appears happy.  Daughter at bedside.   Objective: Vitals:   02/08/24 0014 02/08/24 1226  BP: 102/72 (!) 152/136  Pulse: 60 (!) 55  Resp: 20 18  Temp: 98.2 F (36.8 C) 98.2 F (36.8 C)  SpO2: 100% (!) 79%    Intake/Output Summary (Last 24 hours) at 02/08/2024 1719 Last data filed at 02/08/2024  9671 Gross per 24 hour  Intake 616.85 ml  Output --  Net 616.85 ml   Filed Weights   02/07/24 1610 02/07/24 1645  Weight: 96.9 kg 96.9 kg    Exam:  General:  Appears calm and comfortable and is in NAD Eyes:  normal lids, iris ENT:  grossly normal hearing, lips & tongue, mmm Cardiovascular:  RRR. No LE edema.  Respiratory:   CTA bilaterally with no wheezes/rales/rhonchi.  Normal respiratory effort. Abdomen:   soft, NT, ND Skin:  no rash or induration seen on limited exam Musculoskeletal:   no bony abnormality Psychiatric: blunted mood and affect, speech fluent and mildly inappropriate Neurologic:  CN 2-12 grossly intact, moves all extremities in coordinated fashion  Data Reviewed: I have reviewed the patient's lab results since admission.  Pertinent labs for today include:  K+ 5.7 CO2 19 BUN 23/Creatinine 1.79/GFR 27 WBC 5 Hgb 11.3 Urine culture >100k colonies E coli, resistant to Amp, Gent, Bactrim      Family Communication: Daughter was present      Code Status: Limited: Do not attempt resuscitation (DNR) -DNR-LIMITED -Do Not Intubate/DNI    Disposition: Status is: Inpatient Remains inpatient appropriate because: ongoing management     Time spent: 50 minutes  Unresulted Labs (From admission, onward)     Start     Ordered   02/09/24 0500  CBC  Tomorrow morning,   R        02/08/24 1716   02/09/24 0500  Basic metabolic panel with GFR  Tomorrow morning,   R        02/08/24 1716             Author: Delon Herald, MD 02/08/2024 5:19 PM  For on call review [email or web address].            "

## 2024-02-08 NOTE — Assessment & Plan Note (Addendum)
 Discussed with daughter/POA at bedside - she has agreed to DNR status  Out of facility DNR form was placed in the chart

## 2024-02-08 NOTE — Plan of Care (Signed)

## 2024-02-08 NOTE — Assessment & Plan Note (Addendum)
 Baseline creatinine 1.5-1.6 earlier this month  Creatinine 2.86 on presentation Prerenal in etiology +/- component of ATN  Improved to 1.79 today Recheck in AM

## 2024-02-08 NOTE — Hospital Course (Signed)
 83yo with h/o advanced dementia with recently worsening confusion and agitation, OSA, HTN, HLD, T2DM, and stage 3b CKD who presented on 12/29 for worsening confusion.  She was previously hospitalized from 12/11-13 for UTI.   Noted to have AKI (creatinine 2.86, baseline 1.6) with abnormal UA.  She also is noted to have diarrhea following recent use of Bactrim .

## 2024-02-08 NOTE — Assessment & Plan Note (Addendum)
 Holding amlodipine , hydralazine , losartan  for now BP up to 152/136 Resume amlodipine  and hydralazine 

## 2024-02-08 NOTE — Assessment & Plan Note (Addendum)
 A1c 6.5, good control Hold Glipizide  Cover with moderate-scale SSI Carb modified diet

## 2024-02-08 NOTE — Assessment & Plan Note (Addendum)
 Acute delirium likely related to poor intake and dehydration in setting of abx associated diarrhea, as well as recurrent UTIs  She lives with her daughter, who feels comfortable caring for her She is basically back to baseline Will continue to monitor for 1 more day, anticipate dc to home tomorrow Continue Abilify , Depakote  sprinkles, Ativan 

## 2024-02-08 NOTE — Plan of Care (Signed)
" °  Problem: Education: Goal: Knowledge of General Education information will improve Description: Including pain rating scale, medication(s)/side effects and non-pharmacologic comfort measures Outcome: Progressing   Problem: Health Behavior/Discharge Planning: Goal: Ability to manage health-related needs will improve Outcome: Progressing   Problem: Clinical Measurements: Goal: Ability to maintain clinical measurements within normal limits will improve Outcome: Progressing Goal: Will remain free from infection Outcome: Progressing Goal: Diagnostic test results will improve Outcome: Progressing Goal: Respiratory complications will improve Outcome: Progressing Goal: Cardiovascular complication will be avoided Outcome: Progressing   Problem: Nutrition: Goal: Adequate nutrition will be maintained Outcome: Progressing   Problem: Activity: Goal: Risk for activity intolerance will decrease Outcome: Progressing   Problem: Coping: Goal: Level of anxiety will decrease Outcome: Progressing   Problem: Pain Managment: Goal: General experience of comfort will improve and/or be controlled Outcome: Progressing   Problem: Skin Integrity: Goal: Risk for impaired skin integrity will decrease Outcome: Progressing   "

## 2024-02-08 NOTE — Assessment & Plan Note (Signed)
"   Continue simvastatin          "

## 2024-02-08 NOTE — Assessment & Plan Note (Addendum)
 Likely due to antibiotic use - no clinical sx to suggest an acute infectious etiology at this time

## 2024-02-08 NOTE — Progress Notes (Signed)
" °   02/08/24 1338  TOC Brief Assessment  Insurance and Status Reviewed  Patient has primary care physician Yes (Ngetich, [name] C, NP)  Home environment has been reviewed Home with daughter  Prior level of function: assist/ supervision  Prior/Current Home Services No current home services  Social Drivers of Health Review SDOH reviewed no interventions necessary  Readmission risk has been reviewed Yes  Transition of care needs no transition of care needs at this time    "

## 2024-02-08 NOTE — Assessment & Plan Note (Addendum)
 Due to AKI  Slowly improving with hydration Recheck BMP in AM

## 2024-02-08 NOTE — Assessment & Plan Note (Addendum)
 Body mass index is 39.07 kg/m.SABRA  Weight loss should be encouraged Outpatient PCP/bariatric medicine f/u encouraged Significantly low or high BMI is associated with higher medical risk including morbidity and mortality

## 2024-02-08 NOTE — Assessment & Plan Note (Addendum)
 At this time it is not possible to know if the patient is having UTI symptoms due to her dementia Urine culture + for E coli Will treat with Ceftriaxone -> Cefadroxil 500 mg BID for 7 total days

## 2024-02-09 DIAGNOSIS — N178 Other acute kidney failure: Secondary | ICD-10-CM | POA: Diagnosis not present

## 2024-02-09 DIAGNOSIS — N1832 Chronic kidney disease, stage 3b: Secondary | ICD-10-CM | POA: Diagnosis not present

## 2024-02-09 LAB — CBC
HCT: 36.2 % (ref 36.0–46.0)
Hemoglobin: 11.4 g/dL — ABNORMAL LOW (ref 12.0–15.0)
MCH: 31.6 pg (ref 26.0–34.0)
MCHC: 31.5 g/dL (ref 30.0–36.0)
MCV: 100.3 fL — ABNORMAL HIGH (ref 80.0–100.0)
Platelets: 232 K/uL (ref 150–400)
RBC: 3.61 MIL/uL — ABNORMAL LOW (ref 3.87–5.11)
RDW: 13.4 % (ref 11.5–15.5)
WBC: 5.6 K/uL (ref 4.0–10.5)
nRBC: 0 % (ref 0.0–0.2)

## 2024-02-09 LAB — BASIC METABOLIC PANEL WITH GFR
Anion gap: 9 (ref 5–15)
BUN: 15 mg/dL (ref 8–23)
CO2: 19 mmol/L — ABNORMAL LOW (ref 22–32)
Calcium: 9.2 mg/dL (ref 8.9–10.3)
Chloride: 111 mmol/L (ref 98–111)
Creatinine, Ser: 1.47 mg/dL — ABNORMAL HIGH (ref 0.44–1.00)
GFR, Estimated: 35 mL/min — ABNORMAL LOW
Glucose, Bld: 103 mg/dL — ABNORMAL HIGH (ref 70–99)
Potassium: 5.5 mmol/L — ABNORMAL HIGH (ref 3.5–5.1)
Sodium: 140 mmol/L (ref 135–145)

## 2024-02-09 LAB — GLUCOSE, CAPILLARY: Glucose-Capillary: 90 mg/dL (ref 70–99)

## 2024-02-09 MED ORDER — CEFADROXIL 500 MG PO CAPS: 500.0000 mg | ORAL_CAPSULE | Freq: Two times a day (BID) | ORAL | 0 refills | Status: AC

## 2024-02-09 MED ORDER — ACETAMINOPHEN 325 MG PO TABS: 650.0000 mg | ORAL_TABLET | Freq: Four times a day (QID) | ORAL | Status: AC | PRN

## 2024-02-09 NOTE — Assessment & Plan Note (Addendum)
 Discussed with daughter/POA at bedside - she has agreed to DNR status  Out of facility DNR form was placed in the chart

## 2024-02-09 NOTE — Assessment & Plan Note (Addendum)
 A1c 6.5, good control Hold Glipizide  Cover with moderate-scale SSI Carb modified diet

## 2024-02-09 NOTE — Discharge Summary (Signed)
 " Physician Discharge Summary   Patient: Michelle Rowe MRN: [id number] DOB: 1940/03/24  Admit date:     02/06/2024  Discharge date: 02/09/2024  Discharge Physician: Delon Herald   PCP: Leonarda Roxan BROCKS, NP   Recommendations at discharge:   Complete antibiotics (Cefadroxil twice daily for 6 more days) Follow up with NP Ngetich in 1-2 weeks  Discharge Diagnoses: Principal Problem:   Acute renal failure superimposed on stage 3b chronic kidney disease (HCC) Active Problems:   Essential hypertension   Type 2 diabetes mellitus with stage 4 chronic kidney disease, without long-term current use of insulin  (HCC)   Hyperlipidemia LDL goal <70   Late onset Alzheimer's dementia without behavioral disturbance (HCC)   DNR (do not resuscitate)   Class 2 obesity due to excess calories with body mass index (BMI) of 39.0 to 39.9 in adult   Hyperkalemia   Diarrhea   Abnormal urinalysis   Hospital Course: 84yo with h/o advanced dementia with recently worsening confusion and agitation, OSA, HTN, HLD, T2DM, and stage 3b CKD who presented on 12/29 for worsening confusion.  She was previously hospitalized from 12/11-13 for UTI.   Noted to have AKI (creatinine 2.86, baseline 1.6) with abnormal UA.  She also is noted to have diarrhea following recent use of Bactrim .    Assessment and Plan:  Assessment & Plan Acute renal failure superimposed on stage 3b chronic kidney disease (HCC) Baseline creatinine 1.5-1.6 earlier this month  Creatinine 2.86 on presentation Prerenal in etiology +/- component of ATN  Improved to 1.47 today, back to baseline Abnormal urinalysis At this time it is not possible to know if the patient is having UTI symptoms due to her dementia Urine culture + for E coli Will treat with Ceftriaxone -> Cefadroxil 500 mg BID for 7 total days Late onset Alzheimer's dementia without behavioral disturbance (HCC) Acute delirium likely related to poor intake and dehydration in setting of abx  associated diarrhea, as well as recurrent UTIs  She lives with her daughter, who feels comfortable caring for her She is back to baseline and ready for dc Continue Abilify , Depakote  sprinkles, Ativan  Hyperlipidemia LDL goal <70 Continue simvastatin  Essential hypertension Holding amlodipine , hydralazine , losartan  for now BP up to 152/136 Resume amlodipine  and hydralazine  Type 2 diabetes mellitus with stage 4 chronic kidney disease, without long-term current use of insulin  (HCC) A1c 6.5, good control Hold Glipizide  Cover with moderate-scale SSI Carb modified diet  Hyperkalemia Due to AKI  Slowly improving with hydration Anticipate ongoing improvement Diarrhea Likely due to antibiotic use - no clinical sx to suggest an acute infectious etiology at this time Class 2 obesity due to excess calories with body mass index (BMI) of 39.0 to 39.9 in adult Body mass index is 39.07 kg/m.SABRA  Weight loss should be encouraged Outpatient PCP/bariatric medicine f/u encouraged Significantly low or high BMI is associated with higher medical risk including morbidity and mortality  DNR (do not resuscitate) Discussed with daughter/POA at bedside - she has agreed to DNR status  Out of facility DNR form was placed in the chart      Consultants: None   Procedures: None   Antibiotics: Aztreonam  x 1 Vancomycin  x 1 Ceftriaxone 12/31 Cefadroxil 1/1-6   Pain control - [place]  Controlled Substance Reporting System database was reviewed. and patient was instructed, not to drive, operate heavy machinery, perform activities at heights, swimming or participation in water activities or provide baby-sitting services while on Pain, Sleep and Anxiety Medications; until their outpatient Physician  has advised to do so again. Also recommended to not to take more than prescribed Pain, Sleep and Anxiety Medications.   Disposition: Home Diet recommendation:  Regular diet DISCHARGE MEDICATION: Allergies  as of 02/09/2024       Reactions   Augmentin [amoxicillin-pot Clavulanate] Rash   Critically high - Onset 07/13/2019   Amoxicillin    Did it involve swelling of the face/tongue/throat, SOB, or low BP? Y Did it involve sudden or severe rash/hives, skin peeling, or any reaction on the inside of your mouth or nose? Y (Rash, Swelling, SOB) Did you need to seek medical attention at a hospital or doctor's office? Y When did it last happen?  Yesterday     If all above answers are NO, may proceed with cephalosporin use.   Shellfish Allergy          Medication List     TAKE these medications    acetaminophen  325 MG tablet Commonly known as: TYLENOL  Take 2 tablets (650 mg total) by mouth every 6 (six) hours as needed for mild pain (pain score 1-3) or fever (or Fever >/= 101).   allopurinol  100 MG tablet Commonly known as: ZYLOPRIM  Take 1 tablet (100 mg total) by mouth daily.   amLODipine  10 MG tablet Commonly known as: NORVASC  Take 1 tablet (10 mg total) by mouth daily. Needs an appointment before anymore future refills.   ARIPiprazole  2 MG tablet Commonly known as: Abilify  Take 1 tablet (2 mg total) by mouth daily. What changed: when to take this   aspirin  81 MG chewable tablet Chew 1 tablet (81 mg total) by mouth daily.   azelastine  0.1 % nasal spray Commonly known as: ASTELIN  Place 1 spray into both nostrils 2 (two) times daily. Use in each nostril as directed   Blood Glucose Monitoring Suppl Devi 1 each by Does not apply route in the morning, at noon, and at bedtime. May substitute to any manufacturer covered by patient's insurance.   cefadroxil 500 MG capsule Commonly known as: DURICEF Take 1 capsule (500 mg total) by mouth 2 (two) times daily for 6 days.   divalproex  125 MG capsule Commonly known as: DEPAKOTE  SPRINKLE Take 2 capsules (250 mg total) by mouth 2 (two) times daily.   EPINEPHrine  0.3 mg/0.3 mL Soaj injection Commonly known as: EpiPen  2-Pak Inject 0.3 mg  into the muscle as needed for anaphylaxis.   fluticasone  50 MCG/ACT nasal spray Commonly known as: FLONASE  USE 1 SPRAY(S) IN EACH NOSTRIL TWICE DAILY AS NEEDED FOR ALLERGIES What changed:  how much to take how to take this when to take this reasons to take this   glipiZIDE  5 MG tablet Commonly known as: GLUCOTROL  Take 1 tablet (5 mg total) by mouth daily.   glucose blood test strip 1 each by Other route as needed for other. Use as instructed   guaiFENesin  600 MG 12 hr tablet Commonly known as: MUCINEX  Take 1 tablet (600 mg total) by mouth 2 (two) times daily as needed. What changed: reasons to take this   hydrALAZINE  25 MG tablet Commonly known as: APRESOLINE  Take 1 tablet (25 mg total) by mouth 3 (three) times daily.   Lancets Ultra Thin 30G Misc Inject 1 each into the skin daily.   loratadine  10 MG tablet Commonly known as: CLARITIN  Take 1 tablet (10 mg total) by mouth daily as needed for allergies.   LORazepam  0.5 MG tablet Commonly known as: ATIVAN  Take 1 tablet (0.5 mg total) by mouth 2 (two) times daily  as needed. for anxiety   losartan  50 MG tablet Commonly known as: COZAAR  Take 1 tablet (50 mg total) by mouth daily.   simvastatin  10 MG tablet Commonly known as: ZOCOR  Take 1 tablet (10 mg total) by mouth at bedtime.        Discharge Exam:   Subjective: Feeling good, ready for dc.  Daughter is present and excited to take her home.   Objective: Vitals:   02/08/24 1931 02/09/24 0428  BP: 129/68 (!) 130/56  Pulse: (!) 47 82  Resp: 20 18  Temp: 98 F (36.7 C) 97.7 F (36.5 C)  SpO2: 100% 92%    Intake/Output Summary (Last 24 hours) at 02/09/2024 1133 Last data filed at 02/09/2024 9187 Gross per 24 hour  Intake 1789.25 ml  Output 1410 ml  Net 379.25 ml   Filed Weights   02/07/24 1610 02/07/24 1645  Weight: 96.9 kg 96.9 kg    Exam:  General:  Appears calm and comfortable and is in NAD, standing at bedside Eyes:  normal lids, iris ENT:   grossly normal hearing, lips & tongue, mmm Cardiovascular:  RRR. No LE edema.  Respiratory:   CTA bilaterally with no wheezes/rales/rhonchi.  Normal respiratory effort. Abdomen:  soft, NT, ND Skin:  no rash or induration seen on limited exam Musculoskeletal:  grossly normal tone BUE/BLE, good ROM, no bony abnormality Psychiatric:  pleasant mood and affect, speech fluent and appropriate, AOx1 Neurologic:  CN 2-12 grossly intact, moves all extremities in coordinated fashion  Data Reviewed: I have reviewed the patient's lab results since admission.  Pertinent labs for today include:   K+ 5.5 CO2 19, not clinically significant BUN 15/Creatinine 1.47/GFR 35, improved back to baseline WBC 5.6 Hgb 11.6    Condition at discharge: improving  The results of significant diagnostics from this hospitalization (including imaging, microbiology, ancillary and laboratory) are listed below for reference.   Imaging Studies: DG Chest Port 1 View Result Date: 02/06/2024 EXAM: 1 VIEW(S) XRAY OF THE CHEST 02/06/2024 05:18:29 PM COMPARISON: 07/31/2023 CLINICAL HISTORY: Chest pain FINDINGS: LUNGS AND PLEURA: Low lung volumes. No focal pulmonary opacity. No pleural effusion. No pneumothorax. HEART AND MEDIASTINUM: No acute abnormality of the cardiac and mediastinal silhouettes. BONES AND SOFT TISSUES: Degenerative changes of shoulders. No acute osseous abnormality. IMPRESSION: 1. Low lung volumes. No acute cardiopulmonary abnormality. Electronically signed by: Morgane Naveau MD 02/06/2024 07:30 PM EST RP Workstation: [id number]   CT ABDOMEN PELVIS W CONTRAST Result Date: 01/20/2024 EXAM: CT ABDOMEN AND PELVIS WITH CONTRAST 01/20/2024 12:28:49 AM TECHNIQUE: CT of the abdomen and pelvis was performed with the administration of 75 mL of iohexol  (OMNIPAQUE ) 350 MG/ML injection. Multiplanar reformatted images are provided for review. Automated exposure control, iterative reconstruction, and/or weight-based  adjustment of the mA/kV was utilized to reduce the radiation dose to as low as reasonably achievable. COMPARISON: None available. CLINICAL HISTORY: Abdominal pain, acute, nonlocalized. FINDINGS: LOWER CHEST: No acute abnormality. LIVER: The liver is unremarkable. GALLBLADDER AND BILE DUCTS: Gallbladder is unremarkable. No biliary ductal dilatation. SPLEEN: No acute abnormality. PANCREAS: No acute abnormality. ADRENAL GLANDS: No acute abnormality. KIDNEYS, URETERS AND BLADDER: Simple bilateral renal cysts, measuring up to 5.8 cm in the left upper kidney, benign. No follow-up is recommended. Per consensus, no follow-up is needed for simple Bosniak type 1 and 2 renal cysts, unless the patient has a malignancy history or risk factors. No stones in the kidneys or ureters. No hydronephrosis. No perinephric or periureteral stranding. Urinary bladder is unremarkable. GI AND BOWEL:  Stomach demonstrates no acute abnormality. Tiny hiatal hernia. There is no bowel obstruction. PERITONEUM AND RETROPERITONEUM: No ascites. No free air. VASCULATURE: Aorta is normal in caliber. Atherosclerotic calcifications of the abdominal aorta. LYMPH NODES: No lymphadenopathy. REPRODUCTIVE ORGANS: Status post hysterectomy. BONES AND SOFT TISSUES: Degenerative changes of the visualized thoracolumbar spine. Moderate fat-containing periumbilical hernia. No acute osseous abnormality. No other focal soft tissue abnormality. IMPRESSION: 1. No acute findings in the abdomen or pelvis. Electronically signed by: Pinkie Pebbles MD 01/20/2024 12:40 AM EST RP Workstation: [id number]   DG Knee Complete 4 Views Right Result Date: 01/19/2024 CLINICAL DATA:  Pain. EXAM: RIGHT KNEE - COMPLETE 4+ VIEW COMPARISON:  06/18/2021. FINDINGS: No acute fracture or dislocation. Severe lateral compartment osteoarthritis with joint space narrowing and osteophytosis. Moderate patellofemoral and medial compartment osteoarthritis. Small joint effusion. IMPRESSION: 1. No  acute osseous abnormality. 2. Moderate to severe tricompartmental osteoarthritis, most pronounced in the lateral femorotibial compartment. Electronically Signed   By: Harrietta Sherry M.D.   On: 01/19/2024 13:39   CT Cervical Spine Wo Contrast Result Date: 01/19/2024 EXAM: CT Cervical Spine Without Intravenous Contrast. CLINICAL HISTORY: 84 year old female status post fall, found down on floor. Neck trauma. TECHNIQUE: Axial computed tomography images of the cervical spine without intravenous contrast. Sagittal and coronal reformations performed. Dose reduction technique was used including one or more of the following: automated exposure control, adjustment of mA and kV according to patient size, and/or iterative reconstruction. COMPARISON: CT head 01/19/2024 12:48:07 PM reported separately. FINDINGS: BONES: Straightening and mild reversal of the normal cervical lordosis. No acute fracture or focal osseous lesion. Degenerative facet ankylosis on the right at C2-C3. Normal bone mineralization for age. Flowing anterior endplate osteophytes in the lower cervical spine. No other levels of ankylosis identified. DISCS / DEGENERATIVE CHANGES: Partially calcified degenerative ligamentous hypertrophy about the odontoid. Chronic cervical spine degeneration superimposed on degenerative C2-C3 facet ankylosis. Cervical disc degeneration appears most pronounced at C4-C5, with a partially calcified midline disc protrusion there. No significant spinal stenosis by CT. SOFT TISSUES: No prevertebral soft tissue swelling. Retropharyngeal course of the carotid arteries in the neck with mild calcified atherosclerosis. Otherwise negative visible non-contrast neck soft tissues. Negative visible non-contrast thoracic inlet. IMPRESSION: 1. No acute traumatic injury identified in the cervical spine. 2. Chronic cervical spine degeneration superimposed on degenerative C2 C3 facet ankylosis. Electronically signed by: Helayne Hurst MD 01/19/2024  01:03 PM EST RP Workstation: [id number]   CT Head Wo Contrast Result Date: 01/19/2024 EXAM: CT Head Without Intravenous Contrast. CLINICAL HISTORY: 84 year old female with minor head trauma, found down on the floor this morning. TECHNIQUE: Axial computed tomography images of the head/brain without intravenous contrast. Dose reduction technique was used including one or more of the following: automated exposure control, adjustment of mA and kV according to patient size, and/or iterative reconstruction. CONTRAST: Without. COMPARISON: None. FINDINGS: BRAIN: Brain volume within normal limits for age. Mild for age periventricular white matter hypodensity. No acute intraparenchymal hemorrhage. No mass lesion. No CT evidence for acute territorial infarct. No midline shift or extra-axial collection. Calcified atherosclerosis at the skull base. No suspicious intracranial vascular hyperdensity. VENTRICLES: No hydrocephalus. SINUSES AND MASTOIDS: The paranasal sinuses, tympanic cavities, and mastoids are well aerated. SOFT TISSUES: No acute orbital or scalp soft tissue injury identified. No radiopaque foreign body is seen. BONES: No acute skull fracture. IMPRESSION: 1. No acute traumatic injury identified. 2. Normal for age non-contrast CT appearance of the brain. Electronically signed by: Helayne Hurst MD 01/19/2024 01:00 PM EST RP  Workstation: [id number]    Microbiology: Results for orders placed or performed during the hospital encounter of 02/06/24  Culture, blood (routine x 2)     Status: None (Preliminary result)   Collection Time: 02/06/24  4:14 PM   Specimen: BLOOD  Result Value Ref Range Status   Specimen Description   Final    BLOOD RIGHT ANTECUBITAL Performed at [facility], [address] [place], [place] [address]    Special Requests   Final    Blood Culture adequate volume BOTTLES DRAWN AEROBIC ONLY Performed at [facility], [address]  [place], [place] [address]    Culture   Final    NO GROWTH 3 DAYS Performed at [facility], [address], [place], [place] [address]    Report Status PENDING  Incomplete  Culture, blood (routine x 2)     Status: None (Preliminary result)   Collection Time: 02/06/24  4:19 PM   Specimen: BLOOD  Result Value Ref Range Status   Specimen Description   Final    BLOOD BLOOD LEFT HAND Performed at [facility], [address], [place], [place] [address]    Special Requests   Final    Blood Culture adequate volume BOTTLES DRAWN AEROBIC ONLY Performed at [facility], [address] [place], [place] [address]    Culture   Final    NO GROWTH 3 DAYS Performed at [facility], [address] [place], [place] [address]    Report Status PENDING  Incomplete  Urine Culture     Status: Abnormal   Collection Time: 02/06/24  4:22 PM   Specimen: Urine, Random  Result Value Ref Range Status   Specimen Description   Final    URINE, RANDOM Performed at [facility], [address] [place], [place] [address]    Special Requests   Final    NONE Reflexed from [id number] Performed at [facility], [address], [place], [place] [address]    Culture >=100,000 COLONIES/mL ESCHERICHIA COLI (A)  Final   Report Status 02/08/2024 FINAL  Final   Organism ID, Bacteria ESCHERICHIA COLI (A)  Final      Susceptibility   Escherichia coli - MIC*    AMPICILLIN >=32 RESISTANT Resistant     CEFAZOLIN (URINE) Value in next row Sensitive      16 SENSITIVEThis is a modified FDA-approved test that has been validated and its performance characteristics determined by the reporting laboratory.  This laboratory is certified under the Clinical Laboratory Improvement Amendments CLIA as qualified to perform high complexity clinical laboratory testing.    CEFEPIME Value in next row Sensitive      16 SENSITIVEThis is a modified  FDA-approved test that has been validated and its performance characteristics determined by the reporting laboratory.  This laboratory is certified under the Clinical Laboratory Improvement Amendments CLIA as qualified to perform high complexity clinical laboratory testing.    ERTAPENEM Value in next row Sensitive      16 SENSITIVEThis is a modified FDA-approved test that has been validated and its performance characteristics determined by the reporting laboratory.  This laboratory is certified under the Clinical Laboratory Improvement Amendments CLIA as qualified to perform high complexity clinical laboratory testing.    CEFTRIAXONE Value in next row Sensitive      16 SENSITIVEThis is a modified FDA-approved test that has been validated and its performance characteristics determined by the reporting laboratory.  This laboratory is certified  under the Clinical Laboratory Improvement Amendments CLIA as qualified to perform high complexity clinical laboratory testing.    CIPROFLOXACIN Value in next row Sensitive      16 SENSITIVEThis is a modified FDA-approved test that has been validated and its performance characteristics determined by the reporting laboratory.  This laboratory is certified under the Clinical Laboratory Improvement Amendments CLIA as qualified to perform high complexity clinical laboratory testing.    GENTAMICIN  Value in next row Resistant      16 SENSITIVEThis is a modified FDA-approved test that has been validated and its performance characteristics determined by the reporting laboratory.  This laboratory is certified under the Clinical Laboratory Improvement Amendments CLIA as qualified to perform high complexity clinical laboratory testing.    NITROFURANTOIN Value in next row Sensitive      16 SENSITIVEThis is a modified FDA-approved test that has been validated and its performance characteristics determined by the reporting laboratory.  This laboratory is certified under the Clinical  Laboratory Improvement Amendments CLIA as qualified to perform high complexity clinical laboratory testing.    TRIMETH /SULFA  Value in next row Resistant      16 SENSITIVEThis is a modified FDA-approved test that has been validated and its performance characteristics determined by the reporting laboratory.  This laboratory is certified under the Clinical Laboratory Improvement Amendments CLIA as qualified to perform high complexity clinical laboratory testing.    AMPICILLIN/SULBACTAM Value in next row Resistant      16 SENSITIVEThis is a modified FDA-approved test that has been validated and its performance characteristics determined by the reporting laboratory.  This laboratory is certified under the Clinical Laboratory Improvement Amendments CLIA as qualified to perform high complexity clinical laboratory testing.    PIP/TAZO Value in next row Sensitive      <=4 SENSITIVEThis is a modified FDA-approved test that has been validated and its performance characteristics determined by the reporting laboratory.  This laboratory is certified under the Clinical Laboratory Improvement Amendments CLIA as qualified to perform high complexity clinical laboratory testing.    MEROPENEM Value in next row Sensitive      <=4 SENSITIVEThis is a modified FDA-approved test that has been validated and its performance characteristics determined by the reporting laboratory.  This laboratory is certified under the Clinical Laboratory Improvement Amendments CLIA as qualified to perform high complexity clinical laboratory testing.    * >=100,000 COLONIES/mL ESCHERICHIA COLI    Labs: CBC: Recent Labs  Lab 02/06/24 1622 02/08/24 0658 02/09/24 0600  WBC 5.1 5.0 5.6  HGB 12.2 11.3* 11.4*  HCT 37.9 35.7* 36.2  MCV 97.4 100.0 100.3*  PLT 255 237 232   Basic Metabolic Panel: Recent Labs  Lab 02/06/24 1622 02/08/24 0658 02/09/24 0600  NA 136 139 140  K 5.9* 5.7* 5.5*  CL 103 109 111  CO2 21* 19* 19*  GLUCOSE 89 86  103*  BUN 41* 23 15  CREATININE 2.86* 1.79* 1.47*  CALCIUM 9.9 9.2 9.2   Liver Function Tests: Recent Labs  Lab 02/06/24 1622  AST 23  ALT 14  ALKPHOS 75  BILITOT 0.5  PROT 7.8  ALBUMIN 4.0   CBG: Recent Labs  Lab 02/08/24 0749 02/08/24 1207 02/08/24 1652 02/08/24 2039 02/09/24 0734  GLUCAP 160* 146* 135* 138* 90    Discharge time spent: greater than 30 minutes.  Signed: Delon Herald, MD [facility] 02/09/2024 "

## 2024-02-09 NOTE — Assessment & Plan Note (Addendum)
 Holding amlodipine , hydralazine , losartan  for now BP up to 152/136 Resume amlodipine  and hydralazine 

## 2024-02-09 NOTE — Assessment & Plan Note (Addendum)
 At this time it is not possible to know if the patient is having UTI symptoms due to her dementia Urine culture + for E coli Will treat with Ceftriaxone -> Cefadroxil 500 mg BID for 7 total days

## 2024-02-09 NOTE — Plan of Care (Signed)
  Problem: Clinical Measurements: Goal: Diagnostic test results will improve Outcome: Progressing   Problem: Activity: Goal: Risk for activity intolerance will decrease Outcome: Progressing   Problem: Pain Managment: Goal: General experience of comfort will improve and/or be controlled Outcome: Progressing   Problem: Safety: Goal: Ability to remain free from injury will improve Outcome: Progressing   Problem: Skin Integrity: Goal: Risk for impaired skin integrity will decrease Outcome: Progressing

## 2024-02-09 NOTE — Assessment & Plan Note (Addendum)
"   Continue simvastatin          "

## 2024-02-09 NOTE — Assessment & Plan Note (Addendum)
 Baseline creatinine 1.5-1.6 earlier this month  Creatinine 2.86 on presentation Prerenal in etiology +/- component of ATN  Improved to 1.47 today, back to baseline

## 2024-02-09 NOTE — Assessment & Plan Note (Addendum)
 Likely due to antibiotic use - no clinical sx to suggest an acute infectious etiology at this time

## 2024-02-09 NOTE — Assessment & Plan Note (Addendum)
 Body mass index is 39.07 kg/m.SABRA  Weight loss should be encouraged Outpatient PCP/bariatric medicine f/u encouraged Significantly low or high BMI is associated with higher medical risk including morbidity and mortality

## 2024-02-09 NOTE — Assessment & Plan Note (Addendum)
 Acute delirium likely related to poor intake and dehydration in setting of abx associated diarrhea, as well as recurrent UTIs  She lives with her daughter, who feels comfortable caring for her She is back to baseline and ready for dc Continue Abilify , Depakote  sprinkles, Ativan 

## 2024-02-09 NOTE — Assessment & Plan Note (Addendum)
 Due to AKI  Slowly improving with hydration Anticipate ongoing improvement

## 2024-02-10 ENCOUNTER — Telehealth: Payer: Self-pay

## 2024-02-10 NOTE — Transitions of Care (Post Inpatient/ED Visit) (Signed)
" ° °  02/10/2024  Name: RHYSE SKOWRON MRN: [id number] DOB: 15-Jan-1941  Today's TOC FU Call Status: Today's TOC FU Call Status:: Unsuccessful Call (1st Attempt) Unsuccessful Call (1st Attempt) Date: 02/10/24  Attempted to reach the patient regarding the most recent Inpatient/ED visit.  Follow Up Plan: Additional outreach attempts will be made to reach the patient to complete the Transitions of Care (Post Inpatient/ED visit) call.   Alan Ee, RN, BSN, CEN Population Health- Transition of Care Team.  [facility] [phone]  "

## 2024-02-11 LAB — CULTURE, BLOOD (ROUTINE X 2)
Culture: NO GROWTH
Culture: NO GROWTH
Special Requests: ADEQUATE
Special Requests: ADEQUATE

## 2024-02-13 ENCOUNTER — Telehealth: Payer: Self-pay | Admitting: Pharmacist

## 2024-02-13 DIAGNOSIS — Z79899 Other long term (current) drug therapy: Secondary | ICD-10-CM

## 2024-02-13 NOTE — Progress Notes (Signed)
 "  02/13/2024 Name: Michelle Rowe MRN: [id number] DOB: [birth year]  Chief Complaint  Patient presents with   Medication Adherence    Michelle Rowe is a 84 y.o. year old female who presented for a telephone visit.   They were referred to the pharmacist by their PCP for assistance in managing medication adherence.    Subjective:  Care Team: Primary Care Provider: Leonarda Roxan BROCKS, NP ; Next Scheduled Visit: 04/23/2024   Medication Access/Adherence  Current Pharmacy:  [facility] [address], [place] - [address] [place] [address] Phone: [phone] Fax: [phone]   Patient reports affordability concerns with their medications: No  Patient reports access/transportation concerns to their pharmacy: No  Patient reports adherence concerns with their medications:  Yes  Patient's daughter said her mother does not like to swallow her medications. She parks them on the inside of her mouth instead.    Diabetes:  Current medications:   Glipizide  5 mg  Patient denies hypoglycemic s/sx including  dizziness, shakiness, sweating. Patient denies hyperglycemic symptoms including  polyuria, polydipsia, polyphagia, nocturia, neuropathy, blurred vision.  Macrovascular and Microvascular Risk Reduction:  Statin? yes (Simvastatin  10 mg); ACEi/ARB?    Lab Results  Component Value Date   HMDIABEYEEXA No Retinopathy 11/28/2017   Last foot exam: No foot exam found Tobacco Use:  Tobacco Use: Low Risk (02/07/2024)   Patient History    Smoking Tobacco Use: Never    Smokeless Tobacco Use: Never    Passive Exposure: Not on file     Objective:  Lab Results  Component Value Date   HGBA1C 6.5 (H) 02/08/2024    Lab Results  Component Value Date   CREATININE 1.47 (H) 02/09/2024   BUN 15 02/09/2024   NA 140 02/09/2024   K 5.5 (H) 02/09/2024   CL 111 02/09/2024   CO2 19 (L) 02/09/2024    Lab Results  Component Value Date   CHOL 156  10/26/2023   HDL 53 10/26/2023   LDLCALC 83 10/26/2023   TRIG 108 10/26/2023   CHOLHDL 2.9 10/26/2023    Medications Reviewed Today     Reviewed by Jolee Cassius PARAS, RPH (Pharmacist) on 02/13/24 at 1256  Med List Status: <None>   Medication Order Taking? Sig Documenting Provider Last Dose Status Informant  acetaminophen  (TYLENOL ) 325 MG tablet [id number] Yes Take 2 tablets (650 mg total) by mouth every 6 (six) hours as needed for mild pain (pain score 1-3) or fever (or Fever >/= 101). Barbarann Nest, MD  Active   allopurinol  (ZYLOPRIM ) 100 MG tablet [id number] Yes Take 1 tablet (100 mg total) by mouth daily. Ngetich, Roxan BROCKS, NP  Active Child, Pharmacy Records  amLODipine  (NORVASC ) 10 MG tablet [id number] Yes Take 1 tablet (10 mg total) by mouth daily. Needs an appointment before anymore future refills. Ngetich, Roxan BROCKS, NP  Active Child, Pharmacy Records  ARIPiprazole  (ABILIFY ) 2 MG tablet [id number] Yes Take 1 tablet (2 mg total) by mouth daily. Ngetich, [name] C, NP  Active Child, Pharmacy Records           Med Note MACK CASSIUS PARAS Pablo Feb 13, 2024 12:54 PM) At bedtime  aspirin  81 MG chewable tablet [id number] Yes Chew 1 tablet (81 mg total) by mouth daily. Ngetich, Roxan BROCKS, NP  Active Child, Pharmacy Records  azelastine  (ASTELIN ) 0.1 % nasal spray [id number] Yes Place 1 spray into both nostrils 2 (two) times daily. Use in each nostril as directed Ngetich,  Roxan BROCKS, NP  Active Child, Pharmacy Records  Blood Glucose Monitoring Suppl DEVI [id number] Yes 1 each by Does not apply route in the morning, at noon, and at bedtime. May substitute to any manufacturer covered by patient's insurance. Ngetich, Roxan BROCKS, NP  Active Child, Pharmacy Records  cefadroxil  (DURICEF) 500 MG capsule [id number] Yes Take 1 capsule (500 mg total) by mouth 2 (two) times daily for 6 days. Barbarann Nest, MD  Active   divalproex  (DEPAKOTE  SPRINKLE) 125 MG capsule [id number] Yes Take 2 capsules (250 mg total) by mouth 2 (two) times  daily. Gil Greig BRAVO, NP  Active Child, Pharmacy Records  EPINEPHrine  (EPIPEN  2-PAK) 0.3 mg/0.3 mL IJ SOAJ injection [id number] Yes Inject 0.3 mg into the muscle as needed for anaphylaxis. Ngetich, Roxan BROCKS, NP  Active Child, Pharmacy Records           Med Note CARLEEN, Casas D   Wed Feb 08, 2024  1:51 PM) Has at home - hasn't had to use.  fluticasone  (FLONASE ) 50 MCG/ACT nasal spray [id number] Yes USE 1 SPRAY(S) IN EACH NOSTRIL TWICE DAILY AS NEEDED FOR ALLERGIES Ngetich, [name] C, NP  Active Child, Pharmacy Records  glipiZIDE  (GLUCOTROL ) 5 MG tablet [id number] Yes Take 1 tablet (5 mg total) by mouth daily. Ngetich, Roxan BROCKS, NP  Active Child, Pharmacy Records  glucose blood test strip [id number] Yes 1 each by Other route as needed for other. Use as instructed Ngetich, [name] C, NP  Active Child, Pharmacy Records  guaiFENesin  (MUCINEX ) 600 MG 12 hr tablet [id number]  Take 1 tablet (600 mg total) by mouth 2 (two) times daily as needed.  Patient not taking: Reported on 02/13/2024   Ngetich, [name] C, NP  Active Child, Pharmacy Records  hydrALAZINE  (APRESOLINE ) 25 MG tablet [id number] Yes Take 1 tablet (25 mg total) by mouth 3 (three) times daily. Ngetich, Roxan BROCKS, NP  Active Child, Pharmacy Records  Lancets Ultra Thin 30G MISC [id number] Yes Inject 1 each into the skin daily. Ngetich, Roxan BROCKS, NP  Active Child, Pharmacy Records  loratadine  (CLARITIN ) 10 MG tablet [id number] Yes Take 1 tablet (10 mg total) by mouth daily as needed for allergies. Ngetich, Roxan BROCKS, NP  Active Child, Pharmacy Records  LORazepam  (ATIVAN ) 0.5 MG tablet [id number] Yes Take 1 tablet (0.5 mg total) by mouth 2 (two) times daily as needed. for anxiety Ngetich, [name] C, NP  Active Child, Pharmacy Records  losartan  (COZAAR ) 50 MG tablet [id number] Yes Take 1 tablet (50 mg total) by mouth daily. Ngetich, Roxan BROCKS, NP  Active Child, Pharmacy Records  simvastatin  (ZOCOR ) 10 MG tablet [id number] Yes Take 1 tablet (10 mg total) by mouth at bedtime.  Ngetich, Roxan BROCKS, NP  Active Child, Pharmacy Records              Assessment/Plan:   Reviewed Patient's medications with her daughter. She is currently crushing all of her chronic medications. None of them are on the do not crush list.  Glipizide  dose is currently 5 mg daily.  Given the Patient's age, GFR (at or less than 30 ml/min) HgA1c (6.5), --glipizide  dose could be decreased to 2.5 mg daily.  Her GFR has consisently been at or just a little below 30 ml/min.  Decreasing the dose will decrease the chance of hypoglycemia.    Patient's Daughter also wondered about getting something better to crush her mother's tablets with.   A stainless steel mortar and pestle will probably be the best and most economical option  Follow Up Plan:    Send note to Roxan Plough, NP about decreasing the glipizide  dose and liquid loratadine  (OTC).   Cassius DOROTHA Brought, PharmD, BCACP Clinical Pharmacist [phone]    "

## 2024-02-14 ENCOUNTER — Telehealth: Payer: Self-pay

## 2024-02-14 NOTE — Transitions of Care (Post Inpatient/ED Visit) (Signed)
" ° °  02/14/2024  Name: Michelle Rowe MRN: [id number] DOB: 08-23-1940  Today's TOC FU Call Status: Today's TOC FU Call Status:: Unsuccessful Call (2nd Attempt) Unsuccessful Call (2nd Attempt) Date: 02/14/24  Attempted to reach the patient regarding the most recent Inpatient/ED visit.  Follow Up Plan: Additional outreach attempts will be made to reach the patient to complete the Transitions of Care (Post Inpatient/ED visit) call.   Shona Prow RN, CCM [place]  VBCI-Population Health RN Care Manager [phone]  "

## 2024-02-22 ENCOUNTER — Telehealth: Payer: Self-pay | Admitting: Family

## 2024-02-22 DIAGNOSIS — E1122 Type 2 diabetes mellitus with diabetic chronic kidney disease: Secondary | ICD-10-CM

## 2024-02-22 MED ORDER — GLIPIZIDE 2.5 MG PO TABS: 2.5000 mg | ORAL_TABLET | Freq: Every day | ORAL | 1 refills | Status: AC

## 2024-02-22 NOTE — Telephone Encounter (Signed)
 Please call patient's POA and advised to please please glipizide  from 5 mg tablet to 2.5 mg tablet 1 by mouth daily to prevent low blood sugars.  Recent A1c 2 weeks ago was to 6.5. Notify provider for any blood sugars greater than 200

## 2024-02-23 NOTE — Telephone Encounter (Signed)
 Patient daughter Sharlet has been notified and voiced understanding.

## 2024-02-23 NOTE — Telephone Encounter (Signed)
 Left voice message.

## 2024-02-28 NOTE — Therapy (Incomplete)
 " OUTPATIENT OCCUPATIONAL THERAPY NEURO EVALUATION  Patient Name: Michelle Rowe MRN: [id number] DOB:Jan 30, 1941, 84 y.o., female Today's Date: 02/28/2024  PCP: Leonarda Roxan BROCKS, NP REFERRING PROVIDER: Gil Greig BRAVO, NP  END OF SESSION:   Past Medical History:  Diagnosis Date   Anterolisthesis    Grade 1 of L3 on L4 on x-ray 07/18/19   Aortic regurgitation    Seen on 2D echo 07/07/16   Cataract    CKD (chronic kidney disease), stage III (HCC)    DDD (degenerative disc disease), lumbar    Diabetes mellitus without complication (HCC)    Diastolic CHF (HCC)    Grade 2/4 with LVEF of 60-65%   Diastolic dysfunction    Grade 2/4 with LVEF of 60-65% and LVH   Gout    HLD (hyperlipidemia)    Hyperparathyroidism    Hypertension    Lumbar facet arthropathy    Memory impairment    Mitral valve regurgitation    Seen on 2D echo 07/07/16   OA (osteoarthritis)    OSA (obstructive sleep apnea)    Osteoarthritis    Polycythemia    Shingles    Sleep apnea    Thrombocytopenia    Vitamin D deficiency    Past Surgical History:  Procedure Laterality Date   LAPAROSCOPIC HYSTERECTOMY     Patient Active Problem List   Diagnosis Date Noted   DNR (do not resuscitate) 02/08/2024   Class 2 obesity due to excess calories with body mass index (BMI) of 39.0 to 39.9 in adult 02/08/2024   Hyperkalemia 02/08/2024   Diarrhea 02/08/2024   Abnormal urinalysis 02/08/2024   Acute renal failure (ARF) 02/07/2024   Acute renal failure superimposed on stage 3b chronic kidney disease (HCC) 02/06/2024   Dementia with behavioral disturbance (HCC) 01/20/2024   Rhabdomyolysis 01/20/2024   Fall at home, initial encounter 01/20/2024   Dementia (HCC) 01/20/2024   Obesity, morbid (HCC) 02/18/2023   Type 2 diabetes mellitus with stage 4 chronic kidney disease, without long-term current use of insulin  (HCC) 08/18/2022   Generalized anxiety disorder 08/18/2022   Hyperlipidemia LDL goal <70 08/18/2022   Chronic  diastolic congestive heart failure (HCC) 08/18/2022   Chronic pain of right knee 08/18/2022   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  Rationale for Evaluation and Treatment: {HABREHAB:27488}  SUBJECTIVE:   SUBJECTIVE STATEMENT: *** Pt accompanied by: {accompnied:27141}  PERTINENT HISTORY: ***  PRECAUTIONS: {Therapy precautions:24002}  WEIGHT BEARING RESTRICTIONS: {Yes ***/No:24003}  PAIN:  Are you having pain? {OPRCPAIN:27236}  FALLS: Has patient fallen in last 6 months? {fallsyesno:27318}  LIVING ENVIRONMENT: Lives with: {OPRC lives with:25569::lives with their family} Lives in: {Lives in:25570} Stairs: {opstairs:27293} Has following equipment at home: {Assistive devices:23999}  PLOF: {PLOF:24004}  PATIENT GOALS: ***  OBJECTIVE:  Note: Objective measures were completed at Evaluation unless otherwise noted.  HAND DOMINANCE: {MISC; OT HAND DOMINANCE:920 413 [id number]}  ADLs: Overall ADLs: *** Transfers/ambulation related to ADLs: Eating: *** Grooming: *** UB Dressing: *** LB Dressing: *** Toileting: ***  Bathing: *** Tub Shower transfers: *** Equipment: {equipment:25573}  IADLs: Shopping: *** Light housekeeping: *** Meal Prep: *** Community mobility: *** Medication management: *** Financial management: *** Handwriting: {OTWRITTENEXPRESSION:25361}  MOBILITY STATUS:  {OTMOBILITY:25360}  POSTURE COMMENTS:  {posture:25561} Sitting balance: {sitting balance:25483}  ACTIVITY TOLERANCE: Activity tolerance: ***  FUNCTIONAL OUTCOME MEASURES: {OTFUNCTIONALMEASURES:27238}  UPPER EXTREMITY ROM:    {AROM/PROM:27142} ROM Right eval Left eval  Shoulder flexion    Shoulder abduction    Shoulder adduction    Shoulder extension    Shoulder internal rotation    Shoulder external rotation    Elbow flexion    Elbow extension    Wrist flexion    Wrist extension    Wrist ulnar deviation    Wrist radial deviation    Wrist pronation    Wrist supination    (Blank rows = not tested)  UPPER EXTREMITY MMT:     MMT Right eval Left eval  Shoulder flexion    Shoulder abduction    Shoulder adduction    Shoulder extension    Shoulder internal rotation    Shoulder external rotation    Middle trapezius    Lower trapezius    Elbow flexion    Elbow extension    Wrist flexion    Wrist extension    Wrist ulnar deviation    Wrist radial deviation    Wrist pronation    Wrist supination    (Blank rows = not tested)  HAND FUNCTION: {handfunction:27230}  COORDINATION: {otcoordination:27237}  SENSATION: {sensation:27233}  EDEMA: ***  MUSCLE TONE: {UETONE:25567}  COGNITION: Overall cognitive status: {cognition:24006}  VISION: Subjective report: *** Baseline vision: {OTBASELINEVISION:25363} Visual history: {OTVISUALHISTORY:25364}  VISION ASSESSMENT: {visionassessment:27231}  Patient has difficulty with following activities due to following visual impairments: ***  PERCEPTION: {Perception:25564}  PRAXIS: {Praxis:25565}  OBSERVATIONS: ***                                                                                                                             TREATMENT DATE: ***         PATIENT EDUCATION: Education details: *** Person educated: {Person educated:25204} Education method: {Education Method:25205} Education  comprehension: {Education Comprehension:25206}  HOME EXERCISE PROGRAM: ***   GOALS: Goals reviewed with patient? {yes/no:20286}  SHORT TERM GOALS: Target date: ***  *** Baseline: Goal status: INITIAL  2.  *** Baseline:  Goal status: INITIAL  3.  *** Baseline:  Goal status: INITIAL  4.  *** Baseline:  Goal status: INITIAL  5.  *** Baseline:  Goal status: INITIAL  6.  *** Baseline:  Goal status: INITIAL  LONG TERM GOALS: Target date: ***  *** Baseline:  Goal status: INITIAL  2.  *** Baseline:  Goal status: INITIAL  3.  *** Baseline:  Goal status: INITIAL  4.  *** Baseline:  Goal status: INITIAL  5.  *** Baseline:  Goal status: INITIAL  6.  *** Baseline:  Goal status: INITIAL  ASSESSMENT:  CLINICAL IMPRESSION: Patient is  a 84 y.o. female who was seen today for occupational therapy evaluation for late onset Alzheimer's without behavioral disturbance and unsteady gait. Hx includes R knee pain causing difficulty to get in the car when riding for appts. Patient currently presents *** baseline level of functioning demonstrating functional deficits and impairments as noted below. Pt would benefit from skilled OT services in the outpatient setting to work on impairments as noted below to help pt return to PLOF as able.    PERFORMANCE DEFICITS: in functional skills including {OT physical skills:25468}, cognitive skills including {OT cognitive skills:25469}, and psychosocial skills including {OT psychosocial skills:25470}.   IMPAIRMENTS: are limiting patient from {OT performance deficits:25471}.   CO-MORBIDITIES: {Comorbidities:25485} that affects occupational performance. Patient will benefit from skilled OT to address above impairments and improve overall function.  MODIFICATION OR ASSISTANCE TO COMPLETE EVALUATION: {OT modification:25474}  OT OCCUPATIONAL PROFILE AND HISTORY: {OT PROFILE AND HISTORY:25484}  CLINICAL DECISION MAKING: {OT  CDM:25475}  REHAB POTENTIAL: {rehabpotential:25112}  EVALUATION COMPLEXITY: {Evaluation complexity:25115}    PLAN:  OT FREQUENCY: {rehab frequency:25116}  OT DURATION: {rehab duration:25117}  PLANNED INTERVENTIONS: {OT Interventions:25467}  RECOMMENDED OTHER SERVICES: ***  CONSULTED AND AGREED WITH PLAN OF CARE: {ENR:74513}  PLAN FOR NEXT SESSION: ***   Rocky Dutch, OT 02/28/2024, 8:06 AM           "

## 2024-02-29 ENCOUNTER — Ambulatory Visit: Attending: Orthopedic Surgery

## 2024-03-05 ENCOUNTER — Other Ambulatory Visit: Payer: Self-pay | Admitting: Family

## 2024-03-05 DIAGNOSIS — M1A9XX0 Chronic gout, unspecified, without tophus (tophi): Secondary | ICD-10-CM

## 2024-03-06 ENCOUNTER — Telehealth: Payer: Self-pay

## 2024-03-06 NOTE — Telephone Encounter (Signed)
 Copied from CRM #[id number]. Topic: General - Other >> Mar 05, 2024  1:39 PM Diannia H wrote: Reason for CRM: Patients daughter is calling about her letter she is needing for her mom the patient about being her caretaker. Could you assist? She is just needing the date update on the letter. Callback number is [phone].

## 2024-03-06 NOTE — Telephone Encounter (Signed)
 Letter write and given to front desk Admin staff.please call patient's daughter to pickup letter.

## 2024-03-06 NOTE — Telephone Encounter (Signed)
 Please advise...  Copied from CRM #[id number]. Topic: General - Other >> Mar 06, 2024  9:26 AM Michelle Rowe wrote: Reason for CRM: need the letter from the provider that  daughter is patient  soul care giver need to updated ,need  new date , Daughter ask to speak with Michelle Rowe or anyone else that can answer , daughter stated she herself is in [place] right now , daughter will be able to pick up today while she is in [place]    Michelle Rowe Daughter [phone]

## 2024-03-07 NOTE — Telephone Encounter (Signed)
 Noted.

## 2024-03-09 ENCOUNTER — Telehealth: Payer: Self-pay | Admitting: Pharmacist

## 2024-03-09 DIAGNOSIS — E1122 Type 2 diabetes mellitus with diabetic chronic kidney disease: Secondary | ICD-10-CM

## 2024-03-09 NOTE — Progress Notes (Signed)
" ° °  03/09/2024 Name: NGINA ROYER MRN: [id number] DOB: 06/14/1940  Chief Complaint  Patient presents with   Medication Adherence   Osteoarthritis    Glipizide     Michelle Rowe is a 84 y.o. year old female who presented for a telephone visit.   They were referred to the pharmacist by their PCP for assistance in managing medication access. Patient is being cared for by her daughter, Reygan Heagle.  I spoke with Sharlet today. HIPAA identifiers were obtained.  During our last visit, the Patient's medication list was reviewed to be sure none of her medications were on the do not crush list.  It was also discovered that her glipizide  dose needed to be decreased from 5mg  to 2.5 mg daily.  Patient's provider sent the prescription in on 02/22/2024. Sharlet said they did not pick up the prescription.  [facility] was called.  The representative said Glipizide  2.5 mg was on hold.  They were asked to get the Glipizide  2.5 mg tablets ready and that her dose had been officially decreased.  Sharlet said they would pick up the glipizide  later this week.   Plan: Follow up on them picking up glipizide .   Cassius DOROTHA Brought, PharmD, BCACP Clinical Pharmacist [phone]    Subjective:  Care Team: Primary Care Provider: Leonarda Roxan BROCKS, NP ; Next Scheduled Visit: 04/24/2024  Medication Access/Adherence  Current Pharmacy:  [facility] [address], [place] - [address] [place] [address] Phone: [phone] Fax: [phone]   Patient reports affordability concerns with their medications: No  Patient reports access/transportation concerns to their pharmacy: No  Patient reports adherence concerns with their medications:  No         Objective:  Lab Results  Component Value Date   HGBA1C 6.5 (H) 02/08/2024    Lab Results  Component Value Date   CREATININE 1.47 (H) 02/09/2024   BUN 15 02/09/2024   NA 140 02/09/2024   K 5.5 (H) 02/09/2024    CL 111 02/09/2024   CO2 19 (L) 02/09/2024    Lab Results  Component Value Date   CHOL 156 10/26/2023   HDL 53 10/26/2023   LDLCALC 83 10/26/2023   TRIG 108 10/26/2023   CHOLHDL 2.9 10/26/2023   Cassius DOROTHA Brought, PharmD, BCACP Clinical Pharmacist [phone]      "

## 2024-03-23 ENCOUNTER — Ambulatory Visit: Admitting: Occupational Therapy

## 2024-04-24 ENCOUNTER — Ambulatory Visit: Payer: Self-pay | Admitting: Family
